# Patient Record
Sex: Male | Born: 1955 | Hispanic: Yes | Marital: Married
Health system: Southern US, Academic
[De-identification: ages and names within clinical notes are randomized; demographics above are authoritative.]

## PROBLEM LIST (undated history)

## (undated) ENCOUNTER — Encounter

## (undated) ENCOUNTER — Telehealth

## (undated) ENCOUNTER — Ambulatory Visit: Payer: MEDICARE | Attending: Registered" | Primary: Registered"

## (undated) ENCOUNTER — Ambulatory Visit

## (undated) ENCOUNTER — Ambulatory Visit: Payer: MEDICARE | Attending: Nephrology | Primary: Nephrology

## (undated) ENCOUNTER — Encounter: Attending: Physician Assistant | Primary: Physician Assistant

## (undated) ENCOUNTER — Encounter: Attending: Nephrology | Primary: Nephrology

## (undated) ENCOUNTER — Ambulatory Visit: Payer: MEDICARE

## (undated) ENCOUNTER — Telehealth: Payer: MEDICARE | Attending: Nephrology | Primary: Nephrology

## (undated) ENCOUNTER — Encounter
Attending: Pharmacist Clinician (PhC)/ Clinical Pharmacy Specialist | Primary: Pharmacist Clinician (PhC)/ Clinical Pharmacy Specialist

## (undated) ENCOUNTER — Institutional Professional Consult (permissible substitution): Payer: MEDICARE | Attending: Nephrology | Primary: Nephrology

## (undated) ENCOUNTER — Telehealth: Attending: Internal Medicine | Primary: Internal Medicine

## (undated) ENCOUNTER — Telehealth: Attending: Nephrology | Primary: Nephrology

## (undated) DIAGNOSIS — H409 Unspecified glaucoma: Secondary | ICD-10-CM

## (undated) DIAGNOSIS — E7800 Pure hypercholesterolemia, unspecified: Secondary | ICD-10-CM

## (undated) DIAGNOSIS — I10 Essential (primary) hypertension: Secondary | ICD-10-CM

## (undated) DIAGNOSIS — M109 Gout, unspecified: Secondary | ICD-10-CM

## (undated) DIAGNOSIS — D631 Anemia in chronic kidney disease: Secondary | ICD-10-CM

## (undated) DIAGNOSIS — N189 Chronic kidney disease, unspecified: Secondary | ICD-10-CM

## (undated) DIAGNOSIS — N2889 Other specified disorders of kidney and ureter: Secondary | ICD-10-CM

## (undated) DIAGNOSIS — N186 End stage renal disease: Secondary | ICD-10-CM

## (undated) HISTORY — DX: Chronic kidney disease, unspecified: N18.9

## (undated) HISTORY — DX: Other specified disorders of kidney and ureter: N28.89

## (undated) HISTORY — DX: Chronic kidney disease, unspecified: D63.1

## (undated) HISTORY — DX: Unspecified glaucoma: H40.9

## (undated) HISTORY — DX: Gout, unspecified: M10.9

## (undated) HISTORY — DX: End stage renal disease: N18.6

## (undated) HISTORY — PX: NEPHRECTOMY: SHX65

## (undated) HISTORY — PX: OTHER SURGICAL HISTORY: SHX169

## (undated) HISTORY — PX: KIDNEY TRANSPLANT: SHX239

## (undated) HISTORY — DX: Essential (primary) hypertension: I10

## (undated) MED ORDER — COMBIGAN 0.2 %-0.5 % EYE DROPS: Freq: Two times a day (BID) | OPHTHALMIC | 0 days

## (undated) MED ORDER — LATANOPROST 0.005 % EYE DROPS: Freq: Every evening | OPHTHALMIC | 0.00000 days

---
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
## 2004-07-09 HISTORY — PX: CARDIAC CATHETERIZATION: SHX172

## 2004-07-20 ENCOUNTER — Other Ambulatory Visit: Payer: Self-pay

## 2004-07-20 ENCOUNTER — Emergency Department: Payer: Self-pay | Admitting: Emergency Medicine

## 2004-07-28 ENCOUNTER — Ambulatory Visit: Payer: Self-pay | Admitting: Cardiovascular Disease

## 2006-02-12 ENCOUNTER — Emergency Department: Payer: Self-pay | Admitting: Emergency Medicine

## 2006-07-27 ENCOUNTER — Emergency Department: Payer: Self-pay | Admitting: Emergency Medicine

## 2006-11-28 ENCOUNTER — Ambulatory Visit: Payer: Self-pay | Admitting: Family Medicine

## 2007-11-02 ENCOUNTER — Emergency Department: Payer: Self-pay | Admitting: Emergency Medicine

## 2007-11-05 ENCOUNTER — Encounter: Payer: Self-pay | Admitting: Emergency Medicine

## 2007-11-07 ENCOUNTER — Encounter: Payer: Self-pay | Admitting: Emergency Medicine

## 2008-02-09 ENCOUNTER — Emergency Department: Payer: Self-pay | Admitting: Internal Medicine

## 2008-04-22 ENCOUNTER — Emergency Department: Payer: Self-pay | Admitting: Emergency Medicine

## 2008-05-04 ENCOUNTER — Emergency Department: Payer: Self-pay | Admitting: Emergency Medicine

## 2009-06-04 ENCOUNTER — Emergency Department: Payer: Self-pay | Admitting: Unknown Physician Specialty

## 2009-07-18 ENCOUNTER — Emergency Department: Payer: Self-pay | Admitting: Emergency Medicine

## 2010-09-07 ENCOUNTER — Inpatient Hospital Stay: Payer: Self-pay | Admitting: Internal Medicine

## 2010-09-25 ENCOUNTER — Ambulatory Visit: Payer: Self-pay | Admitting: Vascular Surgery

## 2010-09-27 ENCOUNTER — Ambulatory Visit: Payer: Self-pay | Admitting: Vascular Surgery

## 2010-11-02 ENCOUNTER — Emergency Department: Payer: Self-pay | Admitting: Emergency Medicine

## 2010-12-01 ENCOUNTER — Ambulatory Visit: Payer: Self-pay | Admitting: Vascular Surgery

## 2010-12-31 ENCOUNTER — Emergency Department: Payer: Self-pay | Admitting: Emergency Medicine

## 2011-01-01 ENCOUNTER — Emergency Department: Payer: Self-pay | Admitting: Emergency Medicine

## 2011-02-19 ENCOUNTER — Ambulatory Visit: Payer: Self-pay | Admitting: Vascular Surgery

## 2012-01-04 ENCOUNTER — Ambulatory Visit: Payer: Self-pay | Admitting: Vascular Surgery

## 2012-03-12 ENCOUNTER — Ambulatory Visit: Payer: Self-pay | Admitting: Vascular Surgery

## 2012-04-07 ENCOUNTER — Ambulatory Visit: Payer: Self-pay | Admitting: Vascular Surgery

## 2012-06-17 ENCOUNTER — Observation Stay: Payer: Self-pay | Admitting: Internal Medicine

## 2012-06-17 LAB — COMPREHENSIVE METABOLIC PANEL
Albumin: 3.6 g/dL (ref 3.4–5.0)
Alkaline Phosphatase: 135 U/L (ref 50–136)
Calcium, Total: 7.7 mg/dL — ABNORMAL LOW (ref 8.5–10.1)
Co2: 22 mmol/L (ref 21–32)
Glucose: 97 mg/dL (ref 65–99)
Osmolality: 299 (ref 275–301)
Potassium: 5.1 mmol/L (ref 3.5–5.1)
SGOT(AST): 27 U/L (ref 15–37)
Sodium: 140 mmol/L (ref 136–145)

## 2012-06-17 LAB — CK TOTAL AND CKMB (NOT AT ARMC)
CK, Total: 294 U/L — ABNORMAL HIGH (ref 35–232)
CK, Total: 263 U/L — ABNORMAL HIGH (ref 35–232)
CK-MB: 1.6 ng/mL (ref 0.5–3.6)
CK-MB: 0.7 ng/mL (ref 0.5–3.6)

## 2012-06-17 LAB — URINALYSIS, COMPLETE
Protein: 100
Specific Gravity: 1.009 (ref 1.003–1.030)

## 2012-06-17 LAB — CBC WITH DIFFERENTIAL/PLATELET
Basophil #: 0.1 10*3/uL (ref 0.0–0.1)
Eosinophil %: 5.6 %
HCT: 20.5 % — ABNORMAL LOW (ref 40.0–52.0)
HGB: 7 g/dL — ABNORMAL LOW (ref 13.0–18.0)
Lymphocyte #: 1 10*3/uL (ref 1.0–3.6)
MCH: 32.5 pg (ref 26.0–34.0)
MCV: 95 fL (ref 80–100)
Monocyte #: 0.4 x10 3/mm (ref 0.2–1.0)
Monocyte %: 4.4 %
Neutrophil #: 6.4 10*3/uL (ref 1.4–6.5)
RDW: 13.4 % (ref 11.5–14.5)

## 2012-06-17 LAB — TROPONIN I: Troponin-I: <0.02 ng/mL

## 2012-06-17 LAB — PROTIME-INR: Prothrombin Time: 12.9 secs (ref 11.5–14.7)

## 2012-06-17 LAB — PHOSPHORUS: Phosphorus: 3.3 mg/dL (ref 2.5–4.9)

## 2012-06-18 LAB — CBC WITH DIFFERENTIAL/PLATELET
Basophil #: 0.1 10*3/uL (ref 0.0–0.1)
Eosinophil #: 0.4 10*3/uL (ref 0.0–0.7)
Eosinophil %: 6.1 %
HGB: 8.2 g/dL — ABNORMAL LOW (ref 13.0–18.0)
Lymphocyte #: 1.1 10*3/uL (ref 1.0–3.6)
Lymphocyte %: 15.9 %
MCHC: 36.7 g/dL — ABNORMAL HIGH (ref 32.0–36.0)
MCV: 93 fL (ref 80–100)
Monocyte %: 8.1 %
Neutrophil %: 69 %
Platelet: 146 10*3/uL — ABNORMAL LOW (ref 150–440)
RDW: 14.5 % (ref 11.5–14.5)
WBC: 7.2 10*3/uL (ref 3.8–10.6)

## 2012-06-18 LAB — BASIC METABOLIC PANEL
Anion Gap: 9 (ref 7–16)
BUN: 36 mg/dL — ABNORMAL HIGH (ref 7–18)
Calcium, Total: 7.6 mg/dL — ABNORMAL LOW (ref 8.5–10.1)
Chloride: 100 mmol/L (ref 98–107)
Co2: 29 mmol/L (ref 21–32)
Creatinine: 8.18 mg/dL — ABNORMAL HIGH (ref 0.60–1.30)
Glucose: 99 mg/dL (ref 65–99)
Potassium: 4.2 mmol/L (ref 3.5–5.1)

## 2012-06-19 ENCOUNTER — Encounter: Payer: Self-pay | Admitting: *Deleted

## 2012-06-20 ENCOUNTER — Ambulatory Visit: Payer: Self-pay | Admitting: Cardiovascular Disease

## 2012-10-31 ENCOUNTER — Ambulatory Visit: Payer: Self-pay | Admitting: Family Medicine

## 2012-12-04 ENCOUNTER — Inpatient Hospital Stay: Payer: Self-pay | Admitting: Internal Medicine

## 2012-12-04 DIAGNOSIS — I4892 Unspecified atrial flutter: Secondary | ICD-10-CM

## 2012-12-04 DIAGNOSIS — I5043 Acute on chronic combined systolic (congestive) and diastolic (congestive) heart failure: Secondary | ICD-10-CM

## 2012-12-04 LAB — CBC
HCT: 23 % — ABNORMAL LOW (ref 40.0–52.0)
MCH: 32.4 pg (ref 26.0–34.0)
MCV: 97 fL (ref 80–100)
Platelet: 149 10*3/uL — ABNORMAL LOW (ref 150–440)
RDW: 14 % (ref 11.5–14.5)
WBC: 6.4 10*3/uL (ref 3.8–10.6)

## 2012-12-04 LAB — PROTIME-INR
INR: 1
Prothrombin Time: 13.4 secs (ref 11.5–14.7)

## 2012-12-04 LAB — COMPREHENSIVE METABOLIC PANEL
Alkaline Phosphatase: 118 U/L (ref 50–136)
Anion Gap: 8 (ref 7–16)
BUN: 37 mg/dL — ABNORMAL HIGH (ref 7–18)
Co2: 26 mmol/L (ref 21–32)
Creatinine: 6.45 mg/dL — ABNORMAL HIGH (ref 0.60–1.30)
Glucose: 112 mg/dL — ABNORMAL HIGH (ref 65–99)
Osmolality: 285 (ref 275–301)
Potassium: 4.3 mmol/L (ref 3.5–5.1)
SGOT(AST): 38 U/L — ABNORMAL HIGH (ref 15–37)
Sodium: 138 mmol/L (ref 136–145)
Total Protein: 6.4 g/dL (ref 6.4–8.2)

## 2012-12-04 LAB — TROPONIN I
Troponin-I: 0.06 ng/mL — ABNORMAL HIGH
Troponin-I: 0.12 ng/mL — ABNORMAL HIGH

## 2012-12-04 LAB — APTT: Activated PTT: 52.1 secs — ABNORMAL HIGH (ref 23.6–35.9)

## 2012-12-04 LAB — CK TOTAL AND CKMB (NOT AT ARMC)
CK, Total: 209 U/L (ref 35–232)
CK-MB: 1.6 ng/mL (ref 0.5–3.6)

## 2012-12-04 LAB — TSH: Thyroid Stimulating Horm: 1.62 u[IU]/mL

## 2012-12-04 LAB — CK-MB: CK-MB: 1.3 ng/mL (ref 0.5–3.6)

## 2012-12-05 DIAGNOSIS — I4892 Unspecified atrial flutter: Secondary | ICD-10-CM

## 2012-12-05 DIAGNOSIS — I059 Rheumatic mitral valve disease, unspecified: Secondary | ICD-10-CM

## 2012-12-05 LAB — CBC WITH DIFFERENTIAL/PLATELET
Basophil #: 0.1 10*3/uL (ref 0.0–0.1)
Basophil %: 1.7 %
Eosinophil #: 0.4 10*3/uL (ref 0.0–0.7)
Eosinophil %: 5.3 %
HCT: 21.4 % — ABNORMAL LOW (ref 40.0–52.0)
HGB: 7.5 g/dL — ABNORMAL LOW (ref 13.0–18.0)
MCH: 33.6 pg (ref 26.0–34.0)
MCV: 96 fL (ref 80–100)
Monocyte #: 0.5 x10 3/mm (ref 0.2–1.0)
Monocyte %: 6.8 %
Neutrophil %: 68.1 %
Platelet: 144 10*3/uL — ABNORMAL LOW (ref 150–440)
RBC: 2.22 10*6/uL — ABNORMAL LOW (ref 4.40–5.90)
WBC: 6.8 10*3/uL (ref 3.8–10.6)

## 2012-12-05 LAB — BASIC METABOLIC PANEL
Chloride: 104 mmol/L (ref 98–107)
Creatinine: 8.92 mg/dL — ABNORMAL HIGH (ref 0.60–1.30)
EGFR (African American): 7 — ABNORMAL LOW
EGFR (Non-African Amer.): 6 — ABNORMAL LOW
Glucose: 91 mg/dL (ref 65–99)
Osmolality: 286 (ref 275–301)

## 2012-12-05 LAB — APTT
Activated PTT: 64.2 secs — ABNORMAL HIGH (ref 23.6–35.9)
Activated PTT: 87.9 secs — ABNORMAL HIGH (ref 23.6–35.9)

## 2012-12-05 LAB — CK-MB: CK-MB: 1.2 ng/mL (ref 0.5–3.6)

## 2012-12-05 LAB — MAGNESIUM: Magnesium: 1.8 mg/dL

## 2012-12-05 LAB — TROPONIN I: Troponin-I: 0.12 ng/mL — ABNORMAL HIGH

## 2012-12-06 LAB — PROTIME-INR
INR: 1.1
Prothrombin Time: 14.4 secs (ref 11.5–14.7)

## 2012-12-06 LAB — CBC WITH DIFFERENTIAL/PLATELET
Basophil #: 0.1 10*3/uL (ref 0.0–0.1)
Basophil %: 1.3 %
Eosinophil #: 0.5 10*3/uL (ref 0.0–0.7)
Eosinophil %: 5 %
HGB: 8.4 g/dL — ABNORMAL LOW (ref 13.0–18.0)
MCHC: 34.7 g/dL (ref 32.0–36.0)
MCV: 97 fL (ref 80–100)
Monocyte #: 0.5 x10 3/mm (ref 0.2–1.0)
Monocyte %: 5.4 %
Platelet: 171 10*3/uL (ref 150–440)
RDW: 13.8 % (ref 11.5–14.5)

## 2012-12-06 LAB — APTT
Activated PTT: 114.7 secs — ABNORMAL HIGH (ref 23.6–35.9)
Activated PTT: 87.9 secs — ABNORMAL HIGH (ref 23.6–35.9)

## 2012-12-07 LAB — APTT: Activated PTT: 75.9 secs — ABNORMAL HIGH (ref 23.6–35.9)

## 2012-12-07 LAB — BASIC METABOLIC PANEL
Anion Gap: 7 (ref 7–16)
Calcium, Total: 6.9 mg/dL — CL (ref 8.5–10.1)
Creatinine: 7.21 mg/dL — ABNORMAL HIGH (ref 0.60–1.30)
Osmolality: 280 (ref 275–301)
Potassium: 4.4 mmol/L (ref 3.5–5.1)
Sodium: 137 mmol/L (ref 136–145)

## 2012-12-30 ENCOUNTER — Encounter: Payer: Self-pay | Admitting: *Deleted

## 2012-12-31 ENCOUNTER — Encounter: Payer: Self-pay | Admitting: *Deleted

## 2013-01-12 ENCOUNTER — Encounter: Payer: Self-pay | Admitting: *Deleted

## 2013-01-12 ENCOUNTER — Ambulatory Visit: Payer: Medicare Other | Admitting: Cardiovascular Disease

## 2013-08-12 IMAGING — CR DG CHEST 1V PORT
1 series · 2 of 2 positions shown · non-contrast
Comparison: none

REASON FOR EXAM: Chest Pain
COMMENTS:

[Series 1: ap · 0.17mm/px · 2 of 2 slices shown]
[im 1/2]
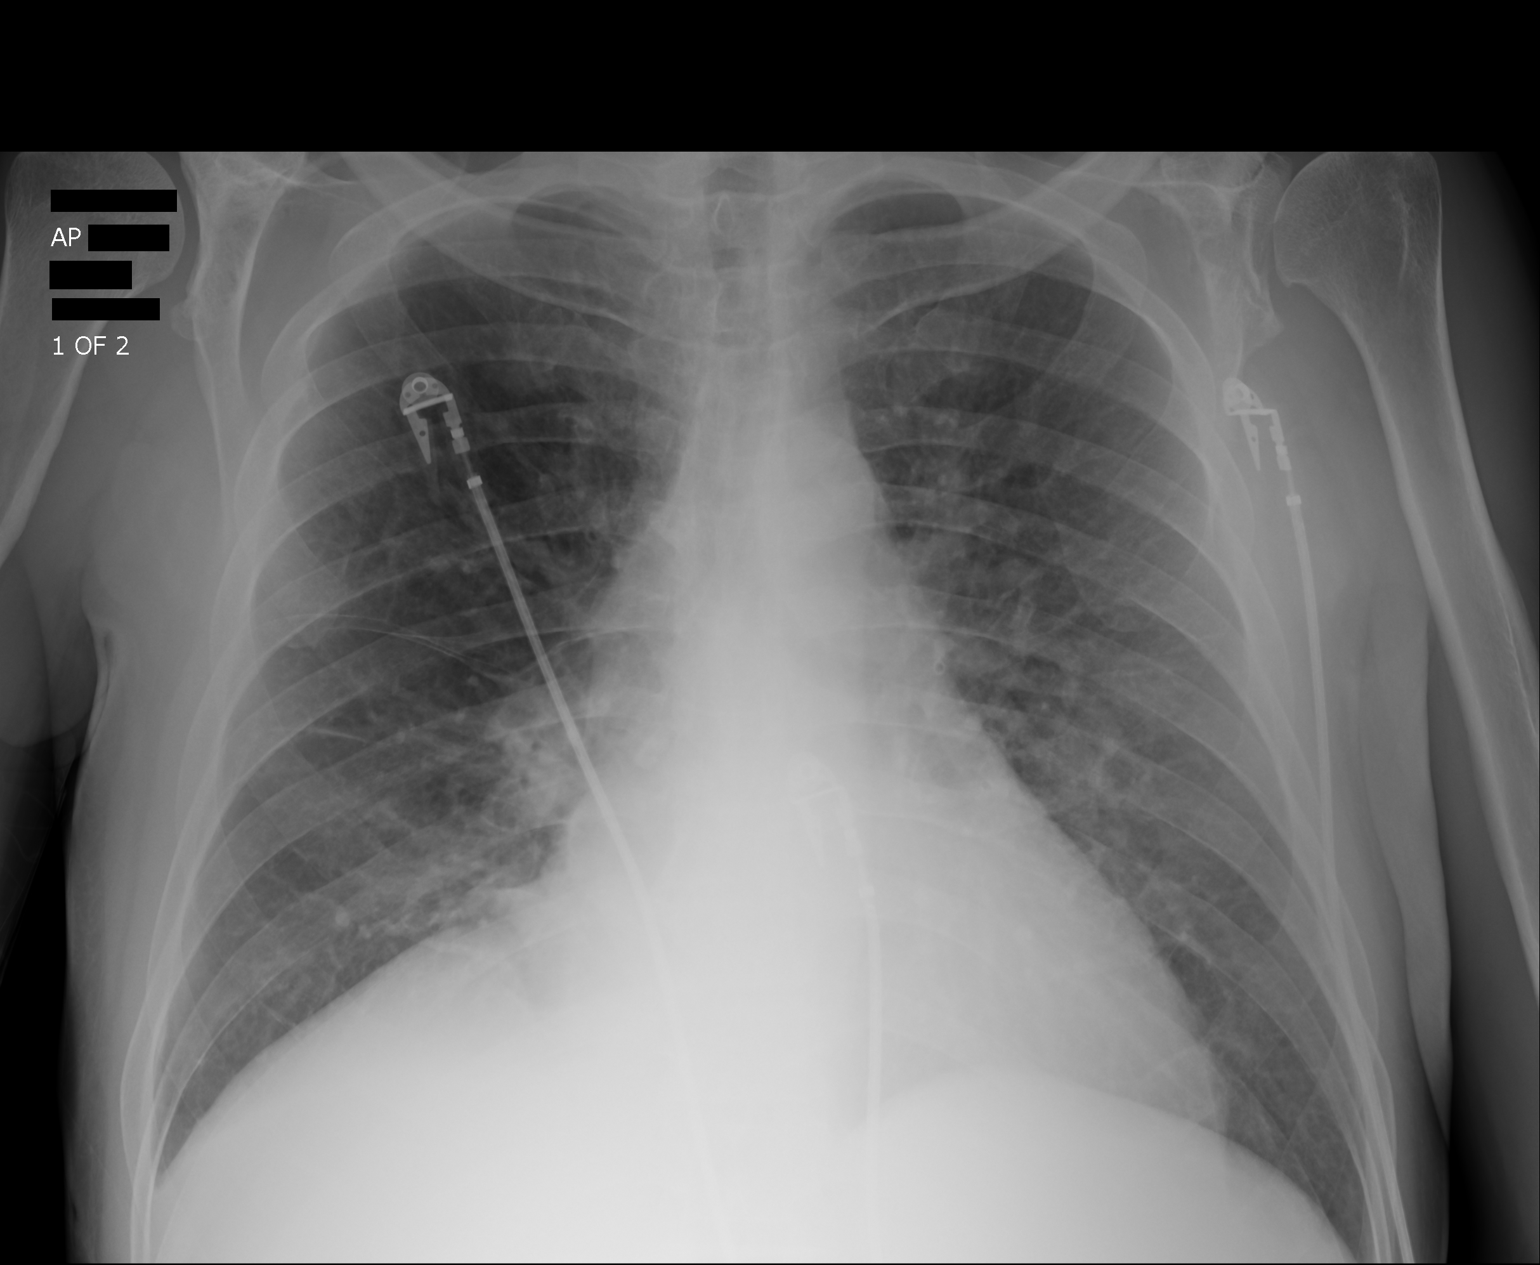
[im 2/2]
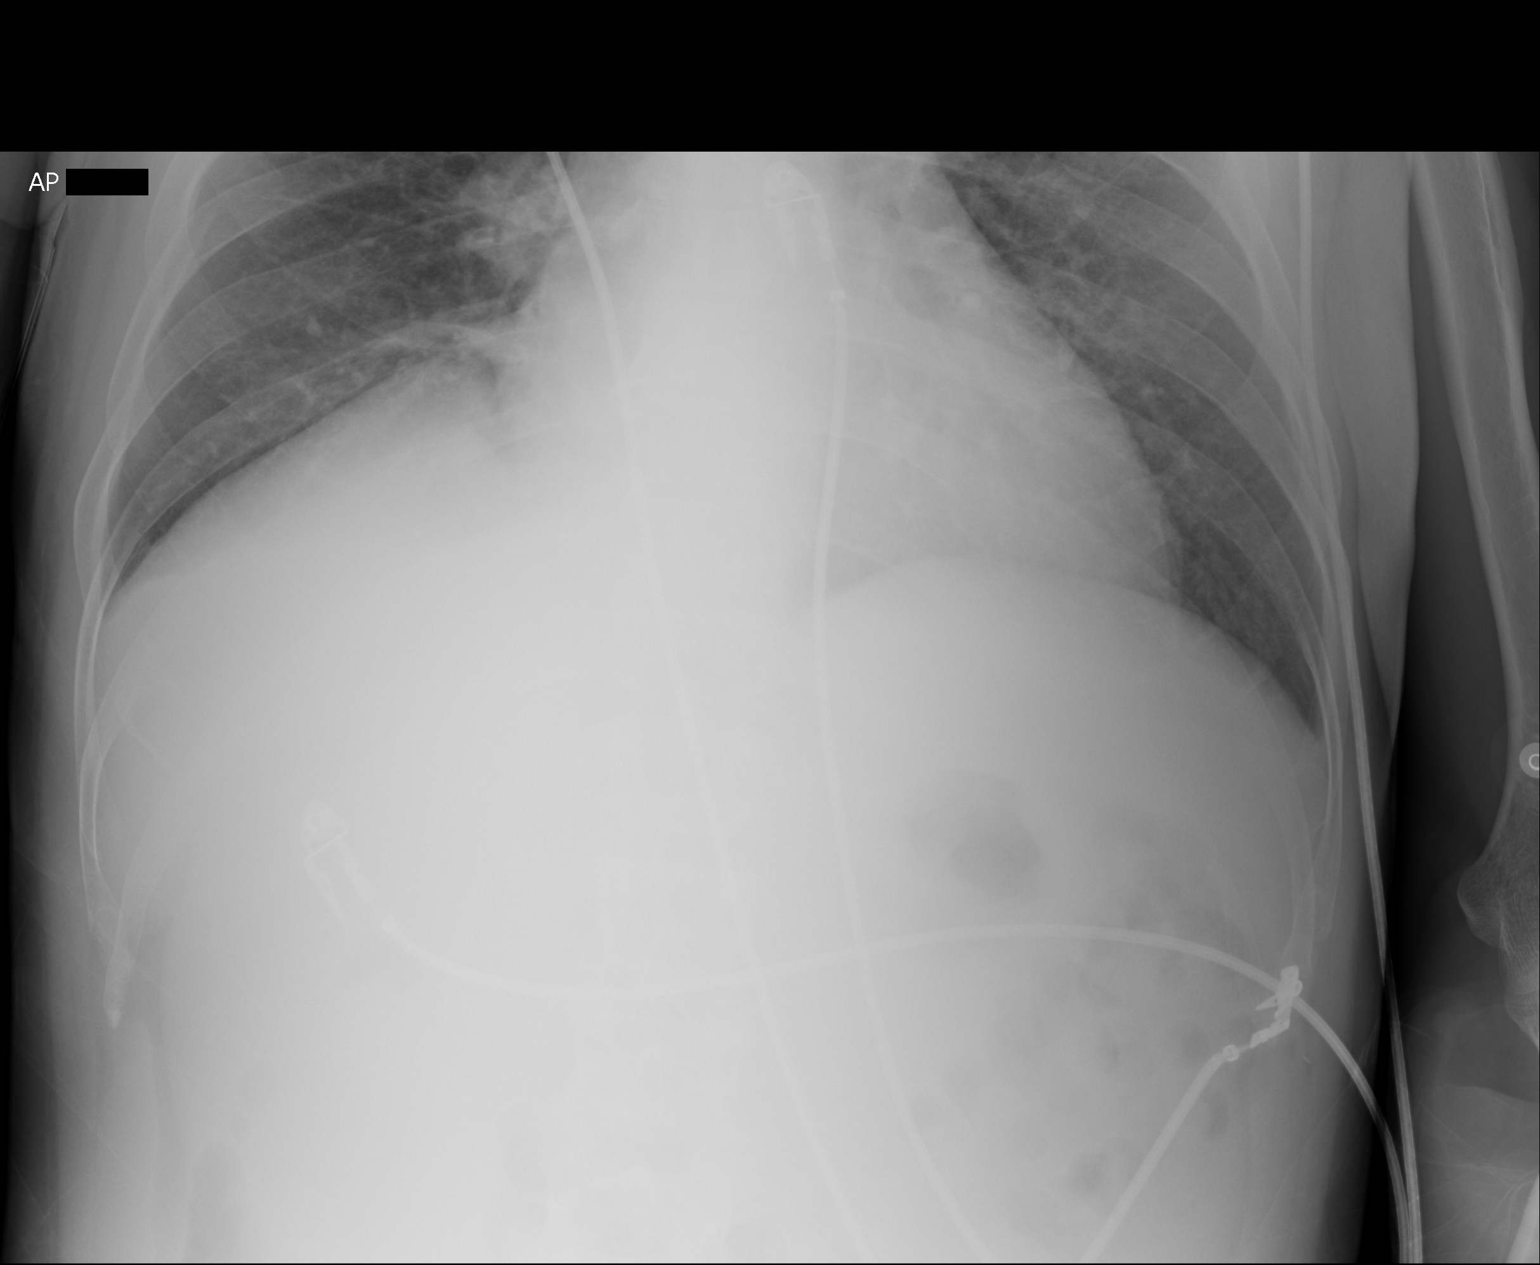

[2 of 2 positions shown; findings below may reference images not displayed]

PROCEDURE:     DXR - DXR PORTABLE CHEST SINGLE VIEW  - December 04, 2012 [DATE]

RESULT:     Comparison is made to the previous study of 10/31/2012. The
cardiac silhouette is normal. There is no edema, infiltrate, effusion, mass
or pneumothorax. There is no evidence of blunting of the costophrenic angles
to suggest effusion.
IMPRESSION: 1. No evidence of pulmonary edema or pleural effusion. No acute
cardiopulmonary disease evident.

[REDACTED]

## 2014-09-03 ENCOUNTER — Ambulatory Visit: Payer: Self-pay | Admitting: Family Medicine

## 2014-10-26 NOTE — Op Note (Signed)
PATIENT NAME:  Christopher Hickman, Christopher Hickman MR#:  [id number] DATE OF BIRTH:  1955-07-15  DATE OF PROCEDURE:  03/12/2012  PREOPERATIVE DIAGNOSES:  1. End-stage renal disease.  2. Poorly functioning left arm AV fistula.   POSTOPERATIVE DIAGNOSES: 1. End-stage renal disease.  2. Poorly functioning left arm AV fistula.   PROCEDURES:  1. Ultrasound guidance for vascular access to left arm AV fistula.  2. Left upper extremity fistulogram.  3. Percutaneous transluminal angioplasty of peri anastomotic stenosis with 6 and 7 mm diameter angioplasty balloon.   SURGEON: [name], MD   ANESTHESIA: Local with moderate conscious sedation.   ESTIMATED BLOOD LOSS: Minimal.   FLUOROSCOPY TIME: Approximately two minutes.   CONTRAST USED: 15 mL.   INDICATION FOR PROCEDURE: This is a 59 year old Hispanic male with end-stage renal disease. His fistula flows have been down recently. We are asked to evaluate this with fistulogram.   DESCRIPTION OF PROCEDURE: The patient is brought to the Vascular Interventional Radiology Suite. Left upper extremity was sterilely prepped and draped and a sterile surgical field was created. The fistula was accessed just below the antecubital fossa in a retrograde direction without difficulty with a micropuncture needle and permanent image was recorded. Micropuncture wire and sheath were placed and we upsized to a 6 [place] sheath. A Kumpe catheter was placed at the anastomosis which showed a moderate stenosis in the 50 to 60% range just beyond the anastomosis. The remainder of the fistula appeared patent. There were small to medium-sized branches in two locations in the forearm that connected to a single outflow vein that were likely limiting some of the flow, however, from a retrograde direction the orientation was not ideal for treatment of these today and these would be better treated from an access near the anastomosis. I elected to treat the stenotic area and 6 and 7 mm diameter  angioplasty balloons were used and following angioplasty there was a mild residual stenosis of less than 30% and I elected to terminate the procedure. The sheath was removed around a 4-0 Monocryl purse-string suture. Pressure was held. Sterile dressing was placed. The patient tolerated the procedure well and was taken to the recovery room in stable condition.    ____________________________ [name], MD jsd:drc D: 03/12/2012 10:52:10 ET T: 03/12/2012 11:23:49 ET JOB#: [id number]  cc: [name], MD, <Dictator> [name] MD ELECTRONICALLY SIGNED 03/16/2012 14:41

## 2014-10-26 NOTE — H&P (Signed)
PATIENT NAME:  Christopher Hickman, Christopher Hickman MR#:  [id number] DATE OF BIRTH:  05-11-1956  DATE OF ADMISSION:  06/17/2012  REFERRING PHYSICIAN: Dr. Marjean Donna   PRIMARY CARE PHYSICIAN: [facility]    PRIMARY NEPHROLOGIST: Dr. Murlean Iba   CHIEF COMPLAINT: Chest pain and cough.   The patient is Spanish-speaking only. History and HPI was obtained with the presence of Spanish translator, Kennyth Lose.   HISTORY OF PRESENT ILLNESS: This is a 59 year old male who has end-stage renal disease on hemodialysis Tuesday, Thursday, Saturday, started dialysis last year. As well, he has history of hypertension. The patient presents with complaint of cough over the last month. The patient reports he has been having nonproductive cough with shortness of breath over the last month and he reports cough was so significant he started to have chest pain associated with cough. As well, he reports shortness of breath. The patient reports he has been compliant with diet and medication and with his hemodialysis as well. In the ED the patient had chest x-ray which did show evidence of volume overload but no infiltrate. The patient was afebrile, has no leukocytosis. He has been describing his chest pain as pleuritic in nature, worsened by cough. The patient's EKG did not show any significant ST or T wave changes. As well, the patient's first set of troponin was negative. Hospitalist service was requested to admit the patient for further management of his symptoms and work-up. As well, the patient was found to have hemoglobin of 7. Unfortunately, his baseline is unknown to [place] but the patient reports he has always been he's anemic where he has required blood transfusions in the past. The patient had Hemoccult/stool guaiac done by me in the ED which was negative. Discussed with nephrologist, Dr. Holley Raring, on call who reports he has a history of anemia but cannot recall his most recent hemoglobin at this point and he will  follow with that. He is agreeing to one unit packed red blood cells to be transfused during his hemodialysis.   PAST MEDICAL HISTORY:  1. End-stage renal disease, on hemodialysis Tuesday, Thursday, Saturday.  2. Hypertension.  3. Gout.   PAST SURGICAL HISTORY:  1. History of left arm AV graft.  2. Shoulder surgery.   SOCIAL HISTORY: The patient lives with family. No alcohol, tobacco, or recreational drug use.   FAMILY HISTORY: Significant for diabetes.   ALLERGIES: Codeine causes generalized swelling.   MEDICATIONS:  1. Norvasc.  2. Rena-Vite.  3. Sensipar.  REVIEW OF SYSTEMS: Denies any fever, chills, fatigue, and weakness. EYES: Denies blurry vision, double vision, or pain. ENT: Denies tinnitus, ear pain, hearing loss, or epistaxis. RESPIRATORY: Complains of cough, nonproductive. Denies any wheezing, hemoptysis. Has mild dyspnea. CARDIOVASCULAR: Complains of pleuritic chest pain reproduced by cough and deep inspiration. Denies any edema, palpitations, syncope, or arrhythmia. GI: Denies nausea, vomiting, diarrhea, abdominal pain, hematemesis. GU: Denies dysuria, hematuria, renal colic. ENDOCRINE: Denies polyuria, polydipsia, heat or cold intolerance. INTEGUMENTARY: Denies acne, rash, or skin lesions. HEMATOLOGY: Reports history of anemia with multiple transfusions in the past. Denies easy bruising or bleeding diathesis. NEUROLOGIC: Denies numbness, weakness, dysarthria, epilepsy, tremors, vertigo. MUSCULOSKELETAL: Denies neck pain, shoulder pain, knee pain, arthritis. PSYCH: Denies any anxiety, insomnia, schizophrenia, nervousness, or bipolar disorder.   PERTINENT LABS: Glucose 97, BUN 69, creatinine 12.03, sodium 140, potassium 5.1, chloride 105, CO2 22. Troponin less than 0.02. White blood cells 8.3, hemoglobin 7, hematocrit 20.5, platelets 147. INR 0.9.   EKG showing no ST or  T wave changes.   ASSESSMENT AND PLAN: This is a 59 year old male with known history of end-stage renal  disease who presents with atypical chest pain most likely pleuritic related to cough. As well, he complains of cough over the last month, nonproductive. 1. Chest pain, atypical, pleuritic quality related to cough reproducible by palpation. Will cycle troponins. Has no EKG changes.  2. End-stage renal disease, on hemodialysis Tuesday, Thursday, Saturday. Discussed with Dr. Holley Raring who will hemodialyze today.  3. Anemia, most likely anemia of end-stage renal disease. The patient is Hemoccult negative. Will be transfused 1 unit during hemodialysis today. Discussed with Dr. Holley Raring and he will evaluate if needs Procrit.  4. Bronchitis. Will start the patient on Rocephin.  5. Hypertension, uncontrolled, but this most likely will improve after hemodialysis today. Will continue him on home dose Norvasc.  6. Shortness of breath. This is due to volume overload as patient had evidence of volume overload on chest x-ray. The patient will get hemodialysis today.  7. DVT prophylaxis. Will start on sub-Q heparin.   CODE STATUS: The patient is FULL CODE.   TOTAL TIME SPENT ON ADMISSION AND PATIENT CARE: 55 minutes.   ____________________________ Albertine Patricia, MD dse:drc D: 06/17/2012 07:47:46 ET T: 06/17/2012 09:29:49 ET JOB#: [id number]  cc: Albertine Patricia, MD, <Dictator> [place] Maryjo Ragon Graciela Husbands MD ELECTRONICALLY SIGNED 06/20/2012 2:44

## 2014-10-26 NOTE — Op Note (Signed)
PATIENT NAME:  Christopher Hickman, Christopher Hickman MR#:  [id number] DATE OF BIRTH:  02/25/1956  DATE OF PROCEDURE:  04/07/2012  PREOPERATIVE DIAGNOSES:  1. Endstage renal disease.  2. Decreased flow in the left arm AV fistula.  3. Hypertension.   POSTOPERATIVE DIAGNOSES:  1. Endstage renal disease.  2. Decreased flow in the left arm AV fistula.  3. Hypertension.   PROCEDURE:  1. Ultrasound guidance for vascular access, left arm AV fistula.  2. Left upper extremity fistulogram and central venogram.  3. Coil embolization of large forearm fistula branch with 8- and 10-mm coils.    SURGEON: [name], M.D.   ANESTHESIA: Local with moderate conscious sedation.   ESTIMATED BLOOD LOSS: Minimal.   FLUOROSCOPY TIME: Four minutes.   CONTRAST USED: 35 mL.   INDICATION FOR PROCEDURE: This is a gentleman on dialysis well known to our practice. He has had decrease in his flows on dialysis of about 40%, and we are asked to assess this with a fistulogram. The risks and benefits were discussed. Informed consent was obtained.   DESCRIPTION OF PROCEDURE: The patient is brought to the vascular and interventional radiology suite. The left upper extremity was sterilely prepped and draped and a sterile surgical field was created. The fistula was accessed a few centimeters beyond the anastomosis with a micropuncture needle under direct ultrasound guidance. A permanent image was recorded. Micropuncture wire and sheath were then placed. Imaging performed through the micropuncture sheath showed a large branch in the mid forearm that was stealing a significant amount of flow from the fistula with the fistula otherwise being patent.  There was some mild irregularity in the cephalic vein at the confluence of the subclavian vein, but there was dual outflow through the deep venous system in the cephalic vein in the upper arm so this was not really consequential. I gave 2500 units of intravenous heparin for systemic  anticoagulation. I placed a 6 [place] sheath and then used a Kumpe catheter and a glidewire to cannulate this large branch without significant difficulty, confirmed successful cannulation with the catheter in the branch, and then deployed an 8- and two 10-mm coils to the branch with resultant good occlusion of the branch and improved flow through the fistula. At this point I elected to terminate the procedure. The diagnostic catheter was removed. The sheath was removed around a 4-0 Monocryl pursestring suture. Pressure was held. Sterile dressing was placed. The patient tolerated the procedure well and was taken to the recovery room in stable condition.     ____________________________ [name], MD jsd:bjt D: 04/07/2012 13:36:20 ET T: 04/07/2012 14:51:56 ET JOB#: [id number]  cc: [name], MD, <Dictator> Murlean Iba, MD [name] MD ELECTRONICALLY SIGNED 04/08/2012 13:59

## 2014-10-29 NOTE — Discharge Summary (Signed)
PATIENT NAME:  Christopher Hickman, Christopher Hickman MR#:  [id number] DATE OF BIRTH:  1956/03/24  DATE OF ADMISSION:  06/17/2012 DATE OF DISCHARGE:  06/18/2012   PRESENTING COMPLAINT: Chest pain and cough.   DISCHARGE DIAGNOSES:  1. Chest pain, appears atypical, likely due to congestion.  2. Shortness of breath due to volume overload, stable.  3. End-stage renal disease, on hemodialysis.  4. Hypertension.   CONDITION ON DISCHARGE: Fair.   MEDICATIONS:  1. Amlodipine 5 mg daily.  2. [place]-Vite 1 tablet daily.  3. Sensipar 30 mg daily.  4. Vitamin B Complex with C and folic acid daily.   FOLLOW-UP:  1. Follow-up with Dr. Fletcher Anon on 06/20/2012 at 10:45 a.m.  2. Follow-up with Dr. Holley Raring on your regular scheduled dialysis appointment.   LABORATORY, DIAGNOSTIC, AND RADIOLOGICAL DATA: Cardiac enzymes x3 negative. White count 8.2, hemoglobin and hematocrit 8.2 and 22.4, platelet count 146, glucose 99, sodium 138, potassium 4.2. Cardiac enzymes x3 negative.   Ultrasound kidneys small cyst in the left kidney. No solid mass identified.   Chest x-ray findings suggestive of interstitial pulmonary edema.   EKG shows sinus tachycardia. T wave inversion no longer evident.   BRIEF SUMMARY OF HOSPITAL COURSE: Mr. Diona Browner is a 59 year old Hispanic gentleman with history of hypertension and end-stage renal disease who came in with atypical chest pain, most likely pleuritic, secondary to cough. He was admitted with:  1. Chest pain, atypical, likely related to cough, nonproductive. The patient has no EKG changes. Cardiac enzymes are negative. No fever or elevated white count. His antibiotics were discontinued. His chest x-ray showed some volume overload, however, he underwent ultrafiltration at dialysis yesterday. About 1900 mL was removed. He sats are more than 92% on room air. The patient will follow-up with Dr. Fletcher Anon as outpatient on December 13th for chest pain symptoms.  2. End-stage renal disease, on hemodialysis.  His hemodialysis was resumed. Dr. Holley Raring saw the patient while in-house.  3. Anemia likely from end-stage renal disease. He received 1 unit of blood transfusion. He is Hemoccult negative.  4. Shortness of breath was due to volume overload as seen on chest x-ray and symptoms of cough. He had ultrafiltration of 1900 mL. His sats were good on room air. He did not have any other symptoms.  5. Hypertension. Amlodipine was continued.   Hospital stay otherwise remained stable.   CODE STATUS: The patient remained a FULL CODE.   TIME SPENT: 40 minutes.   ____________________________ [name], MD sap:drc D: 06/18/2012 13:44:47 ET T: 06/18/2012 13:52:01 ET JOB#: [id number]  cc: Naraly Fritcher A. Posey Pronto, MD, <Dictator> Muhammad A. Fletcher Anon, MD Tama High, MD Ilda Basset MD ELECTRONICALLY SIGNED 07/09/2012 10:33

## 2014-10-29 NOTE — Consult Note (Signed)
General Aspect 59 year old Hispanic gentleman with history of end-stage renal disease on hemodialysis, a history of gout, hypertension, glaucoma, depression and anxiety. He comes to the Emergency Room from dialysis after his heart rate was found to be in the 140s, rapid a-fib 1-1/2 hours into hemodialysis.   He reports on Tuesday dialysis, he was having no problems. Vitals during HD were " normal" by his report. Today, on arrival pulse rate was 110. They started HD and rate increased to 140s. Anemia has been a provblem before, managed by renal MD at [facility]. Chronic cough for 7 months, much worse today.   The patient denies any chest pain, does have some intermittent mild chest tightness. Denies any shortness of breath, dizziness or syncopal episode.   In the Emergency Room, the patient was found to be in rapid atrial flutter with heart rate in the 140s. He received 30 mg of IV diltiazem. His heart rate in the 130s, started on heparin drip and IV diltiazem drip. In the CCU, rate continued in 140s, significant cough, worse last night.   Present Illness . SOCIAL HISTORY:  Nonsmoker, nonalcoholic. Lives with his family.   FAMILY HISTORY:  Significant for diabetes.   Physical Exam:  GEN well developed   HEENT hearing intact to voice, moist oral mucosa   RESP normal resp effort   CARD Regular rate and rhythm  Tachycardic  No murmur   ABD denies tenderness  soft   LYMPH negative neck   EXTR negative edema   SKIN normal to palpation   NEURO motor/sensory function intact   PSYCH alert, A+O to time, place, person, good insight   Review of Systems:  Subjective/Chief Complaint palpitations, cough, SOB   General: Weakness   Skin: No Complaints   ENT: No Complaints   Eyes: No Complaints   Neck: No Complaints   Respiratory: Frequent cough  Short of breath   Cardiovascular: Dyspnea   Gastrointestinal: No Complaints   Genitourinary: No Complaints   Vascular: No Complaints    Musculoskeletal: No Complaints   Neurologic: No Complaints   Hematologic: No Complaints   Endocrine: No Complaints   Psychiatric: No Complaints   Review of Systems: All other systems were reviewed and found to be negative   Medications/Allergies Reviewed Medications/Allergies reviewed     Gout:    Hypertension:    Renal dialysis Tues.Thurs.Sat.:    Glaucoma:    Depression and anxiety:    Renal Failure:    right kidney removed:    Tunneled Dialysis Catheter Right Chest:        Admit Diagnosis:   ATRIAL FIBRILLATION: Onset Date: 04-Dec-2012, Status: Active, Description: ATRIAL FIBRILLATION  EKG:  Interpretation EKG shows atrial flutter with rate 98 bpm   Radiology Results: XRay:    29-May-14 12:37, Chest Portable Single View  Chest Portable Single View   REASON FOR EXAM:    Chest Pain  COMMENTS:       PROCEDURE: DXR - DXR PORTABLE CHEST SINGLE VIEW  - Dec 04 2012 12:37PM     RESULT: Comparison is made to the previous study of 10/31/2012. The   cardiac silhouette is normal. There is no edema, infiltrate, effusion,   mass or pneumothorax. There is no evidence of blunting of the   costophrenic angles to suggest effusion.    IMPRESSION:   1. No evidence of pulmonary edema or pleural effusion. No acute   cardiopulmonary disease evident.    Dictation Site: 2    Verified By: Cay Schillings  H. BROWNE, M.D., MD    Codeine: Swelling   Impression 59 year old Hispanic gentleman with history of end-stage renal disease on hemodialysis, a history of gout, hypertension, glaucoma, depression and anxiety. He comes to the Emergency Room from dialysis after his heart rate was found to be in the 140s, rapid a-fib 1-1/2 hours into hemodialysis.   1) Atrial flutter suspect this started today or yesterday by sx at HD, worsening cough last night. Rate difficuilt to control in setting of anemia and heart failure.  --Will increase diltiazem infusion to 20 mg/hr, give digoxin x 1 If  no improvement in rate, will start amiodarone infusion. continue heparin infusion. -If unable to control rate, will need cardioversion  2) Cough From acute on chronic systolic and diastolic CHF by hx admitted to [facility] in 06/2012 fior similar sx. HD setting changed per patient, helped after HD, tghen sx get worse before next HD. Needs lower dry weight, need to improve anemia as this will make sx worse, (epo?) Needs HD tomorrow for CHF sx.  3) CAD,  by hx need cath report from [facility]  4) ESRDon HD needs lower dry weight for cougfh, CHF sx HD on Friday if possible   Electronic Signatures: Ida Rogue (MD)  (Signed 29-May-14 17:47)  Authored: General Aspect/Present Illness, History and Physical Exam, Review of System, Past Medical History, Health Issues, Home Medications, EKG , Radiology, Allergies, Impression/Plan   Last Updated: 29-May-14 17:47 by Ida Rogue (MD)

## 2014-10-29 NOTE — Discharge Summary (Signed)
PATIENT NAME:  Christopher Hickman, Christopher Hickman MR#:  [id number] DATE OF BIRTH:  June 23, 1956  DATE OF ADMISSION:  12/04/2012 DATE OF DISCHARGE:  12/07/2012  ADMITTING PHYSICIAN:  Dr. Fritzi Mandes.   DISCHARGING PHYSICIAN: Gladstone Lighter.  CONSULTATIONS IN THE HOSPITAL:  1.  Cardiology consultation by Dr. Fletcher Anon. 2.  Nephrology consultation with Dr. Anthonette Legato.   DISCHARGE DIAGNOSES:  Onset atrial fibrillation converted back to normal sinus.   PRIMARY CARE PHYSICIAN: [facility].   DISCHARGE DIAGNOSES: 1.  New onset atrial fib, converted back to normal sinus rhythm prior to discharge.  2.  Cardiomyopathy with chronic systolic congestive heart failure, ejection fraction of 35% to 40%.  3.  End-stage renal disease on hemodialysis.  4.  Hypertension.  5.  Anemia of chronic kidney disease.  6.  Secondary hyperparathyroidism.   DISCHARGE HOME MEDICATIONS:  1.  Rena-Vite 1 tablet p.o. daily.  2.  Sensipar 30 mg p.o. daily.  3.  Aspirin 81 mg p.o. daily.  4.  Enalapril 5 mg p.o. daily.  5.  Metoprolol 100 mg p.o. b.i.d.   DISCHARGE DIET: Renal diet.   DISCHARGE ACTIVITY: As tolerated. Also, no exertional activity and no heavy lifting advised because of his cardiomyopathy.      FOLLOWUP INSTRUCTIONS:    1.  Follow up with PCP in 2 weeks.  2.  Cardiology followup in 2 to 3 weeks.  3.  Nephrology followup for dialysis on 12/09/2012.   LABORATORY DATA AND IMAGING STUDIES PRIOR TO DISCHARGE:  1.  Sodium 137, potassium 4.4, chloride 99, bicarb 31, BUN 34, creatinine 7.2, glucose 74 and calcium of 6.9.  2.  His INR was 1.1.  3.  WBC 9.3, hemoglobin 8.4, hematocrit 24.2, platelet count 171.  4.  Troponin was mildly elevated at 0.12 when the patient was initially admitted.  5.  TSH within normal limits at 1.62.  6.  Chest x-ray showing clear lung fields. No acute cardiopulmonary disease evident.  BRIEF HOSPITAL COURSE:  Mr. Christopher Hickman is a 59 year old Spanish-speaking male with a  past medical history significant for hypertension, end-stage renal disease on Tuesday, Thursday, Saturday hemodialysis, who was brought in from dialysis secondary to atrial fibrillation.   1.  New onset atrial fibrillation. The patient was asymptomatic, and his heart rate was more detected based on his pulse during dialysis monitor, according to the patient. He has been in atrial fibrillation, and his heart rate was difficult to be controlled initially, so he was in CCU requiring cardizem drip and then  metoprolol at 100 mg p.o. b.i.d. He was on heparin drip all through the hospitalization while he was in A. fib, and the plan was to cardiovert him to normal sinus rhythm because he was not a good anticoagulation candidate because of his chronic anemia.  However, the patient converted himself into normal sinus rhythm, and he was monitored for a day after that, so he is being discharged only on metoprolol and baby aspirin at this time. Heparin IV was discontinued once he was converted into normal sinus rhythm. He will be following with Dr. Fletcher Anon as an outpatient. PO cardizem was not used due to his cardiomyopathy and low EF. 2.  Cardiomyopathy.  Systolic CHF well compensated. EJ of 35% to 40%. Could be tachycardia-induced cardiomyopathy, since the patient was asymptomatic. He could have been having tachycardia multiple time that went unrecognized. However he needs outpatient followup, and he is being discharged on aspirin and beta blocker, and also enalapril low dose was also  started.  3.  End-stage renal disease on Tuesday, Thursday, Saturday hemodialysis. He was dialyzed per his schedule while in the hospital and has been stable from nephrology standpoint. His course has been otherwise uneventful in the hospital.   DISCHARGE CONDITION: Stable.   DISCHARGE DISPOSITION:  Home.   TIME SPENT ON DISCHARGE: 45 minutes.    ____________________________ Gladstone Lighter, MD rk:dmm D: 12/08/2012 09:49:00  ET T: 12/08/2012 10:28:20 ET JOB#: [id number]  cc: Gladstone Lighter, MD, <Dictator> [place] Gladstone Lighter MD ELECTRONICALLY SIGNED 12/29/2012 13:40

## 2014-10-29 NOTE — H&P (Signed)
PATIENT NAME:  Christopher Hickman, Christopher Hickman MR#:  [id number] DATE OF BIRTH:  Mar 01, 1956  DATE OF ADMISSION:  12/04/2012  PRIMARY CARE PHYSICIAN:  [facility].  NEPHROLOGY: [facility], Dr. Candiss Norse, Dr. Holley Raring.   CHIEF COMPLAINT:  "My heart rate was very fast". History is obtained via interpreter. The patient Hispanic, Spanish speaking only.   HISTORY OF PRESENT ILLNESS:  The patient is a 59 year old Hispanic gentleman with history of end-stage renal disease on hemodialysis, a history of gout, hypertension, glaucoma, depression and anxiety. He comes to the Emergency Room from dialysis after his heart rate was found to be in the 140s, rapid a-fib 1-1/2 hours into hemodialysis. The patient denies any chest pain, does have some intermittent mild chest tightness. Denies any shortness of breath, dizziness or syncopal episode.   In the Emergency Room, the patient was found to be in rapid a-fib with heart rate in the 140s. He received 30 mg of IV diltiazem. His heart rate in the 130s during my evaluation, now being started on heparin drip and IV diltiazem drip. The patient is being placed in the CCU.  PAST MEDICAL HISTORY: 1.  Past history of end-stage renal disease on hemodialysis through a left AV fistula Tuesday, Thursday, Saturday at [facility].   2.  Glaucoma.  3.  Depression/anxiety.  4.  Hypertension.  5.  Gout.  6.  Right renal mass status post nephrectomy.   DISCHARGE MEDICATIONS:  1.  Sensipar 30 mg daily.  2.  Rena-Vite p.o. daily.  3.  Amlodipine 5 mg daily.   ALLERGIES:  CODEINE.  SOCIAL HISTORY:  Nonsmoker, nonalcoholic. Lives with his family.   FAMILY HISTORY:  Significant for diabetes.   PAST SURGICAL HISTORY:   1.  Left arm AV graft.  2.  Right kidney removal.  3.  Shoulder surgery.   REVIEW OF SYSTEMS:  CONSTITUTIONAL:  No fever, fatigue, weakness.  EYES:  No blurred or double vision. Positive for glaucoma.  ENT:  No tinnitus, ear pain, hearing loss.   RESPIRATORY:  No cough, wheeze, hemoptysis, or COPD.  CARDIOVASCULAR:  No chest pain. Positive for hypertension, arrhythmia and chest tightness.  GASTROINTESTINAL:  No nausea, vomiting, diarrhea, abdominal pain or GERD.  GENITOURINARY:  No dysuria or hematuria.  ENDOCRINE:  No polyuria, nocturia or thyroid problems.  HEMATOLOGY:  Positive for chronic anemia.  SKIN:  No acne or rash.  MUSCULOSKELETAL:  Positive for arthritis.  NEUROLOGIC:  No CVA or TIA.  PSYCHIATRIC:  No anxiety or depression. All other systems reviewed and negative.   PHYSICAL EXAMINATION:  GENERAL:  The patient is awake, alert, oriented x 3, not in acute distress.  VITAL SIGNS:  Afebrile, pulse is 140, he is rapid a-fib, blood pressure is 117/56. Sats are 97% on room air.  HEENT:  Atraumatic, normocephalic. Pupils PERRLA, EOM intact. Oral mucosa is moist.  NECK:  Supple. No JVD. No carotid bruit.  LUNGS:  Clear to auscultation bilaterally. No rales, rhonchi, respiratory distress or labored breathing.  HEART:  Both the heart sounds are normal. Irregularly irregular heart rhythm. No murmur heard. PMI not lateralized. Chest nontender.  EXTREMITIES:  Good pedal pulses, good femoral pulses. No lower extremity edema.  ABDOMEN:  Soft, benign, nontender. No organomegaly. Positive bowel sounds.  NEUROLOGIC:  Grossly intact cranial nerves II through XII. No motor or sensory deficit.  PSYCHIATRIC:  The patient is awake, alert, oriented x 3.  SKIN:  Warm and generalized pallor present.  LABORATORY, DIAGNOSTIC, AND RADIOLOGICAL DATA:  Troponin is 0.06. CK total and MB is 1.6. PT/INR is 13.4 and 1.0. H and H is 7.7 and 23.0, platelet count is 149, white count is 6.4, albumin is 3.2. LFTs otherwise are normal.   CHEST X-RAY:  No evidence of pulmonary edema or pleural effusion.  EKG shows rapid a-fib.   ASSESSMENT AND PLAN:  A 59 year old gentleman with a history of end-stage renal disease on hemodialysis, hypertension and history  of glaucoma, comes to the Emergency Room with rapid heart rate at hemodialysis. The patient completed an hour and a half of hemodialysis today. He is being admitted with:  1.  New onset rapid atrial fibrillation with right ventricular response. The patient will be admitted to the Intensive Care Unit. He will be placed on renal diet. We will continue IV Cardizem drip and IV heparin for now. The patient's old echo reviewed, ejection fraction of 35% to 45% with hypokinetic akinetic walls. This was in 09/2010. The patient reports having some cardiac workup done at [facility]. We are awaiting the records from [facility]. In the meantime, the case was discussed with Dr. Rockey Situ, who will see the patient in consultation. Will cycle cardiac enzymes x 3. Check TSH.  2.  Anemia of chronic disease suspected due to end-stage renal disease. We will transfuse as indicated. We will consider transfusing a unit of blood given history of cardiomyopathy and rapid atrial fibrillation at this time.  3.  End-stage renal disease on hemodialysis. Will have Dr. Holley Raring see the patient to continue inpatient hemodialysis.  4.  Hypertension. The patient is on amlodipine. I will hold off on it since right now he is on Cardizem and we may need to add other rate blocking agents.  5.  Deep vein thrombosis prophylaxis: The patient already on heparin drip.  6.  Further workup according to the patient's clinical course. Hospital admission plan was discussed with the patient. No family members were present. The patient did voice understanding, interpretation was required via [place] interpreter.   CRITICAL TIME SPENT:  60 minutes.  ____________________________ Christopher Rochester Posey Pronto, MD sap:jm D: 12/04/2012 14:36:58 ET T: 12/04/2012 15:46:26 ET JOB#: [id number]  cc: Renley Gutman A. Posey Pronto, MD, <Dictator> Ilda Basset MD ELECTRONICALLY SIGNED 12/11/2012 19:36

## 2014-10-31 NOTE — Op Note (Signed)
PATIENT NAME:  Christopher Hickman, Christopher Hickman MR#:  [id number] DATE OF BIRTH:  Apr 13, 1956  DATE OF PROCEDURE:  01/04/2012  PREOPERATIVE DIAGNOSIS: Poorly functioning dialysis access with poor flows and inability to cannulate.   POSTOPERATIVE DIAGNOSIS: Poorly functioning dialysis access with poor flows and inability to cannulate with end-stage renal disease requiring hemodialysis.   PROCEDURES:    1. Left radiocephalic fistulogram.  2. Percutaneous transluminal angioplasty arterial portion to 9 mm.   SURGEON: Katha Cabal, M.D.   SEDATION: Versed 3 mg plus fentanyl 100 mcg administered IV. Continuous ECG, pulse oximetry and cardiopulmonary monitoring was performed throughout the entire procedure by the interventional radiology nurse. Total sedation time was one hour, 10 minutes.   ACCESS: 6 French sheath, retrograde direction, left wrist radiocephalic fistula.  CONTRAST USED: Isovue 40 mL.  FLUOROSCOPY TIME: 5.6 minutes.   INDICATIONS: Mr. Christopher Hickman is a 59 year old gentleman who has been having increasing problems at dialysis with inability to cannulate his fistula and very slow flow with poor dialysis runs. He is therefore referred for evaluation and treatment. The risks and benefits were reviewed. All questions answered. The patient has agreed to proceed.   DESCRIPTION OF PROCEDURE: The patient is taken to special procedures and placed in the supine position with his left arm extended palm upward. The left arm is prepped and draped in a sterile fashion. After adequate sedation has been achieved, the fistula is palpated and a micropuncture needle is inserted in a retrograde direction just below the level of the antecubital fossa, microwire followed micro sheath, J-wire followed by a 6 French sheath. Glidewire and KMP catheter are negotiated into the arterial system and hand injection of contrast is used to demonstrate high-grade greater than 90% stricture just above the artery at the level of  the anastomosis. Magic torque wire is then exchanged for the Glidewire through the [place] catheter. 3,000 units of heparin is given and serial dilatation of this lesion is performed beginning with a 6 mm balloon and stepping through a 7, 8 and ultimately a 9 mm balloon to achieve an adequate result. During the procedure, nitroglycerin was also administered in two separate 250 mcg boluses. Following the 9 mm dilatation with the KMP catheter positioned within the radial artery, there is now rapid flow of contrast through the fistula with resolution of the stricture. More proximal imaging demonstrates the veins are widely patent.   A pursestring suture is placed. Sheath is pulled, light pressure is held, and there are no immediate complications.   INTERPRETATION: Initial views of the fistula demonstrate the radial artery is widely patent as is the palmar arch. There is a greater than 90% stricture of the fistula at the level of the anastomosis extending into the vein. The remaining portion of the cephalic vein within the forearm is widely patent. The cephalic vein and basilic vein in the upper arm is also widely patent and well dilated. Central veins are widely patent. Following angioplasty as described above, there is resolution of the stenosis.   SUMMARY: Successful salvage of left radiocephalic fistula as described above.   ____________________________ Katha Cabal, MD ggs:ap D: 01/04/2012 18:34:58 ET T: 01/05/2012 09:27:08 ET JOB#: [id number]  cc: Katha Cabal, MD, <Dictator> Katha Cabal MD ELECTRONICALLY SIGNED 01/05/2012 12:52

## 2016-08-28 ENCOUNTER — Observation Stay
Admission: EM | Admit: 2016-08-28 | Discharge: 2016-08-29 | Disposition: A | Discharge status: Home / Self Care | Payer: Medicare Other | Attending: Internal Medicine | Admitting: Internal Medicine

## 2016-08-28 ENCOUNTER — Encounter: Payer: Self-pay | Admitting: Emergency Medicine

## 2016-08-28 ENCOUNTER — Emergency Department: Payer: Medicare Other

## 2016-08-28 ENCOUNTER — Other Ambulatory Visit: Payer: Self-pay

## 2016-08-28 ENCOUNTER — Observation Stay
Admit: 2016-08-28 | Discharge: 2016-08-28 | Disposition: A | Discharge status: Home / Self Care | Payer: Medicare Other | Attending: Internal Medicine | Admitting: Internal Medicine

## 2016-08-28 DIAGNOSIS — R633 Feeding difficulties: Secondary | ICD-10-CM

## 2016-08-28 DIAGNOSIS — Z9111 Patient's noncompliance with dietary regimen: Secondary | ICD-10-CM | POA: Diagnosis not present

## 2016-08-28 DIAGNOSIS — R079 Chest pain, unspecified: Secondary | ICD-10-CM | POA: Diagnosis not present

## 2016-08-28 DIAGNOSIS — I160 Hypertensive urgency: Secondary | ICD-10-CM | POA: Diagnosis not present

## 2016-08-28 DIAGNOSIS — I4891 Unspecified atrial fibrillation: Secondary | ICD-10-CM | POA: Diagnosis not present

## 2016-08-28 DIAGNOSIS — Z79899 Other long term (current) drug therapy: Secondary | ICD-10-CM | POA: Diagnosis not present

## 2016-08-28 DIAGNOSIS — Z7982 Long term (current) use of aspirin: Secondary | ICD-10-CM | POA: Diagnosis not present

## 2016-08-28 DIAGNOSIS — I428 Other cardiomyopathies: Secondary | ICD-10-CM | POA: Diagnosis not present

## 2016-08-28 DIAGNOSIS — R778 Other specified abnormalities of plasma proteins: Secondary | ICD-10-CM | POA: Insufficient documentation

## 2016-08-28 DIAGNOSIS — N186 End stage renal disease: Secondary | ICD-10-CM

## 2016-08-28 DIAGNOSIS — Z992 Dependence on renal dialysis: Secondary | ICD-10-CM | POA: Diagnosis not present

## 2016-08-28 DIAGNOSIS — E875 Hyperkalemia: Secondary | ICD-10-CM | POA: Diagnosis not present

## 2016-08-28 DIAGNOSIS — Z87891 Personal history of nicotine dependence: Secondary | ICD-10-CM | POA: Diagnosis not present

## 2016-08-28 DIAGNOSIS — N2581 Secondary hyperparathyroidism of renal origin: Secondary | ICD-10-CM | POA: Diagnosis not present

## 2016-08-28 DIAGNOSIS — I132 Hypertensive heart and chronic kidney disease with heart failure and with stage 5 chronic kidney disease, or end stage renal disease: Secondary | ICD-10-CM | POA: Diagnosis not present

## 2016-08-28 DIAGNOSIS — I5023 Acute on chronic systolic (congestive) heart failure: Secondary | ICD-10-CM | POA: Insufficient documentation

## 2016-08-28 DIAGNOSIS — I2510 Atherosclerotic heart disease of native coronary artery without angina pectoris: Secondary | ICD-10-CM | POA: Insufficient documentation

## 2016-08-28 DIAGNOSIS — D631 Anemia in chronic kidney disease: Secondary | ICD-10-CM | POA: Diagnosis not present

## 2016-08-28 DIAGNOSIS — R638 Other symptoms and signs concerning food and fluid intake: Secondary | ICD-10-CM

## 2016-08-28 DIAGNOSIS — Z905 Acquired absence of kidney: Secondary | ICD-10-CM | POA: Insufficient documentation

## 2016-08-28 LAB — BASIC METABOLIC PANEL
Anion gap: 15 (ref 5–15)
BUN: 67 mg/dL — AB (ref 6–20)
CHLORIDE: 104 mmol/L (ref 101–111)
CO2: 23 mmol/L (ref 22–32)
Calcium: 7.9 mg/dL — ABNORMAL LOW (ref 8.9–10.3)
Creatinine, Ser: 14 mg/dL — ABNORMAL HIGH (ref 0.61–1.24)
GFR calc Af Amer: 4 mL/min — ABNORMAL LOW (ref >60)
GFR calc non Af Amer: 3 mL/min — ABNORMAL LOW (ref >60)
GLUCOSE: 99 mg/dL (ref 65–99)
POTASSIUM: 5.7 mmol/L — AB (ref 3.5–5.1)
Sodium: 142 mmol/L (ref 135–145)

## 2016-08-28 LAB — CBC
HEMATOCRIT: 27.8 % — AB (ref 40.0–52.0)
Hemoglobin: 9.6 g/dL — ABNORMAL LOW (ref 13.0–18.0)
MCH: 32.4 pg (ref 26.0–34.0)
MCHC: 34.4 g/dL (ref 32.0–36.0)
MCV: 94.3 fL (ref 80.0–100.0)
Platelets: 219 10*3/uL (ref 150–440)
RBC: 2.95 MIL/uL — ABNORMAL LOW (ref 4.40–5.90)
RDW: 15.1 % — AB (ref 11.5–14.5)
WBC: 9.4 10*3/uL (ref 3.8–10.6)

## 2016-08-28 LAB — TROPONIN I
TROPONIN I: 0.04 ng/mL — AB (ref <0.03)
Troponin I: 0.04 ng/mL (ref <0.03)
Troponin I: 0.04 ng/mL (ref <0.03)
Troponin I: 0.03 ng/mL (ref <0.03)

## 2016-08-28 LAB — MRSA PCR SCREENING: MRSA BY PCR: NEGATIVE

## 2016-08-28 LAB — PHOSPHORUS: PHOSPHORUS: 6.5 mg/dL — AB (ref 2.5–4.6)

## 2016-08-28 MED ORDER — LABETALOL HCL 5 MG/ML IV SOLN
10.0000 mg | INTRAVENOUS | Status: DC | PRN
  Administered 2016-08-28: 10 mg via INTRAVENOUS
  Filled 2016-08-28 (×2): qty 4

## 2016-08-28 MED ORDER — LIDOCAINE HCL (PF) 1 % IJ SOLN
5.0000 mL | INTRAMUSCULAR | Status: DC | PRN
  Filled 2016-08-28: qty 5

## 2016-08-28 MED ORDER — METOPROLOL TARTRATE 50 MG PO TABS
100.0000 mg | ORAL_TABLET | Freq: Two times a day (BID) | ORAL | Status: DC
  Administered 2016-08-28 – 2016-08-29 (×4): 100 mg via ORAL
  Filled 2016-08-28 (×4): qty 2

## 2016-08-28 MED ORDER — SODIUM CHLORIDE 0.9% FLUSH: 3.0000 mL | INTRAVENOUS | Status: DC | PRN

## 2016-08-28 MED ORDER — LABETALOL HCL 5 MG/ML IV SOLN
20.0000 mg | Freq: Once | INTRAVENOUS | Status: AC
  Administered 2016-08-28: 20 mg via INTRAVENOUS
  Filled 2016-08-28: qty 4

## 2016-08-28 MED ORDER — RENA-VITE PO TABS
1.0000 | ORAL_TABLET | Freq: Every day | ORAL | Status: DC
  Administered 2016-08-28: 1 via ORAL
  Filled 2016-08-28 (×2): qty 1

## 2016-08-28 MED ORDER — LIDOCAINE-PRILOCAINE 2.5-2.5 % EX CREA: 1.0000 "application " | TOPICAL_CREAM | CUTANEOUS | Status: DC | PRN

## 2016-08-28 MED ORDER — CINACALCET HCL 30 MG PO TABS
30.0000 mg | ORAL_TABLET | Freq: Every day | ORAL | Status: DC
  Administered 2016-08-28 – 2016-08-29 (×2): 30 mg via ORAL
  Filled 2016-08-28 (×2): qty 1

## 2016-08-28 MED ORDER — SODIUM CHLORIDE 0.9 % IV SOLN: 100.0000 mL | INTRAVENOUS | Status: DC | PRN

## 2016-08-28 MED ORDER — ENALAPRIL MALEATE 10 MG PO TABS
5.0000 mg | ORAL_TABLET | Freq: Every day | ORAL | Status: DC
  Administered 2016-08-28: 5 mg via ORAL
  Filled 2016-08-28: qty 1

## 2016-08-28 MED ORDER — ASPIRIN 81 MG PO CHEW
324.0000 mg | CHEWABLE_TABLET | ORAL | Status: AC
  Administered 2016-08-28: 324 mg via ORAL
  Filled 2016-08-28: qty 4

## 2016-08-28 MED ORDER — ASPIRIN EC 81 MG PO TBEC
81.0000 mg | DELAYED_RELEASE_TABLET | Freq: Every day | ORAL | Status: DC
  Administered 2016-08-29: 81 mg via ORAL
  Filled 2016-08-28: qty 1

## 2016-08-28 MED ORDER — EPOETIN ALFA 4000 UNIT/ML IJ SOLN
4000.0000 [IU] | INTRAMUSCULAR | Status: DC
  Administered 2016-08-28: 4000 [IU] via INTRAVENOUS
  Filled 2016-08-28: qty 1

## 2016-08-28 MED ORDER — ASPIRIN 81 MG PO TABS: 81.0000 mg | ORAL_TABLET | Freq: Every day | ORAL | Status: DC

## 2016-08-28 MED ORDER — SODIUM CHLORIDE 0.9 % IV SOLN: 250.0000 mL | INTRAVENOUS | Status: DC | PRN

## 2016-08-28 MED ORDER — AMLODIPINE BESYLATE 5 MG PO TABS
5.0000 mg | ORAL_TABLET | Freq: Every day | ORAL | Status: DC
  Administered 2016-08-28: 5 mg via ORAL
  Filled 2016-08-28: qty 1

## 2016-08-28 MED ORDER — ACETAMINOPHEN 325 MG PO TABS
650.0000 mg | ORAL_TABLET | ORAL | Status: DC | PRN
  Administered 2016-08-28 (×2): 650 mg via ORAL
  Filled 2016-08-28: qty 2

## 2016-08-28 MED ORDER — SODIUM CHLORIDE 0.9% FLUSH
3.0000 mL | Freq: Two times a day (BID) | INTRAVENOUS | Status: DC
  Administered 2016-08-28: 3 mL via INTRAVENOUS

## 2016-08-28 MED ORDER — ALTEPLASE 2 MG IJ SOLR: 2.0000 mg | Freq: Once | INTRAMUSCULAR | Status: DC | PRN

## 2016-08-28 MED ORDER — IRBESARTAN 75 MG PO TABS
150.0000 mg | ORAL_TABLET | Freq: Two times a day (BID) | ORAL | Status: DC
  Administered 2016-08-28 – 2016-08-29 (×2): 150 mg via ORAL
  Filled 2016-08-28: qty 1
  Filled 2016-08-28 (×2): qty 2

## 2016-08-28 MED ORDER — SODIUM POLYSTYRENE SULFONATE 15 GM/60ML PO SUSP
15.0000 g | Freq: Once | ORAL | Status: AC
  Administered 2016-08-28: 15 g via ORAL
  Filled 2016-08-28: qty 60

## 2016-08-28 MED ORDER — HEPARIN SODIUM (PORCINE) 5000 UNIT/ML IJ SOLN
5000.0000 [IU] | Freq: Three times a day (TID) | INTRAMUSCULAR | Status: DC
  Administered 2016-08-28 – 2016-08-29 (×3): 5000 [IU] via SUBCUTANEOUS
  Filled 2016-08-28 (×3): qty 1

## 2016-08-28 MED ORDER — AMLODIPINE BESYLATE 10 MG PO TABS
10.0000 mg | ORAL_TABLET | Freq: Every day | ORAL | Status: DC
  Administered 2016-08-29: 10 mg via ORAL
  Filled 2016-08-28: qty 1

## 2016-08-28 MED ORDER — ONDANSETRON HCL 4 MG/2ML IJ SOLN: 4.0000 mg | Freq: Four times a day (QID) | INTRAMUSCULAR | Status: DC | PRN

## 2016-08-28 MED ORDER — NITROGLYCERIN 0.4 MG SL SUBL: 0.4000 mg | SUBLINGUAL_TABLET | SUBLINGUAL | Status: DC | PRN

## 2016-08-28 MED ORDER — HEPARIN SODIUM (PORCINE) 1000 UNIT/ML DIALYSIS
1000.0000 [IU] | INTRAMUSCULAR | Status: DC | PRN
  Filled 2016-08-28: qty 1

## 2016-08-28 MED ORDER — ASPIRIN 300 MG RE SUPP: 300.0000 mg | RECTAL | Status: AC

## 2016-08-28 MED ORDER — PENTAFLUOROPROP-TETRAFLUOROETH EX AERO: 1.0000 "application " | INHALATION_SPRAY | CUTANEOUS | Status: DC | PRN

## 2016-08-28 NOTE — Progress Notes (Signed)
  End of hd 

## 2016-08-28 NOTE — Progress Notes (Signed)
[facility]  ROUNDING NOTE   Subjective:  Patient well known to [place] from outpt HD. Presented with chest pain.  Pt dialyzes on TTHS 1st shift at [employer]. Also found to have hyperkalemia today. Therefore pt scheduled for dialysis.   Objective:  Vital signs in last 24 hours:  Temp:  [98.4 F (36.9 C)] 98.4 F (36.9 C) (02/20 0415) Pulse Rate:  [68-112] 68 (02/20 1300) Resp:  [11-24] 16 (02/20 1300) BP: (148-211)/(67-109) 163/93 (02/20 1300) SpO2:  [95 %-100 %] 99 % (02/20 1300) Weight:  [84.4 kg (186 lb)] 84.4 kg (186 lb) (02/20 0420)  Weight change:  Filed Weights   08/28/16 0420  Weight: 84.4 kg (186 lb)    Intake/Output: No intake/output data recorded.   Intake/Output this shift:  No intake/output data recorded.  Physical Exam: General: No acute distress  Head: Normocephalic, atraumatic. Moist oral mucosal membranes  Eyes: Anicteric  Neck: Supple, trachea midline  Lungs:  Clear to auscultation, normal effort  Heart: S1S2 no rubs  Abdomen:  Soft, nontender, bowel sounds present  Extremities: traceperipheral edema.  Neurologic: Nonfocal, moving all four extremities  Skin: No lesions  Access: Left forearm AVF    Basic Metabolic Panel:  Recent Labs Lab 08/28/16 0456  NA 142  K 5.7*  CL 104  CO2 23  GLUCOSE 99  BUN 67*  CREATININE 14.00*  CALCIUM 7.9*    Liver Function Tests: No results for input(s): AST, ALT, ALKPHOS, BILITOT, PROT, ALBUMIN in the last 168 hours. No results for input(s): LIPASE, AMYLASE in the last 168 hours. No results for input(s): AMMONIA in the last 168 hours.  CBC:  Recent Labs Lab 08/28/16 0456  WBC 9.4  HGB 9.6*  HCT 27.8*  MCV 94.3  PLT 219    Cardiac Enzymes:  Recent Labs Lab 08/28/16 0456 08/28/16 0910  TROPONINI 0.04* 0.04*    BNP: Invalid input(s): POCBNP  CBG: No results for input(s): GLUCAP in the last 168 hours.  Microbiology: No results found for this or any previous  visit.  Coagulation Studies: No results for input(s): LABPROT, INR in the last 72 hours.  Urinalysis: No results for input(s): COLORURINE, LABSPEC, PHURINE, GLUCOSEU, HGBUR, BILIRUBINUR, KETONESUR, PROTEINUR, UROBILINOGEN, NITRITE, LEUKOCYTESUR in the last 72 hours.  Invalid input(s): APPERANCEUR    Imaging: Dg Chest 2 View  Result Date: 08/28/2016 CLINICAL DATA:  Shortness of breath and chest pain EXAM: CHEST  2 VIEW COMPARISON:  Chest radiograph 12/04/2012 FINDINGS: The lungs are well inflated. Cardiomediastinal silhouette is normal. There is central pulmonary vascular congestion and linear opacities along the periphery of the lungs. No pleural effusion or pneumothorax. No focal consolidation. IMPRESSION: Mild interstitial pulmonary edema. Electronically Signed   By: Ulyses Jarred M.D.   On: 08/28/2016 05:02     Medications:   . sodium chloride    . sodium chloride    . sodium chloride     . amLODipine  5 mg Oral Daily  . [START ON 08/29/2016] aspirin EC  81 mg Oral Daily  . cinacalcet  30 mg Oral Q breakfast  . heparin  5,000 Units Subcutaneous Q8H  . irbesartan  150 mg Oral BID  . metoprolol  100 mg Oral Q12H  . multivitamin  1 tablet Oral QHS  . sodium chloride flush  3 mL Intravenous Q12H   sodium chloride, sodium chloride, sodium chloride, acetaminophen, alteplase, heparin, labetalol, lidocaine (PF), lidocaine-prilocaine, nitroGLYCERIN, ondansetron (ZOFRAN) IV, pentafluoroprop-tetrafluoroeth, sodium chloride flush  Assessment/ Plan:  61  y.o. male Hispanic male with past medical history of ESRD on HD TTS, gout, secondary hyperparathyroidism, hypertension, atrial fibrillation, anemia of chronic kidney disease, cardio myopathy ejection fraction 35-40% who presented with chest pain.  [facility]TTHS-1  1. ESRD on HD TTS: Patient is due for hemodialysis today per his usual schedule and also has hyperkalemia. We will plan for a potassium bath of 2K. Ultrafiltration target  1.5-2 kg.  2. Hyperkalemia. Serum potassium 5.6. We will plan for 2K bath as above.  3. Secondary hyperparathyroidism. Maintain the patient on cinacalcet. He was apparently taking Tums at home. Continue to monitor PTH and phosphorus while here.  4. Anemia of chronic kidney disease. Hemoglobin currently 9.6. We will start the patient on Epogen 4000 units IV with dialysis.   LOS: 0 Saheed Carrington 60/20/20182:47 PM

## 2016-08-28 NOTE — Consult Note (Signed)
Cardiology Consultation Note  Patient ID: Christopher Hickman, MRN: [id number], DOB/AGE: Oct 17, 1955 61 y.o. Admit date: 08/28/2016   Date of Consult: 08/28/2016 Primary Physician: Letta Median, MD Primary Cardiologist: [facility] Requesting Physician: Dr. Estanislado Pandy, MD  Chief Complaint: SOB/LE swelling Reason for Consult: Same  HPI: 61 y.o. male with h/o nonobstructive CAD by cardiac catheterization in 0865, chronic systolic CHF/NICM with prior EF of 40% now improved to 55-60% by echo in 02/2016, ESRD on HD (TTS), HTN, and tobacco abuse who presented to [facility] with increased SOB and LE swelling in the setting of dietary indiscretions eating mostly ham biscuits lately.   Entire visit was performed with hospital medical interpreter.  Patient has been seen at [facility] for pre-operative evaluation as part of renal transplant program. Prior echo in 2013 showed normal EF with no significant valvular abnormalities. Stress test at that time showed a small, reversible defect. A subsequent cardiac cath in 2013 showed no significant CAD. Echo in 2014 showed a mildly reduced EF of 40%. Follow up echo in 02/2016 showed normalization of EF and normal RV function.  Over the past couple of weeks patient has been eating mosly ham biscuits. This has lead to an increase in his BP and LE swelling. His appetite has been down some as well, though has not noted any early satiety. No orthopnea. Minimal nonexertional chest pain.   Upon the patient's arrival to [facility] they were found to have BP of 211/105, troponin minimally elevated at 0.04 x 2, hgb 9.6, SCr 14.00. ECG showed sinus tachycardia, 108 bpm, nonspecific st/t changes, CXR showed mild pulmonary edema. Patient's BP has mildly improved to the 784 systolic range. No chest pain.   Past Medical History:  Diagnosis Date  . Anemia of chronic kidney failure   . Anxiety and depression   . Cardiomyopathy ([place])    a. prior EF 40%, now with normal EF as of 02/2016  . End stage  renal disease ([place])   . Glaucoma   . Gout   . Hyperparathyroidism ([place])   . Hypertension   . Right renal mass       Most Recent Cardiac Studies: As above   Surgical History:  Past Surgical History:  Procedure Laterality Date  . CARDIAC CATHETERIZATION  07/2004   [facility]; ef60%  . left arm av graft    . NEPHRECTOMY    . SHOULDER SURGERY       Home Meds: Prior to Admission medications   Medication Sig Start Date End Date Taking? Authorizing Provider  amLODipine (NORVASC) 10 MG tablet Take 10 mg by mouth daily.    Yes Historical Provider, MD  aspirin 81 MG tablet Take 81 mg by mouth 2 (two) times daily.    Yes Historical Provider, MD  b complex-C-folic acid 1 MG capsule Take 1 capsule by mouth daily.   Yes Historical Provider, MD  calcium acetate (PHOSLO) 667 MG capsule Take 2,001 mg by mouth 3 (three) times daily with meals. 05/24/16  Yes Historical Provider, MD  cinacalcet (SENSIPAR) 30 MG tablet Take 60 mg by mouth daily.    Yes Historical Provider, MD  valsartan (DIOVAN) 160 MG tablet Take 1 tablet by mouth 2 (two) times daily. 08/07/16  Yes Historical Provider, MD    Inpatient Medications:  . amLODipine  5 mg Oral Daily  . [START ON 08/29/2016] aspirin EC  81 mg Oral Daily  . cinacalcet  30 mg Oral Q breakfast  . heparin  5,000 Units Subcutaneous Q8H  .  irbesartan  150 mg Oral BID  . metoprolol  100 mg Oral Q12H  . multivitamin  1 tablet Oral QHS  . sodium chloride flush  3 mL Intravenous Q12H   . sodium chloride    . sodium chloride    . sodium chloride      Allergies:  Allergies  Allergen Reactions  . Codeine     swelling    Social History   Social History  . Marital status: Single    Spouse name: N/A  . Number of children: N/A  . Years of education: N/A   Occupational History  . Not on file.   Social History Main Topics  . Smoking status: Never Smoker  . Smokeless tobacco: Never Used  . Alcohol use No  . Drug use: No  . Sexual activity: Not on file    Other Topics Concern  . Not on file   Social History Narrative  . No narrative on file     Family History  Problem Relation Age of Onset  . Hypertension Mother      Review of Systems: Review of Systems  Constitutional: Positive for malaise/fatigue. Negative for chills, diaphoresis, fever and weight loss.  HENT: Negative for congestion.   Eyes: Negative for discharge and redness.  Respiratory: Positive for shortness of breath. Negative for cough, hemoptysis, sputum production and wheezing.   Cardiovascular: Positive for chest pain and leg swelling. Negative for palpitations, orthopnea, claudication and PND.  Gastrointestinal: Negative for abdominal pain, blood in stool, heartburn, melena, nausea and vomiting.  Genitourinary: Negative for hematuria.  Musculoskeletal: Negative for falls and myalgias.  Skin: Negative for rash.  Neurological: Negative for dizziness, tingling, tremors, sensory change, speech change, focal weakness, loss of consciousness and weakness.  Endo/Heme/Allergies: Does not bruise/bleed easily.  Psychiatric/Behavioral: Negative for substance abuse. The patient is not nervous/anxious.   All other systems reviewed and are negative.   Labs:  Recent Labs  08/28/16 0456 08/28/16 0910  TROPONINI 0.04* 0.04*   Lab Results  Component Value Date   WBC 9.4 08/28/2016   HGB 9.6 (L) 08/28/2016   HCT 27.8 (L) 08/28/2016   MCV 94.3 08/28/2016   PLT 219 08/28/2016     Recent Labs Lab 08/28/16 0456  NA 142  K 5.7*  CL 104  CO2 23  BUN 67*  CREATININE 14.00*  CALCIUM 7.9*  GLUCOSE 99   No results found for: CHOL, HDL, LDLCALC, TRIG No results found for: DDIMER  Radiology/Studies:  Dg Chest 2 View  Result Date: 08/28/2016 CLINICAL DATA:  Shortness of breath and chest pain EXAM: CHEST  2 VIEW COMPARISON:  Chest radiograph 12/04/2012 FINDINGS: The lungs are well inflated. Cardiomediastinal silhouette is normal. There is central pulmonary vascular  congestion and linear opacities along the periphery of the lungs. No pleural effusion or pneumothorax. No focal consolidation. IMPRESSION: Mild interstitial pulmonary edema. Electronically Signed   By: Ulyses Jarred M.D.   On: 08/28/2016 05:02    EKG: Interpreted by me showed: sinus tachycardia, 108 bpm, nonspecific st/t changes Telemetry: Interpreted by me showed: sinus rhythm  Weights: Filed Weights   08/28/16 0420  Weight: 186 lb (84.4 kg)     Physical Exam: Blood pressure (!) 163/93, pulse 68, temperature 98.4 F (36.9 C), temperature source Oral, resp. rate 16, height 5' 11.65" (1.82 m), weight 186 lb (84.4 kg), SpO2 99 %. Body mass index is 25.47 kg/m. General: Well developed, well nourished, in no acute distress. Head: Normocephalic, atraumatic, sclera non-icteric, no  xanthomas, nares are without discharge.  Neck: Negative for carotid bruits. JVD not elevated. Lungs: Clear bilaterally to auscultation without wheezes, rales, or rhonchi. Breathing is unlabored. Heart: RRR with S1 S2. No murmurs, rubs, or gallops appreciated. Abdomen: Soft, non-tender, non-distended with normoactive bowel sounds. No hepatomegaly. No rebound/guarding. No obvious abdominal masses. Msk:  Strength and tone appear normal for age. Extremities: No clubbing or cyanosis. 1+ pedal edema bilaterally. Distal pedal pulses are 2+ and equal bilaterally. Neuro: Alert and oriented X 3. No facial asymmetry. No focal deficit. Moves all extremities spontaneously. Psych:  Responds to questions appropriately with a normal affect.    Assessment and Plan:  Principal Problem:   Hypertensive urgency Active Problems:   Dietary indiscretion   ESRD (end stage renal disease) on dialysis (HCC)   NICM (nonischemic cardiomyopathy) (HCC)   Chest pain    1. SOB: -Likely in the setting of his significant dietary indiscretion, eating mostly ham biscuits lately leading to an increase in fluid retention, mild volume overload,  and accelerated HTN -Recommend addressing the primary problem (see above) and treat BP to goal -Check echo   2. NICM: -Volume managed by HD (TTS) -Has not missed any HD sessions, though has been eating a diet very high is salt leading to the above -Check echo, if normal EF no further inpatient cardiac workup needed -Continue current medications  3. Hypertensive urgency: -As above -Defer treatment to IM  4. ESRD: -Per renal  5. Chest pain/elevated troponin: -Minimally elevated likely in the setting of SBP of 211 and ESRD with SCr of 14 -Unlikely a primary ischemic event -As above  6. Remaining per IM   Signed, Christell Faith, PA-C [facility] Pager: [phone] 08/28/2016, 2:41 PM

## 2016-08-28 NOTE — Progress Notes (Signed)
Hd start 

## 2016-08-28 NOTE — ED Notes (Signed)
Report given to [facility] in dialysis, patient transported with interpreter.  2A notified, patient to transport to admission room after dialysis.

## 2016-08-28 NOTE — H&P (Signed)
[facility] [place] NAME: Christopher Hickman    MR#:  [id number]  DATE OF BIRTH:  15-Dec-1955  DATE OF ADMISSION:  08/28/2016  PRIMARY CARE PHYSICIAN: Letta Median, MD   REQUESTING/REFERRING PHYSICIAN:   CHIEF COMPLAINT:   Chief Complaint  Patient presents with  . Chest Pain    HISTORY OF PRESENT ILLNESS: Christopher Hickman  is a 61 y.o. male with a known history of End-stage renal disease on dialysis, cardiomyopathy with EF of 35%, gout, glaucoma, hyperparathyroidism, hypertension presented to the emergency room with chest pain since this morning. Patient woke up around 1 AM this morning with chest discomfort and difficulty breathing. He had an episode of shortness of breath and he had upper chest pain. The pain was sharp in nature 3 out of 10 scale of 1-10. Patient gets dialyzed on Tuesday Thursday and Saturday. Patient presented to the emergency room his blood pressure was elevated systolic blood pressure was more than 200, he was given labetalol and intravenously and blood pressure came down. He follows with Dr. Candiss Norse nephrologist in the community. Patient was evaluated in the emergency room first set of troponin was borderline. Is due for dialysis today. EKG normal sinus rhythm with no ST segment elevation. Hospitalist service was consulted for further care of the patient.  PAST MEDICAL HISTORY:   Past Medical History:  Diagnosis Date  . A-fib ([place])   . Anemia of chronic kidney failure   . Anxiety and depression   . Cardiomyopathy ([place])    EF 35-40%  . End stage renal disease ([place])   . End stage renal disease ([place])   . Glaucoma   . Gout   . Hyperparathyroidism ([place])   . Hypertension   . Right renal mass     PAST SURGICAL HISTORY: Past Surgical History:  Procedure Laterality Date  . CARDIAC CATHETERIZATION  07/2004   [facility]; ef60%  . left arm av graft    . NEPHRECTOMY    . SHOULDER SURGERY      SOCIAL  HISTORY:  Social History  Substance Use Topics  . Smoking status: Never Smoker  . Smokeless tobacco: Never Used  . Alcohol use No    FAMILY HISTORY: No family history on file.  DRUG ALLERGIES:  Allergies  Allergen Reactions  . Codeine     swelling    REVIEW OF SYSTEMS:   CONSTITUTIONAL: No fever, fatigue or weakness.  EYES: No blurred or double vision.  EARS, NOSE, AND THROAT: No tinnitus or ear pain.  RESPIRATORY: No cough, shortness of breath, wheezing or hemoptysis.  CARDIOVASCULAR: Has chest pain,  No orthopnea, edema.  GASTROINTESTINAL: No nausea, vomiting, diarrhea or abdominal pain.  GENITOURINARY: No dysuria, hematuria.  ENDOCRINE: No polyuria, nocturia,  HEMATOLOGY: No anemia, easy bruising or bleeding SKIN: No rash or lesion. MUSCULOSKELETAL: No joint pain or arthritis.   NEUROLOGIC: No tingling, numbness, weakness.  PSYCHIATRY: No anxiety or depression.   MEDICATIONS AT HOME:  Prior to Admission medications   Medication Sig Start Date End Date Taking? Authorizing Provider  amLODipine (NORVASC) 5 MG tablet Take 5 mg by mouth daily.    Historical Provider, MD  aspirin 81 MG tablet Take 81 mg by mouth daily.    Historical Provider, MD  b complex-C-folic acid 1 MG capsule Take 1 capsule by mouth daily.    Historical Provider, MD  carvedilol (COREG) 6.25 MG tablet Take 1 tablet by mouth daily. 08/23/16  Historical Provider, MD  cinacalcet (SENSIPAR) 30 MG tablet Take 30 mg by mouth daily.    Historical Provider, MD  enalapril (VASOTEC) 5 MG tablet Take 5 mg by mouth daily.    Historical Provider, MD  metoprolol (LOPRESSOR) 100 MG tablet Take 100 mg by mouth every 12 (twelve) hours.    Historical Provider, MD  valsartan (DIOVAN) 160 MG tablet Take 1 tablet by mouth 2 (two) times daily. 08/07/16   Historical Provider, MD      PHYSICAL EXAMINATION:   VITAL SIGNS: Blood pressure (!) 160/92, pulse 80, temperature 98.4 F (36.9 C), temperature source Oral, resp. rate  18, height 5' 11.65" (1.82 m), weight 84.4 kg (186 lb), SpO2 99 %.  GENERAL:  61 y.o.-year-old patient lying in the bed with no acute distress.  EYES: Pupils equal, round, reactive to light and accommodation. No scleral icterus. Extraocular muscles intact.  HEENT: Head atraumatic, normocephalic. Oropharynx and nasopharynx clear.  NECK:  Supple, no jugular venous distention. No thyroid enlargement, no tenderness.  LUNGS: Normal breath sounds bilaterally, no wheezing, rales,rhonchi or crepitation. No use of accessory muscles of respiration.  CARDIOVASCULAR: S1, S2 normal. No murmurs, rubs, or gallops.  ABDOMEN: Soft, nontender, nondistended. Bowel sounds present. No organomegaly or mass.  EXTREMITIES: No pedal edema, cyanosis, or clubbing.  Left fore arm fistula noted NEUROLOGIC: Cranial nerves II through XII are intact. Muscle strength 5/5 in all extremities. Sensation intact. Gait not checked.  PSYCHIATRIC: The patient is alert and oriented x 3.  SKIN: No obvious rash, lesion, or ulcer.   LABORATORY PANEL:   CBC  Recent Labs Lab 08/28/16 0456  WBC 9.4  HGB 9.6*  HCT 27.8*  PLT 219  MCV 94.3  MCH 32.4  MCHC 34.4  RDW 15.1*   ------------------------------------------------------------------------------------------------------------------  Chemistries   Recent Labs Lab 08/28/16 0456  NA 142  K 5.7*  CL 104  CO2 23  GLUCOSE 99  BUN 67*  CREATININE 14.00*  CALCIUM 7.9*   ------------------------------------------------------------------------------------------------------------------ estimated creatinine clearance is 6.2 mL/min (by C-G formula based on SCr of 14 mg/dL (H)). ------------------------------------------------------------------------------------------------------------------ No results for input(s): TSH, T4TOTAL, T3FREE, THYROIDAB in the last 72 hours.  Invalid input(s): FREET3   Coagulation profile No results for input(s): INR, PROTIME in the last 168  hours. ------------------------------------------------------------------------------------------------------------------- No results for input(s): DDIMER in the last 72 hours. -------------------------------------------------------------------------------------------------------------------  Cardiac Enzymes  Recent Labs Lab 08/28/16 0456  TROPONINI 0.04*   ------------------------------------------------------------------------------------------------------------------ Invalid input(s): POCBNP  ---------------------------------------------------------------------------------------------------------------  Urinalysis    Component Value Date/Time   COLORURINE Yellow 06/17/2012 0739   APPEARANCEUR Cloudy 06/17/2012 0739   LABSPEC 1.009 06/17/2012 0739   PHURINE 9.0 06/17/2012 0739   GLUCOSEU 50 mg/dL 06/17/2012 0739   HGBUR 1+ 06/17/2012 0739   BILIRUBINUR Negative 06/17/2012 0739   KETONESUR Negative 06/17/2012 0739   PROTEINUR 100 mg/dL 06/17/2012 0739   NITRITE Negative 06/17/2012 0739   LEUKOCYTESUR Trace 06/17/2012 0739     RADIOLOGY: Dg Chest 2 View  Result Date: 08/28/2016 CLINICAL DATA:  Shortness of breath and chest pain EXAM: CHEST  2 VIEW COMPARISON:  Chest radiograph 12/04/2012 FINDINGS: The lungs are well inflated. Cardiomediastinal silhouette is normal. There is central pulmonary vascular congestion and linear opacities along the periphery of the lungs. No pleural effusion or pneumothorax. No focal consolidation. IMPRESSION: Mild interstitial pulmonary edema. Electronically Signed   By: Ulyses Jarred M.D.   On: 08/28/2016 05:02    EKG: Orders placed or performed during the hospital encounter of 08/28/16  .  ED EKG within 10 minutes  . ED EKG within 10 minutes    IMPRESSION AND PLAN: 61 year old male patient with history of end-stage renal disease on dialysis, cardiomyopathy, hypertension, gout, glaucoma, hyperparathyroidism presented to the emergency room  with chest pain and an episode of shortness of breath. Admitting diagnosis 1. Chest pain 2. Cardiomyopathy 3. Uncontrolled hypertension 4. End-stage renal disease on dialysis 5. Hyperkalemia Treatment plan Admit patient to telemetry observation bed Cycle troponin to rule out ischemia Check echocardiogram Nephrology consultation for dialysis Cardiology consultation Resume beta blocker, calcium channel blocker for control of blood pressure along with when necessary labetalol Aspirin 81 mg orally daily When necessary nitrates for chest pain DVT prophylaxis subcutaneous heparin Supportive care.  All the records are reviewed and case discussed with ED provider. Management plans discussed with the patient, family and they are in agreement.  CODE STATUS:FULL CODE Code Status History    This patient does not have a recorded code status. Please follow your organizational policy for patients in this situation.       TOTAL TIME TAKING CARE OF THIS PATIENT: 50 minutes.    Saundra Shelling M.D on 08/28/2016 at 7:08 AM  Between 7am to 6pm - Pager - [phone]  After 6pm go to [email or web address] - password [email or web address] [facility]  Office  [phone]  CC: Primary care physician; Letta Median, MD

## 2016-08-28 NOTE — ED Notes (Signed)
Remains to wait on admit bed. NAD

## 2016-08-28 NOTE — ED Provider Notes (Signed)
[facility] Emergency Department Provider Note    I have reviewed the triage vital signs and the nursing notes.   HISTORY  Chief Complaint Chest Pain   HPI Christopher Hickman is a 61 y.o. male with bolus of chronic medical conditions including chronic renal failure with dialysis Tuesday Thursday Saturday presents to the emergency department from dialysis with history of awakening at 1:00 this morning with difficulty breathing and "heart racing". Patient admits to left chest pain with radiation to his upper back worse with deep inspiration. Patient denies any auditory pain or swelling. Patient denies any missed dialysis sessions. Patient states his current pain score is 5 out of 10   Past Medical History:  Diagnosis Date  . A-fib ([place])   . Anemia of chronic kidney failure   . Anxiety and depression   . Cardiomyopathy ([place])    EF 35-40%  . End stage renal disease ([place])   . End stage renal disease ([place])   . Glaucoma   . Gout   . Hyperparathyroidism ([place])   . Hypertension   . Right renal mass     Patient Active Problem List   Diagnosis Date Noted  . Chest pain 08/28/2016    Past Surgical History:  Procedure Laterality Date  . CARDIAC CATHETERIZATION  07/2004   [facility]; ef60%  . left arm av graft    . NEPHRECTOMY    . SHOULDER SURGERY      Prior to Admission medications   Medication Sig Start Date End Date Taking? Authorizing Provider  amLODipine (NORVASC) 5 MG tablet Take 5 mg by mouth daily.    Historical Provider, MD  aspirin 81 MG tablet Take 81 mg by mouth daily.    Historical Provider, MD  b complex-C-folic acid 1 MG capsule Take 1 capsule by mouth daily.    Historical Provider, MD  carvedilol (COREG) 6.25 MG tablet Take 1 tablet by mouth daily. 08/23/16   Historical Provider, MD  cinacalcet (SENSIPAR) 30 MG tablet Take 30 mg by mouth daily.    Historical Provider, MD  enalapril (VASOTEC) 5 MG tablet Take 5 mg by mouth daily.    Historical  Provider, MD  metoprolol (LOPRESSOR) 100 MG tablet Take 100 mg by mouth every 12 (twelve) hours.    Historical Provider, MD  valsartan (DIOVAN) 160 MG tablet Take 1 tablet by mouth 2 (two) times daily. 08/07/16   Historical Provider, MD    Allergies Codeine  No family history on file.  Social History Social History  Substance Use Topics  . Smoking status: Never Smoker  . Smokeless tobacco: Never Used  . Alcohol use No    Review of Systems Constitutional: No fever/chills Eyes: No visual changes. ENT: No sore throat. Cardiovascular: Positive for chest pain. Respiratory: Positive for shortness of breath. Gastrointestinal: No abdominal pain.  No nausea, no vomiting.  No diarrhea.  No constipation. Genitourinary: Negative for dysuria. Musculoskeletal: Negative for back pain. Skin: Negative for rash. Neurological: Negative for headaches, focal weakness or numbness.  10-point ROS otherwise negative.  ____________________________________________   PHYSICAL EXAM:  VITAL SIGNS: ED Triage Vitals  Enc Vitals Group     BP 08/28/16 0415 (!) 211/105     Pulse Rate 08/28/16 0415 (!) 112     Resp 08/28/16 0415 20     Temp 08/28/16 0415 98.4 F (36.9 C)     Temp Source 08/28/16 0415 Oral     SpO2 08/28/16 0415 98 %  Weight 08/28/16 0420 186 lb (84.4 kg)     Height 08/28/16 0420 5' 11.65" (1.82 m)     Head Circumference --      Peak Flow --      Pain Score --      Pain Loc --      Pain Edu? --      Excl. in [facility]? --     Constitutional: Alert and oriented. Well appearing and in no acute distress. Eyes: Conjunctivae are normal. PERRL. EOMI. Head: Atraumatic. Mouth/Throat: Mucous membranes are moist.  Oropharynx non-erythematous. Neck: No stridor.  No meningeal signs.  No cervical spine tenderness to palpation. Cardiovascular: Normal rate, regular rhythm. Good peripheral circulation. Grossly normal heart sounds. Respiratory: Normal respiratory effort.  No retractions. Lungs  CTAB. Gastrointestinal: Soft and nontender. No distention.  Musculoskeletal: No lower extremity tenderness nor edema. No gross deformities of extremities. Neurologic:  Normal speech and language. No gross focal neurologic deficits are appreciated.  Skin:  Skin is warm, dry and intact. No rash noted. Psychiatric: Mood and affect are normal. Speech and behavior are normal.  ____________________________________________   LABS (all labs ordered are listed, but only abnormal results are displayed)  Labs Reviewed  BASIC METABOLIC PANEL - Abnormal; Notable for the following:       Result Value   Potassium 5.7 (*)    BUN 67 (*)    Creatinine, Ser 14.00 (*)    Calcium 7.9 (*)    GFR calc non Af Amer 3 (*)    GFR calc Af Amer 4 (*)    All other components within normal limits  CBC - Abnormal; Notable for the following:    RBC 2.95 (*)    Hemoglobin 9.6 (*)    HCT 27.8 (*)    RDW 15.1 (*)    All other components within normal limits  TROPONIN I - Abnormal; Notable for the following:    Troponin I 0.04 (*)    All other components within normal limits   ____________________________________________  EKG  ED ECG REPORT I, [employer] N Jamesrobert Ohanesian, the attending physician, personally viewed and interpreted this ECG.   Date: 08/29/2016  EKG Time: 4:26 AM  Rate: 108  Rhythm: Sinus tachycardia  Axis: Normal  Intervals: Normal  ST&T Change: None  ____________________________________________  RADIOLOGY I, Sanger N Darral Rishel, personally viewed and evaluated these images (plain radiographs) as part of my medical decision making, as well as reviewing the written report by the radiologist.  Dg Chest 2 View  Result Date: 08/28/2016 CLINICAL DATA:  Shortness of breath and chest pain EXAM: CHEST  2 VIEW COMPARISON:  Chest radiograph 12/04/2012 FINDINGS: The lungs are well inflated. Cardiomediastinal silhouette is normal. There is central pulmonary vascular congestion and linear opacities along the  periphery of the lungs. No pleural effusion or pneumothorax. No focal consolidation. IMPRESSION: Mild interstitial pulmonary edema. Electronically Signed   By: Ulyses Jarred M.D.   On: 08/28/2016 05:02     Procedures     INITIAL IMPRESSION / ASSESSMENT AND PLAN / ED COURSE  Pertinent labs & imaging results that were available during my care of the patient were reviewed by me and considered in my medical decision making (see chart for details).  Patient presents with progressive dyspnea and chest pain markedly hypertensive systolic blood pressure 710. Suspect possible demand ischemia troponin 0.04. Patient given labetalol 20 mg emergency department improvement of blood pressure and symptoms. As such patient discussed with Dr.Pyreddy for hospital admission for further evaluation and  management      ____________________________________________  FINAL CLINICAL IMPRESSION(S) / ED DIAGNOSES Chest pain Cardiomyopathy Uncontrolled hypertension   MEDICATIONS GIVEN DURING THIS VISIT:  Medications  labetalol (NORMODYNE,TRANDATE) injection 20 mg (20 mg Intravenous Given 08/28/16 0523)     NEW OUTPATIENT MEDICATIONS STARTED DURING THIS VISIT:  New Prescriptions   No medications on file    Modified Medications   No medications on file    Discontinued Medications   No medications on file     Note:  This document was prepared using Dragon voice recognition software and may include unintentional dictation errors.    Gregor Hams, MD 08/29/16 [phone]

## 2016-08-28 NOTE — Progress Notes (Signed)
Post hd vitals 

## 2016-08-28 NOTE — ED Notes (Signed)
Pt alert and oriented. Denies pain currently. NAD. Skin warm and dry.VSS

## 2016-08-28 NOTE — ED Triage Notes (Signed)
Patient ambulatory to triage with steady gait, without difficulty or distress noted; pt awoke at 1am with sensation of difficulty breathing, heart racing; st hx of same 2012 and was told it was symptoms of kidney failure; pt reports feeling "tired"; st upper chest/back pain with deep breathing; dialysis Tues-Thurs-Sat

## 2016-08-28 NOTE — Progress Notes (Signed)
Pre hd info 

## 2016-08-28 NOTE — ED Notes (Signed)
Admitting MD at bedside.  Dialysis also called and is ready for patient.

## 2016-08-28 NOTE — Progress Notes (Signed)
Post hd assesment

## 2016-08-28 NOTE — Progress Notes (Addendum)
Patient ID: Rashan Rounsaville, male   DOB: May 26, 1956, 61 y.o.   MRN: [id number]  [employer] PROGRESS NOTE  Taavi Hoose [facility] RSW:[id number] DOB: 1955-08-11 DOA: 08/28/2016 PCP: Letta Median, MD  HPI/Subjective: Translator able to come and help with communication with the patient. Patient states that he woke up at 12 midnight with some shortness of breath and some cough and felt like something got stuck. Also had a little chest pain in the upper chest at that time. Patient states that he's been eating a lot of ham biscuits recently  Objective: Vitals:   08/28/16 1520 08/28/16 1550  BP: (!) 168/100 (!) 166/90  Pulse: 64 66  Resp: 13 16  Temp:     No intake or output data in the 24 hours ending 08/28/16 1555 Filed Weights   08/28/16 0420 08/28/16 1440  Weight: 84.4 kg (186 lb) 84.6 kg (186 lb 8.2 oz)    ROS: Review of Systems  Constitutional: Negative for chills and fever.  Eyes: Negative for blurred vision.  Respiratory: Positive for cough and shortness of breath.   Cardiovascular: Positive for chest pain.  Gastrointestinal: Negative for abdominal pain, constipation, diarrhea, nausea and vomiting.  Genitourinary: Negative for dysuria.  Musculoskeletal: Negative for joint pain.  Neurological: Negative for dizziness and headaches.   Exam: Physical Exam  Constitutional: He is oriented to person, place, and time.  HENT:  Nose: No mucosal edema.  Mouth/Throat: No oropharyngeal exudate or posterior oropharyngeal edema.  Eyes: Conjunctivae, EOM and lids are normal. Pupils are equal, round, and reactive to light.  Neck: No JVD present. Carotid bruit is not present. No edema present. No thyroid mass and no thyromegaly present.  Cardiovascular: S1 normal and S2 normal.  Exam reveals no gallop.   No murmur heard. Pulses:      Dorsalis pedis pulses are 2+ on the right side, and 2+ on the left side.  Respiratory: No respiratory distress. He has no wheezes. He has no  rhonchi. He has no rales.  GI: Soft. Bowel sounds are normal. There is no tenderness.  Musculoskeletal:       Right ankle: He exhibits no swelling.       Left ankle: He exhibits no swelling.  Lymphadenopathy:    He has no cervical adenopathy.  Neurological: He is alert and oriented to person, place, and time. No cranial nerve deficit.  Skin: Skin is warm. No rash noted. Nails show no clubbing.  Psychiatric: He has a normal mood and affect.      Data Reviewed: Basic Metabolic Panel:  Recent Labs Lab 08/28/16 0456  NA 142  K 5.7*  CL 104  CO2 23  GLUCOSE 99  BUN 67*  CREATININE 14.00*  CALCIUM 7.9*   CBC:  Recent Labs Lab 08/28/16 0456  WBC 9.4  HGB 9.6*  HCT 27.8*  MCV 94.3  PLT 219   Cardiac Enzymes:  Recent Labs Lab 08/28/16 0456 08/28/16 0910  TROPONINI 0.04* 0.04*     Studies: Dg Chest 2 View  Result Date: 08/28/2016 CLINICAL DATA:  Shortness of breath and chest pain EXAM: CHEST  2 VIEW COMPARISON:  Chest radiograph 12/04/2012 FINDINGS: The lungs are well inflated. Cardiomediastinal silhouette is normal. There is central pulmonary vascular congestion and linear opacities along the periphery of the lungs. No pleural effusion or pneumothorax. No focal consolidation. IMPRESSION: Mild interstitial pulmonary edema. Electronically Signed   By: Ulyses Jarred M.D.   On: 08/28/2016 05:02    Scheduled Meds: . amLODipine  5 mg Oral Daily  . [START ON 08/29/2016] aspirin EC  81 mg Oral Daily  . cinacalcet  30 mg Oral Q breakfast  . [START ON 08/30/2016] epoetin (EPOGEN/PROCRIT) injection  4,000 Units Intravenous Q T,Th,Sa-HD  . heparin  5,000 Units Subcutaneous Q8H  . irbesartan  150 mg Oral BID  . metoprolol  100 mg Oral Q12H  . multivitamin  1 tablet Oral QHS  . sodium chloride flush  3 mL Intravenous Q12H   Continuous Infusions: . sodium chloride    . sodium chloride    . sodium chloride      Assessment/Plan:  1. Accelerated hypertension. Given  labetalol couple times in the ER. Looks like cardiology ordered large dose of metoprolol. In the hospital he was switch his Diovan over to irbesartan. Patient also looks like he takes Norvasc. Continue to monitor blood pressure. 2. Acute CHF with mild fluid overload. Dialysis to manage fluid. Discussed low salt diet and stopping these Ham biscuits. Echocardiogram ordered. 3. Hyperkalemia patient will be set up for dialysis today 4. End-stage renal disease on dialysis today 5. Borderline troponin likely demand ischemia from accelerated hypertension versus false positive with end-stage renal disease.  Code Status:     Code Status Orders        Start     Ordered   08/28/16 0904  Full code  Continuous     08/28/16 0903    Code Status History    Date Active Date Inactive Code Status Order ID Comments User Context   This patient has a current code status but no historical code status.      Disposition Plan: Likely home tomorrow  Consultants:  Cardiology  Nephrology  Time spent: 35 minutes  Loletha Grayer  [employer]

## 2016-08-28 NOTE — ED Notes (Signed)
MDs at bedside

## 2016-08-28 NOTE — Care Management Obs Status (Signed)
[facility] NOTIFICATION   Patient Details  Name: Christopher Hickman MRN: [id number] Date of Birth: 14-Dec-1955   Medicare Observation Status Notification Given:  Yes    Beau Fanny, RN 08/28/2016, 9:46 AM

## 2016-08-29 DIAGNOSIS — I428 Other cardiomyopathies: Secondary | ICD-10-CM

## 2016-08-29 DIAGNOSIS — I160 Hypertensive urgency: Secondary | ICD-10-CM

## 2016-08-29 DIAGNOSIS — N186 End stage renal disease: Secondary | ICD-10-CM

## 2016-08-29 DIAGNOSIS — Z992 Dependence on renal dialysis: Secondary | ICD-10-CM | POA: Diagnosis not present

## 2016-08-29 DIAGNOSIS — R633 Feeding difficulties: Secondary | ICD-10-CM | POA: Diagnosis not present

## 2016-08-29 DIAGNOSIS — R079 Chest pain, unspecified: Secondary | ICD-10-CM | POA: Diagnosis not present

## 2016-08-29 LAB — LIPID PANEL
CHOL/HDL RATIO: 3.3 ratio
Cholesterol: 192 mg/dL (ref 0–200)
HDL: 58 mg/dL (ref >40)
LDL Cholesterol: 122 mg/dL — ABNORMAL HIGH (ref 0–99)
Triglycerides: 59 mg/dL (ref <150)
VLDL: 12 mg/dL (ref 0–40)

## 2016-08-29 LAB — HEPATITIS B SURFACE ANTIGEN: HEP B S AG: NEGATIVE

## 2016-08-29 LAB — PARATHYROID HORMONE, INTACT (NO CA): PTH: 90 pg/mL — AB (ref 15–65)

## 2016-08-29 LAB — BASIC METABOLIC PANEL
Anion gap: 7 (ref 5–15)
BUN: 33 mg/dL — ABNORMAL HIGH (ref 6–20)
CO2: 34 mmol/L — ABNORMAL HIGH (ref 22–32)
CREATININE: 8.67 mg/dL — AB (ref 0.61–1.24)
Calcium: 8.2 mg/dL — ABNORMAL LOW (ref 8.9–10.3)
Chloride: 98 mmol/L — ABNORMAL LOW (ref 101–111)
GFR calc Af Amer: 7 mL/min — ABNORMAL LOW (ref >60)
GFR, EST NON AFRICAN AMERICAN: 6 mL/min — AB (ref >60)
GLUCOSE: 87 mg/dL (ref 65–99)
POTASSIUM: 6.4 mmol/L — AB (ref 3.5–5.1)
Sodium: 139 mmol/L (ref 135–145)

## 2016-08-29 LAB — ECHOCARDIOGRAM COMPLETE
Height: 71.654 in
Weight: 2917.13 oz

## 2016-08-29 MED ORDER — HYDRALAZINE HCL 25 MG PO TABS
25.0000 mg | ORAL_TABLET | Freq: Three times a day (TID) | ORAL | Status: DC
  Administered 2016-08-29: 25 mg via ORAL
  Filled 2016-08-29: qty 1

## 2016-08-29 MED ORDER — METOPROLOL TARTRATE 100 MG PO TABS: 100.0000 mg | ORAL_TABLET | Freq: Two times a day (BID) | ORAL | 0 refills | Status: DC

## 2016-08-29 MED ORDER — HYDRALAZINE HCL 50 MG PO TABS
50.0000 mg | ORAL_TABLET | Freq: Three times a day (TID) | ORAL | Status: DC
  Administered 2016-08-29: 50 mg via ORAL

## 2016-08-29 MED ORDER — HYDRALAZINE HCL 25 MG PO TABS: 25.0000 mg | ORAL_TABLET | Freq: Three times a day (TID) | ORAL | 0 refills | Status: DC

## 2016-08-29 MED ORDER — HYDRALAZINE HCL 50 MG PO TABS: 50.0000 mg | ORAL_TABLET | Freq: Three times a day (TID) | ORAL | 0 refills | Status: DC

## 2016-08-29 NOTE — Progress Notes (Signed)
HD completed without issue. Patient tolerated well.

## 2016-08-29 NOTE — Progress Notes (Signed)
Paged Dr. Leslye Peer about patient's BP.  He ordered 25mg  of hydralazine to be given now as well as PM dose of metoprolol.  Orders to recheck in 1hr and discharge if appropriate.    Dr. Rockey Situ on the floor, he decided to up the hydralazine to 50mg  q8hr instead.    Dr. Posey Pronto to reconcile medications and possibly discharge patient if appropriate in 1hr.   Called translator to explain all of this to patient.  Will recheck BP in 1hr and reassess if appropriate to discharge.

## 2016-08-29 NOTE — Progress Notes (Signed)
Pt arrived via stretcher from dialysis. Pt A&O. Pt speaks Spanish.Telemetry box placed and called to CCMD. Pt has medication  In pharmacy. Yellow socks applied, oriented to room and call bell. Blood pressure checked. Medication given

## 2016-08-29 NOTE — Progress Notes (Signed)
Pre HD  

## 2016-08-29 NOTE — Progress Notes (Signed)
[facility]  ROUNDING NOTE   Subjective:  Potassium was still quite high today at 6.4. Therefore an additional dialysis treatment was planned.  Objective:  Vital signs in last 24 hours:  Temp:  [98 F (36.7 C)-98.8 F (37.1 C)] 98.8 F (37.1 C) (02/21 1640) Pulse Rate:  [62-75] 65 (02/21 1642) Resp:  [10-18] 15 (02/21 1642) BP: (154-195)/(82-104) 185/95 (02/21 1647) SpO2:  [94 %-100 %] 99 % (02/21 1642) Weight:  [81.1 kg (178 lb 12.8 oz)-83.5 kg (184 lb 1.4 oz)] 82.5 kg (181 lb 14.1 oz) (02/21 1640)  Weight change: 0.231 kg (8.2 oz) Filed Weights   08/29/16 0445 08/29/16 1335 08/29/16 1640  Weight: 83.5 kg (184 lb) 83.5 kg (184 lb 1.4 oz) 82.5 kg (181 lb 14.1 oz)    Intake/Output: I/O last 3 completed shifts: In: -  Out: 2000 [Other:2000]   Intake/Output this shift:  Total I/O In: 240 [P.O.:240] Out: 924 [Other:924]  Physical Exam: General: No acute distress  Head: Normocephalic, atraumatic. Moist oral mucosal membranes  Eyes: Anicteric  Neck: Supple, trachea midline  Lungs:  Clear to auscultation, normal effort  Heart: S1S2 no rubs  Abdomen:  Soft, nontender, bowel sounds present  Extremities: traceperipheral edema.  Neurologic: Nonfocal, moving all four extremities  Skin: No lesions  Access: Left forearm AVF    Basic Metabolic Panel:  Recent Labs Lab 08/28/16 0456 08/28/16 1450 08/29/16 0610  NA 142  --  139  K 5.7*  --  6.4*  CL 104  --  98*  CO2 23  --  34*  GLUCOSE 99  --  87  BUN 67*  --  33*  CREATININE 14.00*  --  8.67*  CALCIUM 7.9*  --  8.2*  PHOS  --  6.5*  --     Liver Function Tests: No results for input(s): AST, ALT, ALKPHOS, BILITOT, PROT, ALBUMIN in the last 168 hours. No results for input(s): LIPASE, AMYLASE in the last 168 hours. No results for input(s): AMMONIA in the last 168 hours.  CBC:  Recent Labs Lab 08/28/16 0456  WBC 9.4  HGB 9.6*  HCT 27.8*  MCV 94.3  PLT 219    Cardiac Enzymes:  Recent  Labs Lab 08/28/16 0456 08/28/16 0910 08/28/16 1450 08/28/16 2055  TROPONINI 0.04* 0.04* 0.04* 0.03*    BNP: Invalid input(s): POCBNP  CBG: No results for input(s): GLUCAP in the last 168 hours.  Microbiology: Results for orders placed or performed during the hospital encounter of 08/28/16  MRSA PCR Screening     Status: None   Collection Time: 08/28/16  9:10 PM  Result Value Ref Range Status   MRSA by PCR NEGATIVE NEGATIVE Final    Comment:        The GeneXpert MRSA Assay (FDA approved for NASAL specimens only), is one component of a comprehensive MRSA colonization surveillance program. It is not intended to diagnose MRSA infection nor to guide or monitor treatment for MRSA infections.     Coagulation Studies: No results for input(s): LABPROT, INR in the last 72 hours.  Urinalysis: No results for input(s): COLORURINE, LABSPEC, PHURINE, GLUCOSEU, HGBUR, BILIRUBINUR, KETONESUR, PROTEINUR, UROBILINOGEN, NITRITE, LEUKOCYTESUR in the last 72 hours.  Invalid input(s): APPERANCEUR    Imaging: Dg Chest 2 View  Result Date: 08/28/2016 CLINICAL DATA:  Shortness of breath and chest pain EXAM: CHEST  2 VIEW COMPARISON:  Chest radiograph 12/04/2012 FINDINGS: The lungs are well inflated. Cardiomediastinal silhouette is normal. There is central pulmonary vascular congestion and  linear opacities along the periphery of the lungs. No pleural effusion or pneumothorax. No focal consolidation. IMPRESSION: Mild interstitial pulmonary edema. Electronically Signed   By: Ulyses Jarred M.D.   On: 08/28/2016 05:02     Medications:    . amLODipine  10 mg Oral Daily  . aspirin EC  81 mg Oral Daily  . cinacalcet  30 mg Oral Q breakfast  . [START ON 08/30/2016] epoetin (EPOGEN/PROCRIT) injection  4,000 Units Intravenous Q T,Th,Sa-HD  . heparin  5,000 Units Subcutaneous Q8H  . hydrALAZINE  25 mg Oral Q8H  . irbesartan  150 mg Oral BID  . metoprolol  100 mg Oral Q12H  . multivitamin  1  tablet Oral QHS  . sodium chloride flush  3 mL Intravenous Q12H   sodium chloride, sodium chloride, sodium chloride, acetaminophen, alteplase, heparin, labetalol, lidocaine (PF), lidocaine-prilocaine, nitroGLYCERIN, ondansetron (ZOFRAN) IV, pentafluoroprop-tetrafluoroeth, sodium chloride flush  Assessment/ Plan:  61 y.o. male Hispanic male with past medical history of ESRD on HD TTS, gout, secondary hyperparathyroidism, hypertension, atrial fibrillation, anemia of chronic kidney disease, cardio myopathy ejection fraction 35-40% who presented with chest pain.  [facility] [place]  1.  Hyperkalemia. 2.  Anemia of CKD. 3.  Chest pain  Plan:  Calcium was still quite high today at 6.4. Therefore we will plan for an additional dialysis treatment today. This was discussed with dialysis nursing. He will resume his normal outpatient dialysis sessions tomorrow. We will continue to monitor his progress closely.   LOS: 0 Kaulder Zahner 2/21/20184:51 PM

## 2016-08-29 NOTE — Discharge Summary (Signed)
[facility] [place]: Christopher Hickman    MR#:  [id number]  DATE OF BIRTH:  02-01-1956  DATE OF ADMISSION:  08/28/2016 ADMITTING PHYSICIAN: Saundra Shelling, MD  DATE OF DISCHARGE: 08/29/2016  PRIMARY CARE PHYSICIAN: Letta Median, MD    ADMISSION DIAGNOSIS:  diff breathing poss med refill  DISCHARGE DIAGNOSIS:  Principal Problem:   Hypertensive urgency Active Problems:   Chest pain   Dietary indiscretion   ESRD (end stage renal disease) on dialysis ([place])   NICM (nonischemic cardiomyopathy) ([place])   SECONDARY DIAGNOSIS:   Past Medical History:  Diagnosis Date  . Anemia of chronic kidney failure   . Anxiety and depression   . Cardiomyopathy ([place])    a. prior EF 40%, now with normal EF as of 02/2016  . End stage renal disease ([place])   . Glaucoma   . Gout   . Hyperparathyroidism ([place])   . Hypertension   . Right renal mass     HOSPITAL COURSE:   1.  Accelerated hypertension. Blood pressure trending better. Restarted metoprolol. New Prescription for hydralazine. Continue Diovan and Norvasc as outpatient. 2. Acute systolic congestive heart failure and fluid overload. Dialysis needed to remove fluid. Patient on metoprolol and irbesartan. Spironolactone not a good medication in dialysis patients. Referred to the CHF clinic. 3. End-stage renal disease on dialysis. Require dialysis 2 days in a row. 4. Hyperkalemia. Low potassium bath with dialysis. Low potassium diet given to the patient. 5. Borderline troponin demand ischemia from fluid overload accelerated hypertension 6. History of renal mass with nephrectomy 7. Anemia of chronic disease   DISCHARGE CONDITIONS:   Satisfactory  CONSULTS OBTAINED:  Treatment Team:  Nelva Bush, MD  DRUG ALLERGIES:   Allergies  Allergen Reactions  . Codeine     swelling    DISCHARGE MEDICATIONS:   Current Discharge Medication List    START taking these medications    Details  hydrALAZINE (APRESOLINE) 25 MG tablet Take 1 tablet (25 mg total) by mouth every 8 (eight) hours. Qty: 90 tablet, Refills: 0      CONTINUE these medications which have CHANGED   Details  metoprolol (LOPRESSOR) 100 MG tablet Take 1 tablet (100 mg total) by mouth every 12 (twelve) hours. Qty: 60 tablet, Refills: 0      CONTINUE these medications which have NOT CHANGED   Details  amLODipine (NORVASC) 10 MG tablet Take 10 mg by mouth daily.     aspirin 81 MG tablet Take 81 mg by mouth 2 (two) times daily.     b complex-C-folic acid 1 MG capsule Take 1 capsule by mouth daily.    calcium acetate (PHOSLO) 667 MG capsule Take 2,001 mg by mouth 3 (three) times daily with meals.    cinacalcet (SENSIPAR) 30 MG tablet Take 60 mg by mouth daily.     valsartan (DIOVAN) 160 MG tablet Take 1 tablet by mouth 2 (two) times daily.         DISCHARGE INSTRUCTIONS:  Follow-up PMD one week Follow-up dialysis is usually scheduled Follow-up with CHF clinic  If you experience worsening of your admission symptoms, develop shortness of breath, life threatening emergency, suicidal or homicidal thoughts you must seek medical attention immediately by calling 911 or calling your MD immediately  if symptoms less severe.  You Must read complete instructions/literature along with all the possible adverse reactions/side effects for all the Medicines you take and that have been prescribed to you. Take  any new Medicines after you have completely understood and accept all the possible adverse reactions/side effects.   Please note  You were cared for by a hospitalist during your hospital stay. If you have any questions about your discharge medications or the care you received while you were in the hospital after you are discharged, you can call the unit and asked to speak with the hospitalist on call if the hospitalist that took care of you is not available. Once you are discharged, your primary care  physician will handle any further medical issues. Please note that NO REFILLS for any discharge medications will be authorized once you are discharged, as it is imperative that you return to your primary care physician (or establish a relationship with a primary care physician if you do not have one) for your aftercare needs so that they can reassess your need for medications and monitor your lab values.    Today   CHIEF COMPLAINT:   Chief Complaint  Patient presents with  . Chest Pain    HISTORY OF PRESENT ILLNESS:  Christopher Hickman  is a 61 y.o. male presented with chest pain and found to have mild fluid overload   VITAL SIGNS:  Blood pressure (!) 178/87, pulse 64, temperature 98 F (36.7 C), temperature source Oral, resp. rate 16, height 5' 11.65" (1.82 m), weight 83.5 kg (184 lb 1.4 oz), SpO2 99 %.    PHYSICAL EXAMINATION:  GENERAL:  61 y.o.-year-old patient lying in the bed with no acute distress.  EYES: Pupils equal, round, reactive to light and accommodation. No scleral icterus. Extraocular muscles intact.  HEENT: Head atraumatic, normocephalic. Oropharynx and nasopharynx clear.  NECK:  Supple, no jugular venous distention. No thyroid enlargement, no tenderness.  LUNGS: Normal breath sounds bilaterally, no wheezing, rales,rhonchi or crepitation. No use of accessory muscles of respiration.  CARDIOVASCULAR: S1, S2 normal. No murmurs, rubs, or gallops.  ABDOMEN: Soft, non-tender, non-distended. Bowel sounds present. No organomegaly or mass.  EXTREMITIES: No pedal edema, cyanosis, or clubbing.  NEUROLOGIC: Cranial nerves II through XII are intact. Muscle strength 5/5 in all extremities. Sensation intact. Gait not checked.  PSYCHIATRIC: The patient is alert and oriented x 3.  SKIN: No obvious rash, lesion, or ulcer.   DATA REVIEW:   CBC  Recent Labs Lab 08/28/16 0456  WBC 9.4  HGB 9.6*  HCT 27.8*  PLT 219    Chemistries   Recent Labs Lab 08/29/16 0610  NA  139  K 6.4*  CL 98*  CO2 34*  GLUCOSE 87  BUN 33*  CREATININE 8.67*  CALCIUM 8.2*    Cardiac Enzymes  Recent Labs Lab 08/28/16 2055  TROPONINI 0.03*    Microbiology Results  Results for orders placed or performed during the hospital encounter of 08/28/16  MRSA PCR Screening     Status: None   Collection Time: 08/28/16  9:10 PM  Result Value Ref Range Status   MRSA by PCR NEGATIVE NEGATIVE Final    Comment:        The GeneXpert MRSA Assay (FDA approved for NASAL specimens only), is one component of a comprehensive MRSA colonization surveillance program. It is not intended to diagnose MRSA infection nor to guide or monitor treatment for MRSA infections.     RADIOLOGY:  Dg Chest 2 View  Result Date: 08/28/2016 CLINICAL DATA:  Shortness of breath and chest pain EXAM: CHEST  2 VIEW COMPARISON:  Chest radiograph 12/04/2012 FINDINGS: The lungs are well inflated. Cardiomediastinal silhouette is  normal. There is central pulmonary vascular congestion and linear opacities along the periphery of the lungs. No pleural effusion or pneumothorax. No focal consolidation. IMPRESSION: Mild interstitial pulmonary edema. Electronically Signed   By: Ulyses Jarred M.D.   On: 08/28/2016 05:02    Management plans discussed with the patient, family and he is agreement.  CODE STATUS:     Code Status Orders        Start     Ordered   08/28/16 0904  Full code  Continuous     08/28/16 0903    Code Status History    Date Active Date Inactive Code Status Order ID Comments User Context   This patient has a current code status but no historical code status.      TOTAL TIME TAKING CARE OF THIS PATIENT: 35 minutes.    Loletha Grayer M.D on 08/29/2016 at 3:31 PM  Between 7am to 6pm - Pager - [phone]  After 6pm go to [email or web address] - password [email or web address]  [facility] Office  [phone]  CC: Primary care physician; Letta Median, MD

## 2016-08-29 NOTE — Progress Notes (Signed)
Patient Name: Christopher Hickman [facility] Date of Encounter: 08/29/2016  Primary Cardiologist: New to [facility] - consult by End  Hospital Problem List     Principal Problem:   Hypertensive urgency Active Problems:   Dietary indiscretion   ESRD (end stage renal disease) on dialysis [facility])   NICM (nonischemic cardiomyopathy) ([place])   Chest pain     Subjective   Visit done with [facility] medical interpreter. Breathing much better this morning. Underwent HC on 2/20. K+ elevated at 6.4 this AM. BP remains elevated. Echo pending. No chest pain. LE swelling improved.   Inpatient Medications    Scheduled Meds: . amLODipine  10 mg Oral Daily  . aspirin EC  81 mg Oral Daily  . cinacalcet  30 mg Oral Q breakfast  . [START ON 08/30/2016] epoetin (EPOGEN/PROCRIT) injection  4,000 Units Intravenous Q T,Th,Sa-HD  . heparin  5,000 Units Subcutaneous Q8H  . irbesartan  150 mg Oral BID  . metoprolol  100 mg Oral Q12H  . multivitamin  1 tablet Oral QHS  . sodium chloride flush  3 mL Intravenous Q12H   Continuous Infusions:  PRN Meds: sodium chloride, sodium chloride, sodium chloride, acetaminophen, alteplase, heparin, labetalol, lidocaine (PF), lidocaine-prilocaine, nitroGLYCERIN, ondansetron (ZOFRAN) IV, pentafluoroprop-tetrafluoroeth, sodium chloride flush   Vital Signs    Vitals:   08/28/16 2125 08/28/16 2345 08/29/16 0054 08/29/16 0445  BP: (!) 168/84 (!) 165/84 (!) 157/82 (!) 162/90  Pulse: 71  64 73  Resp:    18  Temp:    98.2 F (36.8 C)  TempSrc:    Oral  SpO2: 94%   96%  Weight:    184 lb (83.5 kg)  Height:        Intake/Output Summary (Last 24 hours) at 08/29/16 0845 Last data filed at 08/28/16 2229  Gross per 24 hour  Intake                0 ml  Output             2000 ml  Net            -2000 ml   Filed Weights   08/28/16 1820 08/28/16 2017 08/29/16 0445  Weight: 182 lb 5.1 oz (82.7 kg) 178 lb 12.8 oz (81.1 kg) 184 lb (83.5 kg)    Physical Exam    GEN: Well nourished,  well developed, in no acute distress.  HEENT: Grossly normal.  Neck: Supple, no JVD, carotid bruits, or masses. Cardiac: RRR, no murmurs, rubs, or gallops. No clubbing, cyanosis, edema.  Radials/DP/PT 2+ and equal bilaterally.  Respiratory:  Respirations regular and unlabored, clear to auscultation bilaterally. GI: Soft, nontender, nondistended, BS + x 4. MS: no deformity or atrophy. Skin: warm and dry, no rash. Neuro:  Strength and sensation are intact. Psych: AAOx3.  Normal affect.  Labs    CBC  Recent Labs  08/28/16 0456  WBC 9.4  HGB 9.6*  HCT 27.8*  MCV 94.3  PLT 035   Basic Metabolic Panel  Recent Labs  08/28/16 0456 08/28/16 1450 08/29/16 0610  NA 142  --  139  K 5.7*  --  6.4*  CL 104  --  98*  CO2 23  --  34*  GLUCOSE 99  --  87  BUN 67*  --  33*  CREATININE 14.00*  --  8.67*  CALCIUM 7.9*  --  8.2*  PHOS  --  6.5*  --    Liver Function Tests No results  for input(s): AST, ALT, ALKPHOS, BILITOT, PROT, ALBUMIN in the last 72 hours. No results for input(s): LIPASE, AMYLASE in the last 72 hours. Cardiac Enzymes  Recent Labs  08/28/16 0910 08/28/16 1450 08/28/16 2055  TROPONINI 0.04* 0.04* 0.03*   BNP Invalid input(s): POCBNP D-Dimer No results for input(s): DDIMER in the last 72 hours. Hemoglobin A1C No results for input(s): HGBA1C in the last 72 hours. Fasting Lipid Panel  Recent Labs  08/29/16 0610  CHOL 192  HDL 58  LDLCALC 122*  TRIG 59  CHOLHDL 3.3   Thyroid Function Tests No results for input(s): TSH, T4TOTAL, T3FREE, THYROIDAB in the last 72 hours.  Invalid input(s): FREET3  Telemetry    Sinus rhythm, 60's bpm - Personally Reviewed  ECG    n/a - Personally Reviewed  Radiology    Dg Chest 2 View  Result Date: 08/28/2016 CLINICAL DATA:  Shortness of breath and chest pain EXAM: CHEST  2 VIEW COMPARISON:  Chest radiograph 12/04/2012 FINDINGS: The lungs are well inflated. Cardiomediastinal silhouette is normal. There is  central pulmonary vascular congestion and linear opacities along the periphery of the lungs. No pleural effusion or pneumothorax. No focal consolidation. IMPRESSION: Mild interstitial pulmonary edema. Electronically Signed   By: Ulyses Jarred M.D.   On: 08/28/2016 05:02    Cardiac Studies   TTE pending  Patient Profile     61 y.o. male with history of nonobstructive CAD by cardiac catheterization in 4656, chronic systolic CHF/NICM with prior EF of 40% now improved to 55-60% by echo in 02/2016, ESRD on HD (TTS), HTN, and tobacco abuse who presented to [facility] with increased SOB and LE swelling in the setting of dietary indiscretions eating mostly ham biscuits lately.  Assessment & Plan    1. SOB/volume overload: -Much improved -Likely in the setting of his significant dietary indiscretion, eating mostly ham biscuits lately as well increased amounts of tea leading to an increase in fluid retention, mild volume overload, and accelerated HTN -Recommend addressing the primary problem (see above) and treat BP to goal -Check echo   2. Acute on chronic NICM: -Volume managed by HD (TTS) -Has not missed any HD sessions, though has been eating a diet very high is salt leading to the above -May need daily HD for a couple of days, defer to renal -Still makes some urine, consider adding PO Lasix on no HD days, defer to renal -Check echo, if normal EF no further inpatient cardiac workup needed -Continue current medications  3. Hypertensive urgency: -Remains elevated -Add hydralazine  -Continue remaining medications  4. ESRD: -Per renal -As above  5. Chest pain/elevated troponin: -Minimally elevated likely in the setting of SBP of 211 and ESRD with SCr of 14 -Unlikely a primary ischemic event -As above  6. Hyperkalemia: -Defer to renal given dialysis   7. Remaining per IM  Signed, Christell Faith, PA-C [place] Pager: [phone] 08/29/2016, 8:45 AM   Attending Note Patient  seen and examined, agree with detailed note above,  Patient presentation and plan discussed on rounds.   Long discussion with patient using interpreter this morning  Reports he been doing well until December then for some reason started eating ham biscuit every morning with large drink.  Over the course of the past 2 months he has had ankle swelling, higher blood pressures He has noticed high blood pressures at dialysis Previously used to have low blood pressures at the end of dialysis, this has not been an issue in the  past 2 months Reports he did not mention anything to Dr. Candiss Norse his nephrologist Reports he does make urine, does not take Lasix  On clinical exam lungs are clear, heart sounds regular with no murmurs appreciated, no JVD, abdomen soft nontender, no significant lower extremity edema.  Case discussed with Dr. Holley Raring, renal He is planning dialysis today given elevated potassium  ----Fluid overload/end-stage renal disease on hemodialysis Dietary indiscretion past 2 months Long discussion with him, CHF education provided He reports he will be better will avoid salt and avoid large volume drinks Reports he was doing well all last year  -----Cardiomyopathy, nonischemic Nonobstructive coronary artery disease by catheterization 2013 Currently on ARB, beta blocker Consider repeat echocardiogram in several months time to reevaluate ejection fraction.   ----Hypertension This should improve with dialysis, scheduled for today,  likely also to have regular dialysis on Thursday?   Greater than 50% was spent in counseling and coordination of care with patient Total encounter time 35 minutes or more   Signed: Esmond Plants  M.D., Ph.D. [facility]

## 2016-08-29 NOTE — Progress Notes (Signed)
Post HD assessment unchanged  

## 2016-08-29 NOTE — Progress Notes (Signed)
HD initiated without issue  

## 2017-01-01 ENCOUNTER — Other Ambulatory Visit
Admission: RE | Admit: 2017-01-01 | Discharge: 2017-01-01 | Disposition: A | Discharge status: Home / Self Care | Payer: Medicare Other | Source: Ambulatory Visit | Attending: Internal Medicine | Admitting: Internal Medicine

## 2017-01-01 DIAGNOSIS — N186 End stage renal disease: Secondary | ICD-10-CM | POA: Insufficient documentation

## 2017-01-01 LAB — HEMOGLOBIN: Hemoglobin: 10.7 g/dL — ABNORMAL LOW (ref 13.0–18.0)

## 2017-02-11 ENCOUNTER — Ambulatory Visit
Admission: RE | Admit: 2017-02-11 | Discharge: 2017-02-11 | Disposition: A | Discharge status: Home / Self Care | Payer: MEDICARE

## 2017-02-11 ENCOUNTER — Ambulatory Visit: Admission: RE | Admit: 2017-02-11 | Discharge: 2017-02-11 | Disposition: A | Discharge status: Home / Self Care

## 2017-02-11 DIAGNOSIS — I120 Hypertensive chronic kidney disease with stage 5 chronic kidney disease or end stage renal disease: Secondary | ICD-10-CM

## 2017-02-11 DIAGNOSIS — Z01810 Encounter for preprocedural cardiovascular examination: Secondary | ICD-10-CM

## 2017-02-11 DIAGNOSIS — Z01818 Encounter for other preprocedural examination: Principal | ICD-10-CM

## 2017-02-11 DIAGNOSIS — Z7289 Other problems related to lifestyle: Secondary | ICD-10-CM

## 2017-02-11 DIAGNOSIS — N186 End stage renal disease: Secondary | ICD-10-CM

## 2017-02-12 ENCOUNTER — Ambulatory Visit
Admission: RE | Admit: 2017-02-12 | Discharge: 2017-02-12 | Payer: MEDICARE | Attending: Internal Medicine | Admitting: Internal Medicine

## 2017-02-15 DIAGNOSIS — Z7682 Awaiting organ transplant status: Principal | ICD-10-CM

## 2017-02-15 DIAGNOSIS — N186 End stage renal disease: Secondary | ICD-10-CM

## 2017-02-15 DIAGNOSIS — Z01818 Encounter for other preprocedural examination: Secondary | ICD-10-CM

## 2017-02-25 ENCOUNTER — Ambulatory Visit
Admission: RE | Admit: 2017-02-25 | Discharge: 2017-02-25 | Disposition: A | Discharge status: Home / Self Care | Payer: MEDICARE

## 2017-02-25 ENCOUNTER — Ambulatory Visit: Admission: RE | Admit: 2017-02-25 | Discharge: 2017-02-25 | Disposition: A | Discharge status: Home / Self Care

## 2017-02-25 ENCOUNTER — Ambulatory Visit: Admission: RE | Admit: 2017-02-25 | Discharge: 2017-02-25 | Payer: MEDICARE

## 2017-02-25 DIAGNOSIS — Z01810 Encounter for preprocedural cardiovascular examination: Secondary | ICD-10-CM

## 2017-02-25 DIAGNOSIS — Z7289 Other problems related to lifestyle: Secondary | ICD-10-CM

## 2017-02-25 DIAGNOSIS — I120 Hypertensive chronic kidney disease with stage 5 chronic kidney disease or end stage renal disease: Secondary | ICD-10-CM

## 2017-02-25 DIAGNOSIS — Z01818 Encounter for other preprocedural examination: Principal | ICD-10-CM

## 2017-02-25 DIAGNOSIS — N186 End stage renal disease: Secondary | ICD-10-CM

## 2017-02-25 DIAGNOSIS — Z125 Encounter for screening for malignant neoplasm of prostate: Secondary | ICD-10-CM

## 2017-04-25 ENCOUNTER — Ambulatory Visit
Admission: RE | Admit: 2017-04-25 | Discharge: 2017-04-25 | Disposition: A | Discharge status: Home / Self Care | Payer: MEDICARE | Attending: Internal Medicine | Admitting: Internal Medicine

## 2017-05-02 DIAGNOSIS — Z01818 Encounter for other preprocedural examination: Secondary | ICD-10-CM

## 2017-05-02 DIAGNOSIS — N186 End stage renal disease: Secondary | ICD-10-CM

## 2017-05-02 DIAGNOSIS — Z7682 Awaiting organ transplant status: Principal | ICD-10-CM

## 2017-05-13 ENCOUNTER — Ambulatory Visit: Admission: RE | Admit: 2017-05-13 | Discharge: 2017-05-13 | Disposition: A | Discharge status: Home / Self Care

## 2017-05-13 DIAGNOSIS — Z01818 Encounter for other preprocedural examination: Principal | ICD-10-CM

## 2017-05-21 ENCOUNTER — Ambulatory Visit
Admission: RE | Admit: 2017-05-21 | Discharge: 2017-05-21 | Payer: MEDICARE | Attending: Internal Medicine | Admitting: Internal Medicine

## 2017-05-28 DIAGNOSIS — Z7682 Awaiting organ transplant status: Principal | ICD-10-CM

## 2017-05-28 DIAGNOSIS — Z01818 Encounter for other preprocedural examination: Secondary | ICD-10-CM

## 2017-05-28 DIAGNOSIS — N186 End stage renal disease: Secondary | ICD-10-CM

## 2017-06-10 ENCOUNTER — Ambulatory Visit
Admission: RE | Admit: 2017-06-10 | Discharge: 2017-06-10 | Disposition: A | Discharge status: Home / Self Care | Payer: MEDICARE

## 2017-06-10 DIAGNOSIS — K769 Liver disease, unspecified: Principal | ICD-10-CM

## 2017-06-10 DIAGNOSIS — Z01818 Encounter for other preprocedural examination: Secondary | ICD-10-CM

## 2017-06-10 DIAGNOSIS — N186 End stage renal disease: Secondary | ICD-10-CM

## 2017-07-09 DIAGNOSIS — Z940 Kidney transplant status: Secondary | ICD-10-CM

## 2017-07-09 HISTORY — DX: Kidney transplant status: Z94.0

## 2017-07-23 ENCOUNTER — Inpatient Hospital Stay: Payer: Medicare Other

## 2017-07-23 ENCOUNTER — Inpatient Hospital Stay
Admission: EM | Admit: 2017-07-23 | Discharge: 2017-07-25 | DRG: 640 | Disposition: A | Discharge status: Home / Self Care | Payer: Medicare Other | Attending: Family Medicine | Admitting: Family Medicine

## 2017-07-23 DIAGNOSIS — F17200 Nicotine dependence, unspecified, uncomplicated: Secondary | ICD-10-CM | POA: Diagnosis present

## 2017-07-23 DIAGNOSIS — I498 Other specified cardiac arrhythmias: Secondary | ICD-10-CM | POA: Diagnosis not present

## 2017-07-23 DIAGNOSIS — E875 Hyperkalemia: Principal | ICD-10-CM

## 2017-07-23 DIAGNOSIS — I428 Other cardiomyopathies: Secondary | ICD-10-CM | POA: Diagnosis present

## 2017-07-23 DIAGNOSIS — I248 Other forms of acute ischemic heart disease: Secondary | ICD-10-CM | POA: Diagnosis present

## 2017-07-23 DIAGNOSIS — Z8249 Family history of ischemic heart disease and other diseases of the circulatory system: Secondary | ICD-10-CM

## 2017-07-23 DIAGNOSIS — I132 Hypertensive heart and chronic kidney disease with heart failure and with stage 5 chronic kidney disease, or end stage renal disease: Secondary | ICD-10-CM | POA: Diagnosis present

## 2017-07-23 DIAGNOSIS — N2581 Secondary hyperparathyroidism of renal origin: Secondary | ICD-10-CM | POA: Diagnosis present

## 2017-07-23 DIAGNOSIS — I5022 Chronic systolic (congestive) heart failure: Secondary | ICD-10-CM | POA: Diagnosis present

## 2017-07-23 DIAGNOSIS — N186 End stage renal disease: Secondary | ICD-10-CM

## 2017-07-23 DIAGNOSIS — D631 Anemia in chronic kidney disease: Secondary | ICD-10-CM | POA: Diagnosis present

## 2017-07-23 DIAGNOSIS — K922 Gastrointestinal hemorrhage, unspecified: Secondary | ICD-10-CM | POA: Diagnosis present

## 2017-07-23 DIAGNOSIS — Z885 Allergy status to narcotic agent status: Secondary | ICD-10-CM | POA: Diagnosis not present

## 2017-07-23 DIAGNOSIS — Z992 Dependence on renal dialysis: Secondary | ICD-10-CM | POA: Diagnosis not present

## 2017-07-23 DIAGNOSIS — Z905 Acquired absence of kidney: Secondary | ICD-10-CM

## 2017-07-23 DIAGNOSIS — H409 Unspecified glaucoma: Secondary | ICD-10-CM | POA: Diagnosis present

## 2017-07-23 DIAGNOSIS — Z7982 Long term (current) use of aspirin: Secondary | ICD-10-CM

## 2017-07-23 DIAGNOSIS — R109 Unspecified abdominal pain: Secondary | ICD-10-CM | POA: Diagnosis present

## 2017-07-23 DIAGNOSIS — M109 Gout, unspecified: Secondary | ICD-10-CM | POA: Diagnosis present

## 2017-07-23 LAB — CBC
HCT: 31.2 % — ABNORMAL LOW (ref 40.0–52.0)
Hemoglobin: 10.4 g/dL — ABNORMAL LOW (ref 13.0–18.0)
MCH: 32.5 pg (ref 26.0–34.0)
MCHC: 33.3 g/dL (ref 32.0–36.0)
MCV: 97.7 fL (ref 80.0–100.0)
PLATELETS: 250 10*3/uL (ref 150–440)
RBC: 3.2 MIL/uL — AB (ref 4.40–5.90)
RDW: 15.3 % — ABNORMAL HIGH (ref 11.5–14.5)
WBC: 8.1 10*3/uL (ref 3.8–10.6)

## 2017-07-23 LAB — COMPREHENSIVE METABOLIC PANEL
ALK PHOS: 56 U/L (ref 38–126)
ALT: 79 U/L — AB (ref 17–63)
AST: 88 U/L — ABNORMAL HIGH (ref 15–41)
Albumin: 4.5 g/dL (ref 3.5–5.0)
Anion gap: 16 — ABNORMAL HIGH (ref 5–15)
BILIRUBIN TOTAL: 0.8 mg/dL (ref 0.3–1.2)
BUN: 70 mg/dL — ABNORMAL HIGH (ref 6–20)
CALCIUM: 9 mg/dL (ref 8.9–10.3)
CO2: 24 mmol/L (ref 22–32)
CREATININE: 12.64 mg/dL — AB (ref 0.61–1.24)
Chloride: 99 mmol/L — ABNORMAL LOW (ref 101–111)
GFR calc Af Amer: 4 mL/min — ABNORMAL LOW (ref >60)
GFR calc non Af Amer: 4 mL/min — ABNORMAL LOW (ref >60)
Glucose, Bld: 113 mg/dL — ABNORMAL HIGH (ref 65–99)
Potassium: >7.5 mmol/L (ref 3.5–5.1)
SODIUM: 139 mmol/L (ref 135–145)
Total Protein: 7.6 g/dL (ref 6.5–8.1)

## 2017-07-23 LAB — TYPE AND SCREEN
ABO/RH(D): O POS
Antibody Screen: NEGATIVE

## 2017-07-23 LAB — TROPONIN I
TROPONIN I: 0.04 ng/mL — AB (ref <0.03)
Troponin I: 0.04 ng/mL (ref <0.03)
Troponin I: 0.04 ng/mL (ref <0.03)

## 2017-07-23 LAB — TSH: TSH: 1.51 u[IU]/mL (ref 0.350–4.500)

## 2017-07-23 LAB — POTASSIUM
Potassium: 7.2 mmol/L (ref 3.5–5.1)
Potassium: 5 mmol/L (ref 3.5–5.1)
Potassium: 4 mmol/L (ref 3.5–5.1)
Potassium: 3.8 mmol/L (ref 3.5–5.1)

## 2017-07-23 LAB — LIPASE, BLOOD: Lipase: 63 U/L — ABNORMAL HIGH (ref 11–51)

## 2017-07-23 LAB — BASIC METABOLIC PANEL
Anion gap: 11 (ref 5–15)
BUN: 23 mg/dL — AB (ref 6–20)
CALCIUM: 9.2 mg/dL (ref 8.9–10.3)
CHLORIDE: 98 mmol/L — AB (ref 101–111)
CO2: 33 mmol/L — AB (ref 22–32)
CREATININE: 6.63 mg/dL — AB (ref 0.61–1.24)
GFR calc non Af Amer: 8 mL/min — ABNORMAL LOW (ref >60)
GFR, EST AFRICAN AMERICAN: 9 mL/min — AB (ref >60)
Glucose, Bld: 157 mg/dL — ABNORMAL HIGH (ref 65–99)
Potassium: 4.7 mmol/L (ref 3.5–5.1)
SODIUM: 142 mmol/L (ref 135–145)

## 2017-07-23 LAB — GLUCOSE, CAPILLARY
Glucose-Capillary: 68 mg/dL (ref 65–99)
Glucose-Capillary: 125 mg/dL — ABNORMAL HIGH (ref 65–99)
Glucose-Capillary: 132 mg/dL — ABNORMAL HIGH (ref 65–99)
Glucose-Capillary: 57 mg/dL — ABNORMAL LOW (ref 65–99)

## 2017-07-23 LAB — MRSA PCR SCREENING: MRSA BY PCR: NEGATIVE

## 2017-07-23 LAB — PROTIME-INR
INR: 1
PROTHROMBIN TIME: 13.1 s (ref 11.4–15.2)

## 2017-07-23 MED ORDER — ONDANSETRON HCL 4 MG/2ML IJ SOLN: 4.0000 mg | Freq: Four times a day (QID) | INTRAMUSCULAR | Status: DC | PRN

## 2017-07-23 MED ORDER — ACETAMINOPHEN 325 MG PO TABS: 650.0000 mg | ORAL_TABLET | Freq: Four times a day (QID) | ORAL | Status: DC | PRN

## 2017-07-23 MED ORDER — ONDANSETRON HCL 4 MG/2ML IJ SOLN
4.0000 mg | Freq: Once | INTRAMUSCULAR | Status: AC
  Administered 2017-07-23: 4 mg via INTRAVENOUS

## 2017-07-23 MED ORDER — CINACALCET HCL 30 MG PO TABS
30.0000 mg | ORAL_TABLET | Freq: Every day | ORAL | Status: DC
  Administered 2017-07-23 – 2017-07-24 (×2): 30 mg via ORAL
  Filled 2017-07-23 (×3): qty 1

## 2017-07-23 MED ORDER — DEXTROSE 50 % IV SOLN
1.0000 | Freq: Once | INTRAVENOUS | Status: AC
  Administered 2017-07-23: 50 mL via INTRAVENOUS

## 2017-07-23 MED ORDER — CALCIUM GLUCONATE 10 % IV SOLN
INTRAVENOUS | Status: AC
  Administered 2017-07-23: 06:00:00
  Filled 2017-07-23: qty 10

## 2017-07-23 MED ORDER — ONDANSETRON HCL 4 MG PO TABS: 4.0000 mg | ORAL_TABLET | Freq: Four times a day (QID) | ORAL | Status: DC | PRN

## 2017-07-23 MED ORDER — ACETAMINOPHEN 650 MG RE SUPP: 650.0000 mg | Freq: Four times a day (QID) | RECTAL | Status: DC | PRN

## 2017-07-23 MED ORDER — AMLODIPINE BESYLATE 10 MG PO TABS
10.0000 mg | ORAL_TABLET | Freq: Every day | ORAL | Status: DC
  Administered 2017-07-23 – 2017-07-24 (×2): 10 mg via ORAL
  Filled 2017-07-23 (×2): qty 1

## 2017-07-23 MED ORDER — SODIUM CHLORIDE 0.9 % IV SOLN
1.0000 g | Freq: Once | INTRAVENOUS | Status: AC
  Administered 2017-07-23: 1 g via INTRAVENOUS

## 2017-07-23 MED ORDER — DOCUSATE SODIUM 100 MG PO CAPS: 100.0000 mg | ORAL_CAPSULE | Freq: Two times a day (BID) | ORAL | Status: DC

## 2017-07-23 MED ORDER — RENA-VITE PO TABS
1.0000 | ORAL_TABLET | Freq: Every day | ORAL | Status: DC
  Administered 2017-07-23 – 2017-07-24 (×2): 1 via ORAL
  Filled 2017-07-23 (×3): qty 1

## 2017-07-23 MED ORDER — ONDANSETRON HCL 4 MG/2ML IJ SOLN
INTRAMUSCULAR | Status: AC
  Administered 2017-07-23: 4 mg via INTRAVENOUS
  Filled 2017-07-23: qty 2

## 2017-07-23 MED ORDER — ONDANSETRON HCL 4 MG/2ML IJ SOLN
INTRAMUSCULAR | Status: AC
  Filled 2017-07-23: qty 2

## 2017-07-23 MED ORDER — DEXTROSE 50 % IV SOLN
INTRAVENOUS | Status: AC
  Administered 2017-07-23: 50 mL via INTRAVENOUS
  Filled 2017-07-23: qty 50

## 2017-07-23 MED ORDER — DEXTROSE 50 % IV SOLN
12.5000 g | Freq: Once | INTRAVENOUS | Status: AC
  Administered 2017-07-23: 12.5 g via INTRAVENOUS

## 2017-07-23 MED ORDER — EPOETIN ALFA 10000 UNIT/ML IJ SOLN
4000.0000 [IU] | Freq: Once | INTRAMUSCULAR | Status: AC
  Administered 2017-07-23: 4000 [IU] via INTRAVENOUS
  Filled 2017-07-23: qty 1

## 2017-07-23 MED ORDER — INSULIN ASPART 100 UNIT/ML ~~LOC~~ SOLN: 5.0000 [IU] | Freq: Once | SUBCUTANEOUS | Status: DC

## 2017-07-23 MED ORDER — SODIUM BICARBONATE 8.4 % IV SOLN
INTRAVENOUS | Status: AC
  Filled 2017-07-23: qty 50

## 2017-07-23 MED ORDER — SODIUM CHLORIDE 0.9 % IV SOLN
1.0000 g | Freq: Once | INTRAVENOUS | Status: DC
  Filled 2017-07-23: qty 10

## 2017-07-23 MED ORDER — INSULIN ASPART 100 UNIT/ML ~~LOC~~ SOLN
SUBCUTANEOUS | Status: AC
  Filled 2017-07-23: qty 1

## 2017-07-23 MED ORDER — CARVEDILOL 3.125 MG PO TABS
6.2500 mg | ORAL_TABLET | Freq: Every day | ORAL | Status: DC
  Administered 2017-07-23 – 2017-07-24 (×2): 6.25 mg via ORAL
  Filled 2017-07-23: qty 1
  Filled 2017-07-23: qty 2

## 2017-07-23 MED ORDER — IOPAMIDOL (ISOVUE-300) INJECTION 61%
15.0000 mL | INTRAVENOUS | Status: DC
  Administered 2017-07-23: 15 mL via ORAL

## 2017-07-23 MED ORDER — DEXTROSE 50 % IV SOLN
INTRAVENOUS | Status: AC
  Filled 2017-07-23: qty 50

## 2017-07-23 MED ORDER — HEPARIN SODIUM (PORCINE) 5000 UNIT/ML IJ SOLN: 5000.0000 [IU] | Freq: Three times a day (TID) | INTRAMUSCULAR | Status: DC

## 2017-07-23 MED ORDER — SODIUM BICARBONATE 8.4 % IV SOLN
50.0000 meq | Freq: Once | INTRAVENOUS | Status: AC
  Administered 2017-07-23: 50 meq via INTRAVENOUS

## 2017-07-23 MED FILL — Medication: Qty: 1 | Status: AC

## 2017-07-23 NOTE — Progress Notes (Signed)
Patient completed HD. Doing well. On RA. Denies pain, CP, palpations, dyspnea and nausea. Tolerating fluids.   Blood pressure (!) 156/94, pulse 88, temperature 98.6 F (37 C), temperature source Oral, resp. rate 13, weight 82 kg (180 lb 12.4 oz), SpO2 92 %.  Will transfer out of the ICU  Christopher Hickman S. [facility] ANP-BC Pulmonary and Critical Care Medicine [facility] Pager [phone] or [phone]  NB: This document was prepared using Dragon voice recognition software and may include unintentional dictation errors.

## 2017-07-23 NOTE — H&P (Signed)
Christopher Hickman is an 62 y.o. male.   Chief Complaint: Abdominal pain HPI: The patient with past medical history of ESRD on dialysis, systolic CHF and hypertension presents to the emergency department complaining of abdominal pain.  Patient is also had loose stools that are nonbloody.  He denies fever, nausea or vomiting.  Laboratory evaluation revealed potassium greater than 7.  Telemetry also showed multiple pauses.  Nephrology was notified of agreed to emergent dialysis.  Once the patient was stable the emergency department call the hospitalist service for admission.  Past Medical History:  Diagnosis Date  . Anemia of chronic kidney failure   . Anxiety and depression   . Cardiomyopathy ([place])    a. prior EF 40%, now with normal EF as of 02/2016  . End stage renal disease ([place])   . Glaucoma   . Gout   . Hyperparathyroidism ([place])   . Hypertension   . Right renal mass     Past Surgical History:  Procedure Laterality Date  . CARDIAC CATHETERIZATION  07/2004   [facility]; ef60%  . left arm av graft    . NEPHRECTOMY    . SHOULDER SURGERY      Family History  Problem Relation Age of Onset  . Hypertension Mother    Social History:  reports that he has been smoking.  he has never used smokeless tobacco. He reports that he does not drink alcohol or use drugs.  Allergies:  Allergies  Allergen Reactions  . Codeine     swelling    Medications Prior to Admission  Medication Sig Dispense Refill  . amLODipine (NORVASC) 10 MG tablet Take 10 mg by mouth daily.     Marland Kitchen aspirin 81 MG tablet Take 81 mg by mouth 2 (two) times daily.     Marland Kitchen b complex-C-folic acid 1 MG capsule Take 1 capsule by mouth daily.    . calcium acetate (PHOSLO) 667 MG capsule Take 2,001 mg by mouth 3 (three) times daily with meals.    . carvedilol (COREG) 6.25 MG tablet Take 6.25 mg by mouth 2 (two) times daily.  5  . cinacalcet (SENSIPAR) 30 MG tablet Take 60 mg by mouth daily.     . hydrALAZINE (APRESOLINE) 25 MG  tablet Take 1 tablet (25 mg total) by mouth every 8 (eight) hours. 90 tablet 0  . hydrALAZINE (APRESOLINE) 50 MG tablet Take 1 tablet (50 mg total) by mouth every 8 (eight) hours. 60 tablet 0  . metoprolol (LOPRESSOR) 100 MG tablet Take 1 tablet (100 mg total) by mouth every 12 (twelve) hours. 60 tablet 0  . valsartan (DIOVAN) 160 MG tablet Take 1 tablet by mouth 2 (two) times daily.      Results for orders placed or performed during the hospital encounter of 07/23/17 (from the past 48 hour(s))  Lipase, blood     Status: Abnormal   Collection Time: 07/23/17  4:00 AM  Result Value Ref Range   Lipase 63 (H) 11 - 51 U/L    Comment: Performed at [facility], [address] [place], [place] [address]  Comprehensive metabolic panel     Status: Abnormal   Collection Time: 07/23/17  4:00 AM  Result Value Ref Range   Sodium 139 135 - 145 mmol/L   Potassium >7.5 (HH) 3.5 - 5.1 mmol/L    Comment: CRITICAL RESULT CALLED TO, READ BACK BY AND VERIFIED WITH LORIE LEMONS ON 07/23/17 AT 0517 JAG RESULTS CONFIRMED BY MANUAL DILUTION CORRECTED ON 01/15  AT 0543: PREVIOUSLY REPORTED AS >7.5 CRITICAL RESULT CALLED TO, READ BACK BY AND VERIFIED WITH LORIE LEMONS ON 07/23/17 AT 0517 JAG    Chloride 99 (L) 101 - 111 mmol/L   CO2 24 22 - 32 mmol/L   Glucose, Bld 113 (H) 65 - 99 mg/dL   BUN 70 (H) 6 - 20 mg/dL   Creatinine, Ser 12.64 (H) 0.61 - 1.24 mg/dL   Calcium 9.0 8.9 - 10.3 mg/dL   Total Protein 7.6 6.5 - 8.1 g/dL   Albumin 4.5 3.5 - 5.0 g/dL   AST 88 (H) 15 - 41 U/L   ALT 79 (H) 17 - 63 U/L   Alkaline Phosphatase 56 38 - 126 U/L   Total Bilirubin 0.8 0.3 - 1.2 mg/dL   GFR calc non Af Amer 4 (L) >60 mL/min   GFR calc Af Amer 4 (L) >60 mL/min    Comment: (NOTE) The eGFR has been calculated using the CKD EPI equation. This calculation has not been validated in all clinical situations. eGFR's persistently <60 mL/min signify possible Chronic Kidney Disease.    Anion gap 16 (H) 5 - 15     Comment: Performed at [facility], [address] [place], [place] [address]  CBC     Status: Abnormal   Collection Time: 07/23/17  4:00 AM  Result Value Ref Range   WBC 8.1 3.8 - 10.6 K/uL   RBC 3.20 (L) 4.40 - 5.90 MIL/uL   Hemoglobin 10.4 (L) 13.0 - 18.0 g/dL   HCT 31.2 (L) 40.0 - 52.0 %   MCV 97.7 80.0 - 100.0 fL   MCH 32.5 26.0 - 34.0 pg   MCHC 33.3 32.0 - 36.0 g/dL   RDW 15.3 (H) 11.5 - 14.5 %   Platelets 250 150 - 440 K/uL    Comment: Performed at [facility], [address] [place], [place] [address]  Troponin I     Status: Abnormal   Collection Time: 07/23/17  4:00 AM  Result Value Ref Range   Troponin I 0.04 (HH) <0.03 ng/mL    Comment: CRITICAL RESULT CALLED TO, READ BACK BY AND VERIFIED WITH LORIE LEMONS ON 07/23/17 AT [place] Performed at [facility], [address] [place], [place] [address]   Type and screen [place]     Status: None   Collection Time: 07/23/17  4:53 AM  Result Value Ref Range   ABO/RH(D) O POS    Antibody Screen NEG    Sample Expiration      07/26/2017 Performed at [facility], [address] [place], [place] [address]   Protime-INR     Status: None   Collection Time: 07/23/17  4:57 AM  Result Value Ref Range   Prothrombin Time 13.1 11.4 - 15.2 seconds   INR 1.00     Comment: Performed at [facility], [address] [place], [place] [address]  Glucose, capillary     Status: None   Collection Time: 07/23/17  6:11 AM  Result Value Ref Range   Glucose-Capillary 68 65 - 99 mg/dL  Glucose, capillary     Status: Abnormal   Collection Time: 07/23/17  6:55 AM  Result Value Ref Range   Glucose-Capillary 57 (L) 65 - 99 mg/dL  Glucose, capillary     Status: Abnormal   Collection Time: 07/23/17  7:25 AM  Result Value Ref Range   Glucose-Capillary 132 (H) 65 - 99 mg/dL  Glucose, capillary  Status: Abnormal   Collection Time: 07/23/17  7:28 AM  Result Value Ref  Range   Glucose-Capillary 125 (H) 65 - 99 mg/dL   No results found.  Review of Systems  Constitutional: Negative for chills and fever.  HENT: Negative for sore throat and tinnitus.   Eyes: Negative for blurred vision and redness.  Respiratory: Negative for cough and shortness of breath.   Cardiovascular: Negative for chest pain, palpitations, orthopnea and PND.  Gastrointestinal: Positive for abdominal pain and diarrhea. Negative for nausea and vomiting.  Genitourinary: Negative for dysuria, frequency and urgency.  Musculoskeletal: Negative for joint pain and myalgias.  Skin: Negative for rash.       No lesions  Neurological: Negative for speech change, focal weakness and weakness.  Endo/Heme/Allergies: Does not bruise/bleed easily.       No temperature intolerance  Psychiatric/Behavioral: Negative for depression and suicidal ideas.    Blood pressure (!) 190/116, pulse (!) 117, temperature 98.9 F (37.2 C), temperature source Oral, resp. rate 19, weight 82.1 kg (181 lb), SpO2 100 %. Physical Exam  Vitals reviewed. Constitutional: He is oriented to person, place, and time. He appears well-developed and well-nourished. No distress.  HENT:  Head: Normocephalic and atraumatic.  Mouth/Throat: Oropharynx is clear and moist.  Eyes: Conjunctivae and EOM are normal. Pupils are equal, round, and reactive to light. No scleral icterus.  Neck: Normal range of motion. Neck supple. No JVD present. No tracheal deviation present. No thyromegaly present.  Cardiovascular: Normal rate, regular rhythm and normal heart sounds. Exam reveals no gallop and no friction rub.  No murmur heard. Respiratory: Effort normal and breath sounds normal. No respiratory distress.  GI: Soft. Bowel sounds are normal. He exhibits no distension. There is no tenderness.  Genitourinary:  Genitourinary Comments: Deferred  Lymphadenopathy:    He has no cervical adenopathy.  Neurological: He is alert and oriented to  person, place, and time. No cranial nerve deficit.  Skin: Skin is warm and dry. No rash noted. No erythema.  Psychiatric: He has a normal mood and affect. His behavior is normal. Judgment and thought content normal.     Assessment/Plan This is a 62 year old male admitted for hyperkalemia. 1.  Hypokalemia: With associated arrhythmia; needs urgent dialysis.  Given calcium in the emergency department. 2.  ESRD: Consult nephrology for emergent dialysis. 3.  Hypertension: Uncontrolled; resume antihypertensive medication once the patient is completed dialysis 4.  Elevated troponin: Secondary to demand ischemia although notably on EKG septal infarct cannot be ruled out.  To need to monitor telemetry.  Check cardiac biomarkers following dialysis. consult cardiology if troponin increases or arrhythmias persist. 5.  DVT prophylaxis: Heparin 6.  GI prophylaxis: None The patient is a full code.  Time spent on admission orders and critical care approximately 45 minutes.  Discussed with ICU staff (shift change).  Harrie Foreman, MD 07/23/2017, 7:42 AM

## 2017-07-23 NOTE — ED Triage Notes (Signed)
Patient c/o lower abdominal pain, nausea, and diarrhea. Patient denies emesis.   Patient is dialysis patient. Patient receives dialysis on T/Th/S.

## 2017-07-23 NOTE — Progress Notes (Signed)
HD Started 

## 2017-07-23 NOTE — Progress Notes (Signed)
CRITICAL VALUE ALERT  Critical Value:  Potassium 7.3  Date & Time Notied:  07/23/17 11:30 Provider Notified: MD Simonds  Orders Received/Actions taken: pt is currently having dialysis to decrease K+

## 2017-07-23 NOTE — Progress Notes (Signed)
Pt blood sugar reassessed and now is 132.

## 2017-07-23 NOTE — ED Notes (Signed)
Zole at bedside, pads placed on pt

## 2017-07-23 NOTE — Progress Notes (Signed)
Hemodialysis: After 1 hour on HD stat K resulted 5.0. Dr. Candiss Norse notified. Order to change to 2K bath for rest of treatment. Done

## 2017-07-23 NOTE — Progress Notes (Signed)
SCDs leg sleeves are large and currently out of stock of appropriate size for the patient. SCDs are off until size is available.

## 2017-07-23 NOTE — Progress Notes (Addendum)
Hemodialysis-per lab 1115 potassium result is 4.2. Continuing with 2k bath on HD.

## 2017-07-23 NOTE — Progress Notes (Signed)
HD completed without issue. UF 2L. Potassium within range. Tolerated well. Report given to primary RN.

## 2017-07-23 NOTE — Progress Notes (Signed)
Pt. Still attached to zole at beginning of shift. Pt unhooked from zole after no reports of ectopy or arrhythmias during prior shift from off-going RNs. No complaints of pain or CP. Pt needs anticipated. Floor tranfer orders placed by NP. Will continue to monitor.

## 2017-07-23 NOTE — Progress Notes (Signed)
Patient admitted from ED to ICU 1 arrived with zole pads on alert and oriented x4 vitals stable. O2 @ 2l via [place]. Per transporting nurse patient brady to 40s during transport. Patient made comfortable in bed questions answered via interpretor. CBG 57 d50 administered.

## 2017-07-23 NOTE — ED Notes (Addendum)
Attempted to call report. RN notified pt nurse will be amber. Amber currently giving med and will call ED RN back

## 2017-07-23 NOTE — Consult Note (Signed)
Name: [facility] MRN: [id number] DOB: 1955-10-10    ADMISSION DATE:  07/23/2017 CONSULTATION DATE: 07/23/2017  REFERRING MD : Dr. Marcille Blanco   CHIEF COMPLAINT: Abdominal Pain and Nausea   BRIEF PATIENT DESCRIPTION:  62 yo male admitted with cardiac arrhythmias secondary to hyperkalemia requiring emergent hemodialysis and GI bleed  SIGNIFICANT EVENTS  01/15-Pt admitted to stepdown unit   STUDIES:  CT Abd Pelvis 01/15>>  HISTORY OF PRESENT ILLNESS:   This is a 62 yo male with a PMH of Right Renal Mass, HTN, Hyperparathyroidism, Gout, ESRD on HD, Cardiomyopathy, Anxiety, Depression, and Anemia of Chronic Kidney Disease.  [facility]15 generalized abdominal discomfort and diarrhea onset of symptoms 3 days prior to presentation to the ER.  Per ER notes the pt has been taking Pepto-Bismol without relief of symptoms and he has had black tarry tools without bright red blood.  In the ER lab results revealed pt guaiac positive, K+ >7.5, BUN 70, creatinine 12.64, and hgb 10.4.  He also developed cardiac arrhythmias (atrial fibrillation, multiple pauses, and bradycardia) and was treated medically for hyperkalemia.  He was subsequently admitted to the stepdown unit by hospitalist team for emergent HD and further workup.  PAST MEDICAL HISTORY :   has a past medical history of Anemia of chronic kidney failure, Anxiety and depression, Cardiomyopathy ([place]), End stage renal disease ([place]), Glaucoma, Gout, Hyperparathyroidism ([place]), Hypertension, and Right renal mass.  has a past surgical history that includes Shoulder surgery; left arm av graft; Cardiac catheterization (07/2004); and Nephrectomy. Prior to Admission medications   Medication Sig Start Date End Date Taking? Authorizing Provider  amLODipine (NORVASC) 10 MG tablet Take 10 mg by mouth daily.     [provider]  aspirin 81 MG tablet Take 81 mg by mouth 2 (two) times daily.     [provider]  b  complex-C-folic acid 1 MG capsule Take 1 capsule by mouth daily.    [provider]  calcium acetate (PHOSLO) 667 MG capsule Take 2,001 mg by mouth 3 (three) times daily with meals. 05/24/16   [provider]  carvedilol (COREG) 6.25 MG tablet Take 6.25 mg by mouth 2 (two) times daily. 07/01/17   [provider]  cinacalcet (SENSIPAR) 30 MG tablet Take 60 mg by mouth daily.     [provider]  hydrALAZINE (APRESOLINE) 25 MG tablet Take 1 tablet (25 mg total) by mouth every 8 (eight) hours. 08/29/16   Loletha Grayer, MD  hydrALAZINE (APRESOLINE) 50 MG tablet Take 1 tablet (50 mg total) by mouth every 8 (eight) hours. 08/29/16   Dustin Flock, MD  metoprolol (LOPRESSOR) 100 MG tablet Take 1 tablet (100 mg total) by mouth every 12 (twelve) hours. 08/29/16   Loletha Grayer, MD  valsartan (DIOVAN) 160 MG tablet Take 1 tablet by mouth 2 (two) times daily. 08/07/16   [provider]   Allergies  Allergen Reactions  . Codeine     swelling    FAMILY HISTORY:  family history includes Hypertension in his mother. SOCIAL HISTORY:  reports that he has been smoking.  he has never used smokeless tobacco. He reports that he does not drink alcohol or use drugs.  REVIEW OF SYSTEMS: Positives in BOLD  Constitutional: Negative for fever, chills, weight loss, malaise/fatigue and diaphoresis.  HENT: Negative for hearing loss, ear pain, nosebleeds, congestion, sore throat, neck pain, tinnitus and ear discharge.   Eyes: Negative for blurred vision, double vision, photophobia, pain, discharge  and redness.  Respiratory: Negative for cough, hemoptysis, sputum production, shortness of breath, wheezing and stridor.   Cardiovascular: Negative for chest pain, palpitations, orthopnea, claudication, leg swelling and PND.  Gastrointestinal: heartburn, nausea, vomiting, abdominal pain, diarrhea, constipation, blood in stool and melena.  Genitourinary: Negative for dysuria,  urgency, frequency, hematuria and flank pain.  Musculoskeletal: Negative for myalgias, back pain, joint pain and falls.  Skin: Negative for itching and rash.  Neurological: Negative for dizziness, tingling, tremors, sensory change, speech change, focal weakness, seizures, loss of consciousness, weakness and headaches.  Endo/Heme/Allergies: Negative for environmental allergies and polydipsia. Does not bruise/bleed easily.  SUBJECTIVE:  No complaints at this time  VITAL SIGNS: Temp:  [98.9 F (37.2 C)] 98.9 F (37.2 C) (01/15 0353) Pulse Rate:  [126] 126 (01/15 0353) Resp:  [20] 20 (01/15 0353) BP: (180)/(112) 180/112 (01/15 0353) SpO2:  [100 %] 100 % (01/15 0353) Weight:  [82.1 kg (181 lb)] 82.1 kg (181 lb) (01/15 0353)  PHYSICAL EXAMINATION: General: well developed, well nourished male, NAD  Neuro: alert and oriented, follows commands HEENT: supple, no JVD  Cardiovascular: irregular irregular, no M/R/G Lungs: clear throughout, even, non labored  Abdomen: +BS x4, soft, non tender, non distended  Musculoskeletal: normal bulk and tone, no edema  Skin: intact no rashes or lesions   Recent Labs  Lab 07/23/17 0400  NA 139  K >7.5*  CL 99*  CO2 24  BUN 70*  CREATININE 12.64*  GLUCOSE 113*   Recent Labs  Lab 07/23/17 0400  HGB 10.4*  HCT 31.2*  WBC 8.1  PLT 250   No results found.  ASSESSMENT / PLAN: Cardiac Arrhythmias (multiple pauses) secondary to Hyperkalemia  ESRD on HD  Mildly elevated troponin likely secondary to demand ischemia Transaminitis  Anemia of chronic kidney disease Gastrointestinal Bleeding-guaiac positive  Abdominal Pain Nausea and Vomiting   Hx: HTN and Cardiomyopathy P: Supplemental O2 for dyspnea and/or hypoxia  Continuous telemetry monitoring  Trend troponin's  Nephrology consulted appreciate input-plans for emergent HD  Trend CMP Replace electrolytes as indicated  Avoid nephrotoxic medications  SCD's for VTE prophylaxis  Trend  CBC Monitor for s/sx of bleeding Transfuse per usual guidelines  Prn zofran for nausea/vomiting   Marda Stalker, [place] Pager [phone] (please enter 7 digits) PCCM Consult Pager [phone] (please enter 7 digits)

## 2017-07-23 NOTE — ED Notes (Addendum)
Pt given calcium chloride 1g iv push at 0608 per verbal order by dr brown

## 2017-07-23 NOTE — ED Notes (Signed)
Date and time results received: 07/23/17    Test: troponin, Potas[place]  Critical Value: 0.04/ greater than 7.5  Name of Provider Notified: Owens Shark  Orders Received? Or Actions Taken?:  MD notified

## 2017-07-23 NOTE — Progress Notes (Signed)
Pre HD  

## 2017-07-23 NOTE — Plan of Care (Signed)
Nutrition Education Note  RD consulted for education on low potassium diet.  62 year old male with PMHx of HTN, gout, ESRD on HD TTS, glaucoma, cardiomyopathy, hyperparathyroidism who presented with abdominal pain and diarrhea found to have severe hyperkalemia with arrhythmias requiring urgent HD.  Met with patient at bedside and communicated with Spanish interpreter Environmental education officer). Patient reports he started HD in 2012. He has learned about the renal diet previously at HD center. He reports that over the past 2-3 days PTA he bought 2 bottles of orange juice as they were buy one get one free. He drank 1.5 of those bottles of orange juice. He also reports to eating other foods high in potassium such as oranges and bananas (buy 3 of each at a time), milk, and salt substitute (potassium chloride). Strongly discouraged intake of these high-potassium foods and discussed lower-potassium options. Patient initially resistant to reducing intake of these foods saying he needs variety in his diet. Discussed outcome of eating high potassium foods (hospitalization) and he reports he has also been hospitalized about one year ago with high potassium. By the end of the conversation he reports he is motivated to choose foods that are best for him. He has a good appetite. Eats 2 meals per day (meat, fish, eggs, biscuits, coke daily, milk). Also discussed how dark sodas are high in phosphorus.  Provided Healthy Eating for Hemodialysis handout in Spanish from [facility] to patient. Reviewed food groups and provided written recommended serving sizes specifically determined for patient's current nutritional status.  Explained why diet restrictions are needed and provided lists of foods to limit/avoid that are high in potassium. Also reviewed foods high in phosphorus per patient's dietary recall. Provided specific recommendations on safer alternatives of these foods. Strongly encouraged compliance of this  diet.  Encouraged pt to discuss specific diet questions/concerns with RD at HD outpatient facility. Teach back method used.  Expect fair compliance.  Body mass index is 25.36 kg/m. Pt meets criteria for normal weight based on current BMI.  Current diet order is Renal. Meal completion not recorded at this time. Labs and medications reviewed. No further nutrition interventions warranted at this time. RD contact information provided. If additional nutrition issues arise, please re-consult RD.  Willey Blade, MS, [place], LDN Office: [phone] Pager: [phone] After Hours/Weekend Pager: [phone]

## 2017-07-23 NOTE — ED Notes (Signed)
Interpreter on a stick used to triage/assess patient

## 2017-07-23 NOTE — Progress Notes (Signed)
[facility], [place] 07/23/17  Subjective:   Patient known to our practice from outpatient dialysis He presented last evening for complaints of abdominal pain, loose stools.  He denies any frank blood in the urine but guaiac test in the emergency room was positive. His outpatient labs also showed that hemoglobin is trending down In addition, his potassium level was found to be critically elevated at greater than 7.5 with arrhythmias Urgent hemodialysis is requested  At present, patient denies any acute shortness of breath.  He reports that he bought orange juice on sale and had almost 2 bottles of oranges in the last 2-3 days.  At present, his abdominal pain is much better.  He does report he ate some spicy food yesterday afternoon  Objective:  Vital signs in last 24 hours:  Temp:  [98 F (36.7 C)-98.9 F (37.2 C)] 98 F (36.7 C) (01/15 0800) Pulse Rate:  [46-126] 83 (01/15 0800) Resp:  [8-21] 13 (01/15 0800) BP: (160-193)/(87-125) 161/87 (01/15 0800) SpO2:  [91 %-100 %] 99 % (01/15 0800) Weight:  [82.1 kg (181 lb)] 82.1 kg (181 lb) (01/15 0353)  Weight change:  Filed Weights   07/23/17 0353  Weight: 82.1 kg (181 lb)    Intake/Output:   No intake or output data in the 24 hours ending 07/23/17 0837   Physical Exam: General:  No acute distress, laying in the  HEENT  anicteric, moist mucous membranes  Neck  supple  Pulm/lungs  normal breathing effort, clear to auscultation  CVS/Heart  regular rhythm, no rub or gallop  Abdomen:   Soft, mild midepigastric tenderness  Extremities:  No edema  Neurologic:  Alert, oriented  Skin:  No acute rashes  Access:  Left arm AV fistula aneurysmal       Basic Metabolic Panel:  Recent Labs  Lab 07/23/17 0400 07/23/17 0729  NA 139  --   K >7.5* 7.2*  CL 99*  --   CO2 24  --   GLUCOSE 113*  --   BUN 70*  --   CREATININE 12.64*  --   CALCIUM 9.0  --      CBC: Recent Labs  Lab 07/23/17 0400   WBC 8.1  HGB 10.4*  HCT 31.2*  MCV 97.7  PLT 250      Lab Results  Component Value Date   HEPBSAG Negative 08/28/2016      Microbiology:  No results found for this or any previous visit (from the past 240 hour(s)).  Coagulation Studies: Recent Labs    07/23/17 0457  LABPROT 13.1  INR 1.00    Urinalysis: No results for input(s): COLORURINE, LABSPEC, PHURINE, GLUCOSEU, HGBUR, BILIRUBINUR, KETONESUR, PROTEINUR, UROBILINOGEN, NITRITE, LEUKOCYTESUR in the last 72 hours.  Invalid input(s): APPERANCEUR    Imaging: No results found.   Medications:   . calcium chloride  IV     . docusate sodium  100 mg Oral BID  . insulin aspart  5 Units Intravenous Once   acetaminophen **OR** [DISCONTINUED] acetaminophen, ondansetron **OR** ondansetron (ZOFRAN) IV  Assessment/ Plan:  62 y.o. male with end-stage renal disease, cardiomyopathy, anxiety, gout, hypertension, right renal mass, history of nephrectomy, presents for abdominal pain and diarrhea and is found to have severe hypokalemia  [place]/ [facility]/ TTS/Left arm AVF/ 81 kg 1.  End-stage renal disease 2.  Severe hyperkalemia with arrhythmias 3.  Secondary hyperparathyroidism 4.  Anemia of chronic kidney disease  Patient was given stabilizing emergent medical treatment for hyperkalemia Urgent hemodialysis  this morning to correct potassium patient was counseled not drink orange juice.  We will get dietitian education also May resume home dose of binders and cinacalcet once he is able to eat a normal diet Resume EPO with HD    LOS: 0 Kristopher Attwood Candiss Norse 1/15/20198:37 AM  [place], [place]

## 2017-07-23 NOTE — Progress Notes (Signed)
Hemodialysis- Second potassium level on HD resulted high. Order to draw/resend another stat potassium level and also order potassium level for 2 hours after HD. Done.

## 2017-07-23 NOTE — Progress Notes (Signed)
Post HD assessment unchanged  

## 2017-07-23 NOTE — ED Notes (Signed)
Pt had episode of asystole,as well as other cardiac changes, MD, charge nurse notified of changes.

## 2017-07-23 NOTE — ED Provider Notes (Addendum)
[facility] Emergency Department Provider Note    First MD Initiated Contact with Patient 07/23/17 [phone]     (approximate)  I have reviewed the triage vital signs and the nursing notes.   HISTORY  Chief Complaint Abdominal Pain and Diarrhea   HPI Christopher Hickman is a 62 y.o. male with below list of chronic medical conditions including end-stage renal disease needing dialysis Tuesday Thursday and Saturday  Presents to the emergency department generalized abdominal discomfort with current pain score 4 out of 10 accompanied by diarrhea times 3 days.  Patient states that he has taken Pepto-Bismol for his diarrhea without resolution.  Patient admits that stool has been black however no bright red blood noted.  Patient denies any nausea or vomiting.  Patient denies any fever.  Patient denies any recent antibiotic use  Past Medical History:  Diagnosis Date  . Anemia of chronic kidney failure   . Anxiety and depression   . Cardiomyopathy ([place])    a. prior EF 40%, now with normal EF as of 02/2016  . End stage renal disease ([place])   . Glaucoma   . Gout   . Hyperparathyroidism ([place])   . Hypertension   . Right renal mass     Patient Active Problem List   Diagnosis Date Noted  . Chest pain 08/28/2016  . Hypertensive urgency 08/28/2016  . Dietary indiscretion 08/28/2016  . ESRD (end stage renal disease) on dialysis ([place]) 08/28/2016  . NICM (nonischemic cardiomyopathy) ([place]) 08/28/2016    Past Surgical History:  Procedure Laterality Date  . CARDIAC CATHETERIZATION  07/2004   [facility]; ef60%  . left arm av graft    . NEPHRECTOMY    . SHOULDER SURGERY      Prior to Admission medications   Medication Sig Start Date End Date Taking? Authorizing Provider  amLODipine (NORVASC) 10 MG tablet Take 10 mg by mouth daily.     [provider]  aspirin 81 MG tablet Take 81 mg by mouth 2 (two) times daily.     [provider]  b complex-C-folic acid 1  MG capsule Take 1 capsule by mouth daily.    [provider]  calcium acetate (PHOSLO) 667 MG capsule Take 2,001 mg by mouth 3 (three) times daily with meals. 05/24/16   [provider]  cinacalcet (SENSIPAR) 30 MG tablet Take 60 mg by mouth daily.     [provider]  hydrALAZINE (APRESOLINE) 25 MG tablet Take 1 tablet (25 mg total) by mouth every 8 (eight) hours. 08/29/16   Loletha Grayer, MD  hydrALAZINE (APRESOLINE) 50 MG tablet Take 1 tablet (50 mg total) by mouth every 8 (eight) hours. 08/29/16   Dustin Flock, MD  metoprolol (LOPRESSOR) 100 MG tablet Take 1 tablet (100 mg total) by mouth every 12 (twelve) hours. 08/29/16   Loletha Grayer, MD  valsartan (DIOVAN) 160 MG tablet Take 1 tablet by mouth 2 (two) times daily. 08/07/16   [provider]    Allergies Codeine  Family History  Problem Relation Age of Onset  . Hypertension Mother     Social History Social History   Tobacco Use  . Smoking status: Current Some Day Smoker  . Smokeless tobacco: Never Used  Substance Use Topics  . Alcohol use: No  . Drug use: No    Review of Systems Constitutional: No fever/chills Eyes: No visual changes. ENT: No sore throat. Cardiovascular: Denies chest pain. Respiratory: Denies shortness of breath. Gastrointestinal: Positive for abdominal  pain and diarrhea (dark stools)  genitourinary: Negative for dysuria. Musculoskeletal: Negative for neck pain.  Negative for back pain. Integumentary: Negative for rash. Neurological: Negative for headaches, focal weakness or numbness.  ____________________________________________   PHYSICAL EXAM:  VITAL SIGNS: ED Triage Vitals [07/23/17 0353]  Enc Vitals Group     BP (!) 180/112     Pulse Rate (!) 126     Resp 20     Temp 98.9 F (37.2 C)     Temp Source Oral     SpO2 100 %     Weight 82.1 kg (181 lb)     Height      Head Circumference      Peak Flow      Pain Score      Pain Loc      Pain  Edu?      Excl. in [place]?     Constitutional: Alert and oriented. Well appearing and in no acute distress. Eyes: Conjunctivae are pale.  Scleral icterus Head: Atraumatic. Mouth/Throat: Mucous membranes are moist.  Oropharynx non-erythematous. Neck: No stridor.   Cardiovascular: Tachycardia regular rhythm. Good peripheral circulation. Grossly normal heart sounds. Respiratory: Normal respiratory effort.  No retractions. Lungs CTAB. Gastrointestinal: Soft and nontender. No distention. :  Guaiac positive stools Musculoskeletal: No lower extremity tenderness nor edema. No gross deformities of extremities. Neurologic:  Normal speech and language. No gross focal neurologic deficits are appreciated.  Skin:  Skin is warm, dry and intact. No rash noted.  Jaundice Psychiatric: Mood and affect are normal. Speech and behavior are normal.  ____________________________________________   LABS (all labs ordered are listed, but only abnormal results are displayed)  Labs Reviewed  LIPASE, BLOOD - Abnormal; Notable for the following components:      Result Value   Lipase 63 (*)    All other components within normal limits  COMPREHENSIVE METABOLIC PANEL - Abnormal; Notable for the following components:   Potassium >7.5 (*)    Chloride 99 (*)    Glucose, Bld 113 (*)    BUN 70 (*)    Creatinine, Ser 12.64 (*)    AST 88 (*)    ALT 79 (*)    GFR calc non Af Amer 4 (*)    GFR calc Af Amer 4 (*)    Anion gap 16 (*)    All other components within normal limits  CBC - Abnormal; Notable for the following components:   RBC 3.20 (*)    Hemoglobin 10.4 (*)    HCT 31.2 (*)    RDW 15.3 (*)    All other components within normal limits  TROPONIN I - Abnormal; Notable for the following components:   Troponin I 0.04 (*)    All other components within normal limits  PROTIME-INR  URINALYSIS, COMPLETE (UACMP) WITH MICROSCOPIC  TYPE AND SCREEN   ____________________________________________  EKG  ED ECG  REPORT I, [place] Matthew Cina, the attending physician, personally viewed and interpreted this ECG.   Date: 07/23/2017  EKG Time: 3:57 AM  Rate: 119  Rhythm: Sinus tachycardia  Axis: left Axis deviation  Intervals: Normal  ST&T Change: None  ____________________________________________     PROCEDURES  Critical Care performed: CRITICAL CARE Performed by: Gregor Hams   Total critical care time: 60 minutes  Critical care time was exclusive of separately billable procedures and treating other patients.  Critical care was necessary to treat or prevent imminent or life-threatening deterioration.  Critical care was time spent personally by me  on the following activities: development of treatment plan with patient and/or surrogate as well as nursing, discussions with consultants, evaluation of patient's response to treatment, examination of patient, obtaining history from patient or surrogate, ordering and performing treatments and interventions, ordering and review of laboratory studies, ordering and review of radiographic studies, pulse oximetry and re-evaluation of patient's condition.  Procedures   ____________________________________________   INITIAL IMPRESSION / ASSESSMENT AND PLAN / ED COURSE  As part of my medical decision making, I reviewed the following data within the electronic MEDICAL RECORD NUMBER10 year old male presented with above-stated history and physical exam concerning for gastrointestinal bleeding given guaiac positive state pale conjunctiva jaundice.  Patient's initial EKG revealed sinus tachycardia with premature ventricular contractions.  Patient's potassium noted to be greater than 7.5 and as such patient was given insulin 5 units half an amp of D50 1 amp of sodium bicarb and of course calcium gluconate 1 amp.  Patient's cardiac rhythm changed abruptly before administration of calcium with frequent prolonged cardiac pauses with bradycardia.  Following calcium  administration patient's rhythm returned to sinus tachycardia.  Patient discussed with Dr. Candiss Norse for emergent dialysis.  In addition patient discussed with Dr. Marcille Blanco for hospital admission for further evaluation and management of gastrointestinal bleeding and hyperkalemia ____________________________________________  FINAL CLINICAL IMPRESSION(S) / ED DIAGNOSES  Final diagnoses:  Gastrointestinal hemorrhage, unspecified gastrointestinal hemorrhage type  Hyperkalemia     MEDICATIONS GIVEN DURING THIS VISIT:  Medications  iopamidol (ISOVUE-300) 61 % injection 15 mL (15 mLs Oral Contrast Given 07/23/17 0525)  insulin aspart (novoLOG) 100 UNIT/ML injection (not administered)  insulin aspart (novoLOG) injection 5 Units (not administered)  calcium gluconate 1 g in sodium chloride 0.9 % 100 mL IVPB (1 g Intravenous New Bag/Given 07/23/17 0543)  ondansetron (ZOFRAN) injection 4 mg (4 mg Intravenous Given 07/23/17 0413)  sodium bicarbonate injection 50 mEq (50 mEq Intravenous Given 07/23/17 0536)  dextrose 50 % solution 12.5 g (12.5 g Intravenous Given 07/23/17 0540)  calcium gluconate 10 % injection (  Given 07/23/17 0546)     ED Discharge Orders    None       Note:  This document was prepared using Dragon voice recognition software and may include unintentional dictation errors.    Gregor Hams, MD 07/23/17 6160    Gregor Hams, MD 07/23/17 [phone]

## 2017-07-23 NOTE — Progress Notes (Signed)
Pre HD assessment. Patient lying in bed in no distress

## 2017-07-23 NOTE — Progress Notes (Signed)
[place] at [place]: [place]    MR#:  [id number]  DATE OF BIRTH:  1956/02/18  SUBJECTIVE:  CHIEF COMPLAINT:   Chief Complaint  Patient presents with  . Abdominal Pain  . Diarrhea  No events overnight per nursing staff, for hemodialysis, possible floor later today in discussion with critical care attending  REVIEW OF SYSTEMS:  CONSTITUTIONAL: No fever, fatigue or weakness.  EYES: No blurred or double vision.  EARS, NOSE, AND THROAT: No tinnitus or ear pain.  RESPIRATORY: No cough, shortness of breath, wheezing or hemoptysis.  CARDIOVASCULAR: No chest pain, orthopnea, edema.  GASTROINTESTINAL: No nausea, vomiting, diarrhea or abdominal pain.  GENITOURINARY: No dysuria, hematuria.  ENDOCRINE: No polyuria, nocturia,  HEMATOLOGY: No anemia, easy bruising or bleeding SKIN: No rash or lesion. MUSCULOSKELETAL: No joint pain or arthritis.   NEUROLOGIC: No tingling, numbness, weakness.  PSYCHIATRY: No anxiety or depression.   ROS  DRUG ALLERGIES:   Allergies  Allergen Reactions  . Codeine     swelling    VITALS:  Blood pressure (!) 152/102, pulse 84, temperature 98.2 F (36.8 C), temperature source Oral, resp. rate 11, weight 82 kg (180 lb 12.4 oz), SpO2 97 %.  PHYSICAL EXAMINATION:  GENERAL:  62 y.o.-year-old patient lying in the bed with no acute distress.  EYES: Pupils equal, round, reactive to light and accommodation. No scleral icterus. Extraocular muscles intact.  HEENT: Head atraumatic, normocephalic. Oropharynx and nasopharynx clear.  NECK:  Supple, no jugular venous distention. No thyroid enlargement, no tenderness.  LUNGS: Normal breath sounds bilaterally, no wheezing, rales,rhonchi or crepitation. No use of accessory muscles of respiration.  CARDIOVASCULAR: S1, S2 normal. No murmurs, rubs, or gallops.  ABDOMEN: Soft, nontender, nondistended. Bowel sounds present. No organomegaly or mass.  EXTREMITIES: No  pedal edema, cyanosis, or clubbing.  NEUROLOGIC: Cranial nerves II through XII are intact. Muscle strength 5/5 in all extremities. Sensation intact. Gait not checked.  PSYCHIATRIC: The patient is alert and oriented x 3.  SKIN: No obvious rash, lesion, or ulcer.   Physical Exam LABORATORY PANEL:   CBC Recent Labs  Lab 07/23/17 0400  WBC 8.1  HGB 10.4*  HCT 31.2*  PLT 250   ------------------------------------------------------------------------------------------------------------------  Chemistries  Recent Labs  Lab 07/23/17 0400  07/23/17 1556  NA 139  --  142  K >7.5*   < > 4.7  CL 99*  --  98*  CO2 24  --  33*  GLUCOSE 113*  --  157*  BUN 70*  --  23*  CREATININE 12.64*  --  6.63*  CALCIUM 9.0  --  9.2  AST 88*  --   --   ALT 79*  --   --   ALKPHOS 56  --   --   BILITOT 0.8  --   --    < > = values in this interval not displayed.   ------------------------------------------------------------------------------------------------------------------  Cardiac Enzymes Recent Labs  Lab 07/23/17 1056 07/23/17 1115  TROPONINI 0.04* TEST WILL BE CREDITED   ------------------------------------------------------------------------------------------------------------------  RADIOLOGY:  Ct Abdomen Pelvis Wo Contrast  Result Date: 07/23/2017 CLINICAL DATA:  Acute generalized abdominal pain. EXAM: CT ABDOMEN AND PELVIS WITHOUT CONTRAST TECHNIQUE: Multidetector CT imaging of the abdomen and pelvis was performed following the standard protocol without IV contrast. COMPARISON:  CT scan of September 09, 2010. FINDINGS: Lower chest: No acute abnormality. Hepatobiliary: No gallstones are noted. Small right hepatic cyst is noted. Pancreas: Unremarkable. No pancreatic ductal  dilatation or surrounding inflammatory changes. Spleen: Normal in size without focal abnormality. Adrenals/Urinary Tract: Adrenal glands are unremarkable. Status post right nephrectomy. Left renal atrophy is noted with  multiple cysts consistent with end-stage renal disease. No hydronephrosis or renal obstruction is noted. Urinary bladder is decompressed. Stomach/Bowel: Stomach is within normal limits. Appendix appears normal. No evidence of bowel wall thickening, distention, or inflammatory changes. Sigmoid diverticulosis is noted without inflammation. Vascular/Lymphatic: No significant vascular findings are present. No enlarged abdominal or pelvic lymph nodes. Reproductive: Stable mild prostatic enlargement is noted. Other: Small fat containing left inguinal hernia is noted. No abnormal fluid collection is noted. Musculoskeletal: No acute or significant osseous findings. IMPRESSION: Status post right nephrectomy. Left renal atrophy is noted consistent with end-stage renal disease. Sigmoid diverticulosis is noted without inflammation. Stable mild prostatic enlargement. Small fat containing left inguinal hernia. Electronically Signed   By: Marijo Conception, M.D.   On: 07/23/2017 08:48    ASSESSMENT AND PLAN:  This is a 62 year old male admitted for hyperkalemia. 1.  Hypokalemia: With associated arrhythmia; needs urgent dialysis.  Given calcium in the emergency department. 2.  ESRD: Consult nephrology for emergent dialysis. 3.  Hypertension: Uncontrolled; resume antihypertensive medication once the patient is completed dialysis 4.  Elevated troponin: Secondary to demand ischemia although notably on EKG septal infarct cannot be ruled out.  To need to monitor telemetry.  Check cardiac biomarkers following dialysis. consult cardiology if troponin increases or arrhythmias persist. 5.  DVT prophylaxis: Heparin 6.  GI prophylaxis: None The patient is a full code.  Time spent on admission orders and critical care approximately 45 minutes.  Discussed with ICU staff (shift change).  All the records are reviewed and case discussed with Care Management/Social Workerr. Management plans discussed with the patient, family and they  are in agreement.  CODE STATUS: full  TOTAL TIME TAKING CARE OF THIS PATIENT: 35 minutes.     POSSIBLE D/C IN 2-3 DAYS, DEPENDING ON CLINICAL CONDITION.   Avel Peace Salary M.D on 07/23/2017   Between 7am to 6pm - Pager - [phone]  After 6pm go to [email or web address] - password [email or web address] [place] [facility]  Office  [phone]  CC: Primary care physician; Letta Median, MD  Note: This dictation was prepared with Dragon dictation along with smaller phrase technology. Any transcriptional errors that result from this process are unintentional.

## 2017-07-24 ENCOUNTER — Other Ambulatory Visit: Payer: Self-pay

## 2017-07-24 LAB — POTASSIUM
POTASSIUM: 6.2 mmol/L — AB (ref 3.5–5.1)
POTASSIUM: 5.7 mmol/L — AB (ref 3.5–5.1)

## 2017-07-24 LAB — MAGNESIUM: MAGNESIUM: 2 mg/dL (ref 1.7–2.4)

## 2017-07-24 LAB — CBC
HCT: 28.7 % — ABNORMAL LOW (ref 40.0–52.0)
HEMOGLOBIN: 9.7 g/dL — AB (ref 13.0–18.0)
MCH: 33 pg (ref 26.0–34.0)
MCHC: 33.8 g/dL (ref 32.0–36.0)
MCV: 97.7 fL (ref 80.0–100.0)
PLATELETS: 195 10*3/uL (ref 150–440)
RBC: 2.94 MIL/uL — ABNORMAL LOW (ref 4.40–5.90)
RDW: 15.1 % — ABNORMAL HIGH (ref 11.5–14.5)
WBC: 6.2 10*3/uL (ref 3.8–10.6)

## 2017-07-24 LAB — BASIC METABOLIC PANEL
Anion gap: 11 (ref 5–15)
BUN: 35 mg/dL — ABNORMAL HIGH (ref 6–20)
CHLORIDE: 97 mmol/L — AB (ref 101–111)
CO2: 32 mmol/L (ref 22–32)
CREATININE: 8.45 mg/dL — AB (ref 0.61–1.24)
Calcium: 8.3 mg/dL — ABNORMAL LOW (ref 8.9–10.3)
GFR calc Af Amer: 7 mL/min — ABNORMAL LOW (ref >60)
GFR calc non Af Amer: 6 mL/min — ABNORMAL LOW (ref >60)
Glucose, Bld: 95 mg/dL (ref 65–99)
POTASSIUM: 7 mmol/L — AB (ref 3.5–5.1)
SODIUM: 140 mmol/L (ref 135–145)

## 2017-07-24 LAB — HEPATITIS B SURFACE ANTIGEN: Hepatitis B Surface Ag: NEGATIVE

## 2017-07-24 LAB — HEPATITIS B SURFACE ANTIBODY, QUANTITATIVE: Hepatitis B-Post: <3.1 m[IU]/mL — ABNORMAL LOW (ref >9.9)

## 2017-07-24 LAB — PHOSPHORUS: PHOSPHORUS: 5.5 mg/dL — AB (ref 2.5–4.6)

## 2017-07-24 LAB — GLUCOSE, CAPILLARY: Glucose-Capillary: 127 mg/dL — ABNORMAL HIGH (ref 65–99)

## 2017-07-24 MED ORDER — PATIROMER SORBITEX CALCIUM 8.4 G PO PACK
16.8000 g | PACK | Freq: Once | ORAL | Status: AC
  Administered 2017-07-24: 21:00:00 16.8 g via ORAL
  Filled 2017-07-24 (×2): qty 4

## 2017-07-24 MED ORDER — FUROSEMIDE 10 MG/ML IJ SOLN
60.0000 mg | Freq: Once | INTRAMUSCULAR | Status: AC
  Administered 2017-07-24: 60 mg via INTRAVENOUS
  Filled 2017-07-24: qty 6

## 2017-07-24 MED ORDER — SODIUM CHLORIDE 0.9 % IV SOLN
1.0000 g | Freq: Once | INTRAVENOUS | Status: AC
  Administered 2017-07-24: 1 g via INTRAVENOUS
  Filled 2017-07-24 (×2): qty 10

## 2017-07-24 MED ORDER — SODIUM POLYSTYRENE SULFONATE 15 GM/60ML PO SUSP
30.0000 g | Freq: Once | ORAL | Status: AC
  Administered 2017-07-24: 30 g via ORAL
  Filled 2017-07-24: qty 120

## 2017-07-24 NOTE — Progress Notes (Signed)
Pt K+ 7.0. MD notified for critical lab. Vitals signs stable, Normal Sinus Rhythm,  no acute distress. Will continue to monitor.

## 2017-07-24 NOTE — Progress Notes (Signed)
CRITICAL VALUE ALERT  Critical Value:  Potassium 7.0     Date & Time Notied: 07/24/2017 8:08 am  Provider Notified: Dr Jerelyn Charles via Shea Evans text message  Orders Received/Actions taken: Interventions ordered included lasix, kayexalate, dialysis

## 2017-07-24 NOTE — Progress Notes (Signed)
[place] at [place]: [place]    MR#:  [id number]  DATE OF BIRTH:  Mar 13, 1956  SUBJECTIVE:  CHIEF COMPLAINT:   Chief Complaint  Patient presents with  . Abdominal Pain  . Diarrhea  Patient without complaint, no events overnight per nursing staff, case discussed with nephrology given repeat hyperkalemia may require repeat hemodialysis  REVIEW OF SYSTEMS:  CONSTITUTIONAL: No fever, fatigue or weakness.  EYES: No blurred or double vision.  EARS, NOSE, AND THROAT: No tinnitus or ear pain.  RESPIRATORY: No cough, shortness of breath, wheezing or hemoptysis.  CARDIOVASCULAR: No chest pain, orthopnea, edema.  GASTROINTESTINAL: No nausea, vomiting, diarrhea or abdominal pain.  GENITOURINARY: No dysuria, hematuria.  ENDOCRINE: No polyuria, nocturia,  HEMATOLOGY: No anemia, easy bruising or bleeding SKIN: No rash or lesion. MUSCULOSKELETAL: No joint pain or arthritis.   NEUROLOGIC: No tingling, numbness, weakness.  PSYCHIATRY: No anxiety or depression.   ROS  DRUG ALLERGIES:   Allergies  Allergen Reactions  . Codeine     swelling    VITALS:  Blood pressure (!) 156/89, pulse 85, temperature 98.1 F (36.7 C), temperature source Oral, resp. rate 14, height 6' (1.829 m), weight 81.9 kg (180 lb 9.6 oz), SpO2 99 %.  PHYSICAL EXAMINATION:  GENERAL:  62 y.o.-year-old patient lying in the bed with no acute distress.  EYES: Pupils equal, round, reactive to light and accommodation. No scleral icterus. Extraocular muscles intact.  HEENT: Head atraumatic, normocephalic. Oropharynx and nasopharynx clear.  NECK:  Supple, no jugular venous distention. No thyroid enlargement, no tenderness.  LUNGS: Normal breath sounds bilaterally, no wheezing, rales,rhonchi or crepitation. No use of accessory muscles of respiration.  CARDIOVASCULAR: S1, S2 normal. No murmurs, rubs, or gallops.  ABDOMEN: Soft, nontender, nondistended. Bowel sounds  present. No organomegaly or mass.  EXTREMITIES: No pedal edema, cyanosis, or clubbing.  NEUROLOGIC: Cranial nerves II through XII are intact. Muscle strength 5/5 in all extremities. Sensation intact. Gait not checked.  PSYCHIATRIC: The patient is alert and oriented x 3.  SKIN: No obvious rash, lesion, or ulcer.   Physical Exam LABORATORY PANEL:   CBC Recent Labs  Lab 07/24/17 0418  WBC 6.2  HGB 9.7*  HCT 28.7*  PLT 195   ------------------------------------------------------------------------------------------------------------------  Chemistries  Recent Labs  Lab 07/23/17 0400  07/24/17 0418 07/24/17 0852  NA 139   < > 140  --   K >7.5*   < > 7.0* 6.2*  CL 99*   < > 97*  --   CO2 24   < > 32  --   GLUCOSE 113*   < > 95  --   BUN 70*   < > 35*  --   CREATININE 12.64*   < > 8.45*  --   CALCIUM 9.0   < > 8.3*  --   MG  --   --  2.0  --   AST 88*  --   --   --   ALT 79*  --   --   --   ALKPHOS 56  --   --   --   BILITOT 0.8  --   --   --    < > = values in this interval not displayed.   ------------------------------------------------------------------------------------------------------------------  Cardiac Enzymes Recent Labs  Lab 07/23/17 1115 07/23/17 1556  TROPONINI TEST WILL BE CREDITED 0.04*   ------------------------------------------------------------------------------------------------------------------  RADIOLOGY:  Ct Abdomen Pelvis Wo Contrast  Result Date: 07/23/2017 CLINICAL  DATA:  Acute generalized abdominal pain. EXAM: CT ABDOMEN AND PELVIS WITHOUT CONTRAST TECHNIQUE: Multidetector CT imaging of the abdomen and pelvis was performed following the standard protocol without IV contrast. COMPARISON:  CT scan of September 09, 2010. FINDINGS: Lower chest: No acute abnormality. Hepatobiliary: No gallstones are noted. Small right hepatic cyst is noted. Pancreas: Unremarkable. No pancreatic ductal dilatation or surrounding inflammatory changes. Spleen: Normal in  size without focal abnormality. Adrenals/Urinary Tract: Adrenal glands are unremarkable. Status post right nephrectomy. Left renal atrophy is noted with multiple cysts consistent with end-stage renal disease. No hydronephrosis or renal obstruction is noted. Urinary bladder is decompressed. Stomach/Bowel: Stomach is within normal limits. Appendix appears normal. No evidence of bowel wall thickening, distention, or inflammatory changes. Sigmoid diverticulosis is noted without inflammation. Vascular/Lymphatic: No significant vascular findings are present. No enlarged abdominal or pelvic lymph nodes. Reproductive: Stable mild prostatic enlargement is noted. Other: Small fat containing left inguinal hernia is noted. No abnormal fluid collection is noted. Musculoskeletal: No acute or significant osseous findings. IMPRESSION: Status post right nephrectomy. Left renal atrophy is noted consistent with end-stage renal disease. Sigmoid diverticulosis is noted without inflammation. Stable mild prostatic enlargement. Small fat containing left inguinal hernia. Electronically Signed   By: Marijo Conception, M.D.   On: 07/23/2017 08:48    ASSESSMENT AND PLAN:  This is a 62 year old male admitted for hyperkalemia/end-stage renal disease.  1 acute hyperkalemia Noted persistence Kayexalate x1 now, [place], calcium gluconate IV x1, IV Lasix, case discussed with nephrology/Dr. Kendell Bane require repeat hemodialysis  2ESRD Nephrology following for hemodialysis needs  3 chronic benign essential hypertension Improved Start hydralazine as needed systolic blood pressure greater than 160  4 acute elevated troponins Secondary to demand ischemia/end-stage renal disease Inconsistent with acute coronary syndrome Most recent echocardiogram from February 2018 noted for ejection fraction 35-40%, will repeat study   All the records are reviewed and case discussed with Care Management/Social Workerr. Management plans discussed  with the patient, family and they are in agreement.  CODE STATUS: full  TOTAL TIME TAKING CARE OF THIS PATIENT: 35 minutes.     POSSIBLE D/C IN 1-3 DAYS, DEPENDING ON CLINICAL CONDITION.   Avel Peace Salary M.D on 07/24/2017   Between 7am to 6pm - Pager - [phone]  After 6pm go to [email or web address] - password [email or web address] [place] [facility]  Office  [phone]  CC: Primary care physician; Letta Median, MD  Note: This dictation was prepared with Dragon dictation along with smaller phrase technology. Any transcriptional errors that result from this process are unintentional.

## 2017-07-24 NOTE — Progress Notes (Signed)
Post dialysis assessement

## 2017-07-24 NOTE — Progress Notes (Signed)
Pre HD Tx  

## 2017-07-24 NOTE — Plan of Care (Signed)
Pt. Exhibit critical lab values. In response to critical lab values MD ordered medication approximately 1000. MD order dialysis which pt. Was transported to approximately 1500. Will continue to monitor pt. Upon arrival back on unit.

## 2017-07-24 NOTE — Progress Notes (Signed)
Pre HD Assessment  

## 2017-07-24 NOTE — Progress Notes (Signed)
[facility], [place] 07/24/17  Subjective:   Patient known to our practice from outpatient dialysis He underwent emergency dialysis for hyperkalemia Intradialytic potassium was normal and post dialysis potassium yesterday was 4.7  This morning, his potassium is noted to be high again at 7.0.  Repeat potassium was 6.2 Patient received medical shifting measures  repeat dialysis at his requested today  Patient denies any shortness of breath.  No edema.  Objective:  Vital signs in last 24 hours:  Temp:  [97.6 F (36.4 C)-98.6 F (37 C)] 98.1 F (36.7 C) (01/16 0335) Pulse Rate:  [80-101] 85 (01/16 0335) Resp:  [8-18] 14 (01/16 0335) BP: (129-168)/(87-105) 156/89 (01/16 0335) SpO2:  [92 %-100 %] 99 % (01/16 0335) Weight:  [81.9 kg (180 lb 9.6 oz)] 81.9 kg (180 lb 9.6 oz) (01/16 0335)  Weight change: 1.899 kg (4 lb 3 oz) Filed Weights   07/23/17 0845 07/23/17 1237 07/24/17 0335  Weight: 84 kg (185 lb 3 oz) 82 kg (180 lb 12.4 oz) 81.9 kg (180 lb 9.6 oz)    Intake/Output:    Intake/Output Summary (Last 24 hours) at 07/24/2017 1319 Last data filed at 07/24/2017 0900 Gross per 24 hour  Intake 480 ml  Output -  Net 480 ml     Physical Exam: General:  No acute distress, laying in the  HEENT  anicteric, moist mucous membranes  Neck  supple  Pulm/lungs  normal breathing effort, clear to auscultation  CVS/Heart  regular rhythm, no rub or gallop  Abdomen:   Soft, mild midepigastric tenderness  Extremities:  No edema  Neurologic:  Alert, oriented  Skin:  No acute rashes  Access:  Left arm AV fistula aneurysmal       Basic Metabolic Panel:  Recent Labs  Lab 07/23/17 0400  07/23/17 1115 07/23/17 1203 07/23/17 1556 07/24/17 0418 07/24/17 0852  NA 139  --   --   --  142 140  --   K >7.5*   < > TEST WILL BE CREDITED 3.8 4.7 7.0* 6.2*  CL 99*  --   --   --  98* 97*  --   CO2 24  --   --   --  33* 32  --   GLUCOSE 113*  --   --   --  157* 95   --   BUN 70*  --   --   --  23* 35*  --   CREATININE 12.64*  --   --   --  6.63* 8.45*  --   CALCIUM 9.0  --   --   --  9.2 8.3*  --   MG  --   --   --   --   --  2.0  --   PHOS  --   --   --   --   --  5.5*  --    < > = values in this interval not displayed.     CBC: Recent Labs  Lab 07/23/17 0400 07/24/17 0418  WBC 8.1 6.2  HGB 10.4* 9.7*  HCT 31.2* 28.7*  MCV 97.7 97.7  PLT 250 195      Lab Results  Component Value Date   HEPBSAG Negative 07/23/2017      Microbiology:  Recent Results (from the past 240 hour(s))  MRSA PCR Screening     Status: None   Collection Time: 07/23/17  6:58 AM  Result Value Ref Range Status   MRSA by  PCR NEGATIVE NEGATIVE Final    Comment:        The GeneXpert MRSA Assay (FDA approved for NASAL specimens only), is one component of a comprehensive MRSA colonization surveillance program. It is not intended to diagnose MRSA infection nor to guide or monitor treatment for MRSA infections. Performed at [facility], [address] [place], [place] [address]     Coagulation Studies: Recent Labs    07/23/17 0457  LABPROT 13.1  INR 1.00    Urinalysis: No results for input(s): COLORURINE, LABSPEC, PHURINE, GLUCOSEU, HGBUR, BILIRUBINUR, KETONESUR, PROTEINUR, UROBILINOGEN, NITRITE, LEUKOCYTESUR in the last 72 hours.  Invalid input(s): APPERANCEUR    Imaging: Ct Abdomen Pelvis Wo Contrast  Result Date: 07/23/2017 CLINICAL DATA:  Acute generalized abdominal pain. EXAM: CT ABDOMEN AND PELVIS WITHOUT CONTRAST TECHNIQUE: Multidetector CT imaging of the abdomen and pelvis was performed following the standard protocol without IV contrast. COMPARISON:  CT scan of September 09, 2010. FINDINGS: Lower chest: No acute abnormality. Hepatobiliary: No gallstones are noted. Small right hepatic cyst is noted. Pancreas: Unremarkable. No pancreatic ductal dilatation or surrounding inflammatory changes. Spleen: Normal in size without focal  abnormality. Adrenals/Urinary Tract: Adrenal glands are unremarkable. Status post right nephrectomy. Left renal atrophy is noted with multiple cysts consistent with end-stage renal disease. No hydronephrosis or renal obstruction is noted. Urinary bladder is decompressed. Stomach/Bowel: Stomach is within normal limits. Appendix appears normal. No evidence of bowel wall thickening, distention, or inflammatory changes. Sigmoid diverticulosis is noted without inflammation. Vascular/Lymphatic: No significant vascular findings are present. No enlarged abdominal or pelvic lymph nodes. Reproductive: Stable mild prostatic enlargement is noted. Other: Small fat containing left inguinal hernia is noted. No abnormal fluid collection is noted. Musculoskeletal: No acute or significant osseous findings. IMPRESSION: Status post right nephrectomy. Left renal atrophy is noted consistent with end-stage renal disease. Sigmoid diverticulosis is noted without inflammation. Stable mild prostatic enlargement. Small fat containing left inguinal hernia. Electronically Signed   By: Marijo Conception, M.D.   On: 07/23/2017 08:48     Medications:   . calcium chloride  IV    . calcium gluconate     . amLODipine  10 mg Oral Daily  . carvedilol  6.25 mg Oral Daily  . cinacalcet  30 mg Oral Q supper  . insulin aspart  5 Units Intravenous Once  . multivitamin  1 tablet Oral QHS  . patiromer  16.8 g Oral Once   acetaminophen **OR** [DISCONTINUED] acetaminophen, ondansetron **OR** ondansetron (ZOFRAN) IV  Assessment/ Plan:  62 y.o. male with end-stage renal disease, cardiomyopathy, anxiety, gout, hypertension, right renal mass, history of nephrectomy, presents for abdominal pain and diarrhea and is found to have severe hypokalemia  [place]/ [facility]/ TTS/Left arm AVF/ 81 kg 1.  End-stage renal disease 2.  Severe hyperkalemia with arrhythmias 3.  Secondary hyperparathyroidism 4.  Anemia of chronic kidney disease  We will  perform extra hemodialysis today for correcting potassium.  We will also consult vascular surgery for angiogram as recirculation in access is suspected May resume home dose of binders and cinacalcet once he is able to eat a normal diet Resume EPO with HD    LOS: [place] 1/16/20191:19 PM  [place], [place]

## 2017-07-24 NOTE — Progress Notes (Signed)
HD Tx Started  

## 2017-07-24 NOTE — Progress Notes (Signed)
Dialysis ended safely

## 2017-07-25 LAB — BASIC METABOLIC PANEL
Anion gap: 10 (ref 5–15)
BUN: 22 mg/dL — AB (ref 6–20)
CO2: 31 mmol/L (ref 22–32)
Calcium: 8.5 mg/dL — ABNORMAL LOW (ref 8.9–10.3)
Chloride: 95 mmol/L — ABNORMAL LOW (ref 101–111)
Creatinine, Ser: 6.29 mg/dL — ABNORMAL HIGH (ref 0.61–1.24)
GFR calc Af Amer: 10 mL/min — ABNORMAL LOW (ref >60)
GFR, EST NON AFRICAN AMERICAN: 9 mL/min — AB (ref >60)
GLUCOSE: 95 mg/dL (ref 65–99)
POTASSIUM: 4.3 mmol/L (ref 3.5–5.1)
Sodium: 136 mmol/L (ref 135–145)

## 2017-07-25 NOTE — Progress Notes (Signed)
MD order received to discharge pt home today; verbally reviewed AVS with pt, no new Rxs; no questions voiced at this time

## 2017-07-25 NOTE — Progress Notes (Signed)
Spoke with Dr Candiss Norse via telephone call advising that the pt was not on the procedure schedule for an angiogram today; Dr Candiss Norse advised that the pt would have to proceed as an out-pt for the angiogram

## 2017-07-25 NOTE — Progress Notes (Signed)
Telephone call with Dr Candiss Norse; ? Pt NPO for what reason; Dr Candiss Norse advised that Dr Lucky Cowboy may be performing an angiogram today; call extension [phone] and see if the pt is on the schedule

## 2017-07-25 NOTE — Progress Notes (Signed)
Pt discharged via wheelchair by auxillary to the visitor's entrance who in turn will have the courtesy car transport pt to his car that is parked in the ED parking lot

## 2017-07-25 NOTE — Care Management (Signed)
Notified Estill Bamberg with [employer] that patient discharged.

## 2017-07-25 NOTE — Progress Notes (Signed)
[facility], [place] 07/25/17  Subjective:   Patient known to our practice from outpatient dialysis He underwent emergency dialysis for hyperkalemia .  No shortness of breath.  At present he is doing well His potassium level this morning is 4.3  Objective:  Vital signs in last 24 hours:  Temp:  [98.1 F (36.7 C)-98.9 F (37.2 C)] 98.7 F (37.1 C) (01/17 1226) Pulse Rate:  [83-99] 83 (01/17 1226) Resp:  [10-16] 16 (01/17 1226) BP: (149-184)/(83-119) 155/86 (01/17 1226) SpO2:  [97 %-100 %] 99 % (01/17 1226) Weight:  [81.9 kg (180 lb 9.6 oz)-82.9 kg (182 lb 12.8 oz)] 82.9 kg (182 lb 12.8 oz) (01/17 0450)  Weight change: -2.08 kg (-9.4 oz) Filed Weights   07/24/17 0335 07/24/17 1455 07/25/17 0450  Weight: 81.9 kg (180 lb 9.6 oz) 81.9 kg (180 lb 9.6 oz) 82.9 kg (182 lb 12.8 oz)    Intake/Output:    Intake/Output Summary (Last 24 hours) at 07/25/2017 1400 Last data filed at 07/25/2017 0300 Gross per 24 hour  Intake 0 ml  Output 509 ml  Net -509 ml     Physical Exam: General:  No acute distress, laying in the  HEENT  anicteric, moist mucous membranes  Neck  supple  Pulm/lungs  normal breathing effort, clear to auscultation  CVS/Heart  regular rhythm, no rub or gallop  Abdomen:   Soft, mild midepigastric tenderness  Extremities:  No edema  Neurologic:  Alert, oriented  Skin:  No acute rashes  Access:  Left arm AV fistula aneurysmal       Basic Metabolic Panel:  Recent Labs  Lab 07/23/17 0400  07/23/17 1556 07/24/17 0418 07/24/17 0852 07/24/17 1624 07/25/17 0448  NA 139  --  142 140  --   --  136  K >7.5*   < > 4.7 7.0* 6.2* 5.7* 4.3  CL 99*  --  98* 97*  --   --  95*  CO2 24  --  33* 32  --   --  31  GLUCOSE 113*  --  157* 95  --   --  95  BUN 70*  --  23* 35*  --   --  22*  CREATININE 12.64*  --  6.63* 8.45*  --   --  6.29*  CALCIUM 9.0  --  9.2 8.3*  --   --  8.5*  MG  --   --   --  2.0  --   --   --   PHOS  --   --   --   5.5*  --   --   --    < > = values in this interval not displayed.     CBC: Recent Labs  Lab 07/23/17 0400 07/24/17 0418  WBC 8.1 6.2  HGB 10.4* 9.7*  HCT 31.2* 28.7*  MCV 97.7 97.7  PLT 250 195      Lab Results  Component Value Date   HEPBSAG Negative 07/23/2017      Microbiology:  Recent Results (from the past 240 hour(s))  MRSA PCR Screening     Status: None   Collection Time: 07/23/17  6:58 AM  Result Value Ref Range Status   MRSA by PCR NEGATIVE NEGATIVE Final    Comment:        The GeneXpert MRSA Assay (FDA approved for NASAL specimens only), is one component of a comprehensive MRSA colonization surveillance program. It is not intended to diagnose MRSA infection nor  to guide or monitor treatment for MRSA infections. Performed at [facility], [address] [place], [place] [address]     Coagulation Studies: Recent Labs    07/23/17 0457  LABPROT 13.1  INR 1.00    Urinalysis: No results for input(s): COLORURINE, LABSPEC, PHURINE, GLUCOSEU, HGBUR, BILIRUBINUR, KETONESUR, PROTEINUR, UROBILINOGEN, NITRITE, LEUKOCYTESUR in the last 72 hours.  Invalid input(s): APPERANCEUR    Imaging: No results found.   Medications:   . calcium chloride  IV     . amLODipine  10 mg Oral Daily  . carvedilol  6.25 mg Oral Daily  . cinacalcet  30 mg Oral Q supper  . insulin aspart  5 Units Intravenous Once  . multivitamin  1 tablet Oral QHS   acetaminophen **OR** [DISCONTINUED] acetaminophen, ondansetron **OR** ondansetron (ZOFRAN) IV  Assessment/ Plan:  62 y.o. male with end-stage renal disease, cardiomyopathy, anxiety, gout, hypertension, right renal mass, history of nephrectomy, presents for abdominal pain and diarrhea and is found to have severe hypokalemia  [place]/ [facility]/ TTS/Left arm AVF/ 81 kg 1.  End-stage renal disease 2.  Severe hyperkalemia with arrhythmias 3.  Secondary hyperparathyroidism 4.  Anemia of chronic kidney  disease  Patient will resume his normal outpatient dialysis schedule starting Saturday Discussed with Dr. Lucky Cowboy, his angiogram is scheduled for tomorrow morning as an outpatient Patient was instructed to come in NPO to the outpatient procedures area Okay to discharge home from renal standpoint     LOS: 2 Christopher Hickman 1/17/20192:00 PM  [place], [place]

## 2017-07-25 NOTE — Progress Notes (Signed)
Spoke with Dr Jerelyn Charles via telephone call; advised him of previous notes involving Dr Candiss Norse and tentative schedule; Dr Jerelyn Charles advised that he would be discharging the pt home today

## 2017-07-25 NOTE — Discharge Summary (Signed)
[place] [facility] [place] NAME: Christopher Hickman    MR#:  [id number]  DATE OF BIRTH:  October 20, 1955  DATE OF ADMISSION:  07/23/2017 ADMITTING PHYSICIAN: Harrie Foreman, MD  DATE OF DISCHARGE: No discharge date for patient encounter.  PRIMARY CARE PHYSICIAN: Letta Median, MD    ADMISSION DIAGNOSIS:  Hyperkalemia [E87.5] Gastrointestinal hemorrhage, unspecified gastrointestinal hemorrhage type [K92.2]  DISCHARGE DIAGNOSIS:  Active Problems:   GIB (gastrointestinal bleeding)   SECONDARY DIAGNOSIS:   Past Medical History:  Diagnosis Date  . Anemia of chronic kidney failure   . Anxiety and depression   . Cardiomyopathy ([place])    a. prior EF 40%, now with normal EF as of 02/2016  . End stage renal disease ([place])   . Glaucoma   . Gout   . Hyperparathyroidism ([place])   . Hypertension   . Right renal mass     HOSPITAL COURSE:  This is a 63 year old male admitted for hyperkalemia/end-stage renal disease.  1 acute hyperkalemia Resolved with kayexalate, Veltassa, calcium gluconate IV x1, IV Lasix, and dialysis   2ESRD Nephrology to see patient while in house Patient to have a fistulogram on tomorrow for further evaluation by vascular surgery  3 chronic benign essential hypertension Controlled on current regiment  4 acute elevated troponins Secondary to demand ischemia/end-stage renal disease Inconsistent with acute coronary syndrome Most recent echocardiogram from February 2018 noted for ejection fraction 35-40%, will repeat study   DISCHARGE CONDITIONS:   On the day of discharge patient is afebrile, hemogram stable, tolerating diet, regular discharge home with appropriate follow-up with vascular surgery on tomorrow for reevaluation of AV fistula, will resume outpatient hemodialysis, follow with primary care provider in 2-4 days for reevaluation of all other chronic maladies  CONSULTS OBTAINED:  Treatment  Team:  Murlean Iba, MD [name], MD  DRUG ALLERGIES:   Allergies  Allergen Reactions  . Codeine     swelling    DISCHARGE MEDICATIONS:   Allergies as of 07/25/2017      Reactions   Codeine    swelling      Medication List    TAKE these medications   amLODipine 10 MG tablet Commonly known as:  NORVASC Take 10 mg by mouth daily.   b complex-C-folic acid 1 MG capsule Take 1 capsule by mouth daily.   calcium acetate 667 MG capsule Commonly known as:  PHOSLO Take 2,001 mg by mouth 3 (three) times daily with meals.   carvedilol 6.25 MG tablet Commonly known as:  COREG Take 6.25 mg by mouth daily.   SENSIPAR 30 MG tablet Generic drug:  cinacalcet Take 30 mg by mouth daily.   valsartan 160 MG tablet Commonly known as:  DIOVAN Take 160 mg by mouth at bedtime.        DISCHARGE INSTRUCTIONS:    If you experience worsening of your admission symptoms, develop shortness of breath, life threatening emergency, suicidal or homicidal thoughts you must seek medical attention immediately by calling 911 or calling your MD immediately  if symptoms less severe.  You Must read complete instructions/literature along with all the possible adverse reactions/side effects for all the Medicines you take and that have been prescribed to you. Take any new Medicines after you have completely understood and accept all the possible adverse reactions/side effects.   Please note  You were cared for by a hospitalist during your hospital stay. If you have any questions about your discharge medications  or the care you received while you were in the hospital after you are discharged, you can call the unit and asked to speak with the hospitalist on call if the hospitalist that took care of you is not available. Once you are discharged, your primary care physician will handle any further medical issues. Please note that NO REFILLS for any discharge medications will be authorized once you are  discharged, as it is imperative that you return to your primary care physician (or establish a relationship with a primary care physician if you do not have one) for your aftercare needs so that they can reassess your need for medications and monitor your lab values.    Today   CHIEF COMPLAINT:   Chief Complaint  Patient presents with  . Abdominal Pain  . Diarrhea    HISTORY OF PRESENT ILLNESS:  62 y.o. male with end-stage renal disease, cardiomyopathy, anxiety, gout, hypertension, right renal mass, history of nephrectomy, presents for abdominal pain and diarrhea and is found to have severe hypokalemia   VITAL SIGNS:  Blood pressure (!) 155/86, pulse 83, temperature 98.7 F (37.1 C), temperature source Oral, resp. rate 16, height 6' (1.829 m), weight 82.9 kg (182 lb 12.8 oz), SpO2 99 %.  I/O:    Intake/Output Summary (Last 24 hours) at 07/25/2017 1407 Last data filed at 07/25/2017 0300 Gross per 24 hour  Intake 0 ml  Output 509 ml  Net -509 ml    PHYSICAL EXAMINATION:  GENERAL:  62 y.o.-year-old patient lying in the bed with no acute distress.  EYES: Pupils equal, round, reactive to light and accommodation. No scleral icterus. Extraocular muscles intact.  HEENT: Head atraumatic, normocephalic. Oropharynx and nasopharynx clear.  NECK:  Supple, no jugular venous distention. No thyroid enlargement, no tenderness.  LUNGS: Normal breath sounds bilaterally, no wheezing, rales,rhonchi or crepitation. No use of accessory muscles of respiration.  CARDIOVASCULAR: S1, S2 normal. No murmurs, rubs, or gallops.  ABDOMEN: Soft, non-tender, non-distended. Bowel sounds present. No organomegaly or mass.  EXTREMITIES: No pedal edema, cyanosis, or clubbing.  NEUROLOGIC: Cranial nerves II through XII are intact. Muscle strength 5/5 in all extremities. Sensation intact. Gait not checked.  PSYCHIATRIC: The patient is alert and oriented x 3.  SKIN: No obvious rash, lesion, or ulcer.   DATA  REVIEW:   CBC Recent Labs  Lab 07/24/17 0418  WBC 6.2  HGB 9.7*  HCT 28.7*  PLT 195    Chemistries  Recent Labs  Lab 07/23/17 0400  07/24/17 0418  07/25/17 0448  NA 139   < > 140  --  136  K >7.5*   < > 7.0*   < > 4.3  CL 99*   < > 97*  --  95*  CO2 24   < > 32  --  31  GLUCOSE 113*   < > 95  --  95  BUN 70*   < > 35*  --  22*  CREATININE 12.64*   < > 8.45*  --  6.29*  CALCIUM 9.0   < > 8.3*  --  8.5*  MG  --   --  2.0  --   --   AST 88*  --   --   --   --   ALT 79*  --   --   --   --   ALKPHOS 56  --   --   --   --   BILITOT 0.8  --   --   --   --    < > =  values in this interval not displayed.    Cardiac Enzymes Recent Labs  Lab 07/23/17 1556  TROPONINI 0.04*    Microbiology Results  Results for orders placed or performed during the hospital encounter of 07/23/17  MRSA PCR Screening     Status: None   Collection Time: 07/23/17  6:58 AM  Result Value Ref Range Status   MRSA by PCR NEGATIVE NEGATIVE Final    Comment:        The GeneXpert MRSA Assay (FDA approved for NASAL specimens only), is one component of a comprehensive MRSA colonization surveillance program. It is not intended to diagnose MRSA infection nor to guide or monitor treatment for MRSA infections. Performed at [facility], [address] [place], [place] [address]     RADIOLOGY:  No results found.  EKG:   Orders placed or performed during the hospital encounter of 07/23/17  . EKG 12-Lead  . EKG 12-Lead  . ED EKG  . ED EKG  . EKG 12-Lead  . EKG 12-Lead      Management plans discussed with the patient, family and they are in agreement.  CODE STATUS:     Code Status Orders  (From admission, onward)        Start     Ordered   07/23/17 0657  Full code  Continuous     07/23/17 0656    Code Status History    Date Active Date Inactive Code Status Order ID Comments User Context   08/28/2016 09:03 08/29/2016 22:53 Full Code [id number]  Saundra Shelling, MD ED       TOTAL TIME TAKING CARE OF THIS PATIENT: 45 minutes.    Avel Peace Salary M.D on 07/25/2017 at 2:07 PM  Between 7am to 6pm - Pager - [phone]  After 6pm go to [email or web address] - password [email or web address] [facility]  Office  [phone]  CC: Primary care physician; Letta Median, MD   Note: This dictation was prepared with Dragon dictation along with smaller phrase technology. Any transcriptional errors that result from this process are unintentional.

## 2017-07-25 NOTE — Progress Notes (Signed)
Upon returning from lunch; extension [phone] called to advise the pt was not on their schedule for any procedure today

## 2017-07-25 NOTE — Progress Notes (Signed)
Telephone call to extension [phone] left voicemail message asking if the pt was on the schedule for Dr Lucky Cowboy for an angiogram

## 2017-07-26 ENCOUNTER — Ambulatory Visit
Admission: RE | Admit: 2017-07-26 | Discharge: 2017-07-26 | Disposition: A | Discharge status: Home / Self Care | Payer: Medicare Other | Source: Ambulatory Visit | Attending: Vascular Surgery | Admitting: Vascular Surgery

## 2017-07-26 ENCOUNTER — Encounter: Payer: Self-pay | Admitting: *Deleted

## 2017-07-26 ENCOUNTER — Encounter
Admission: RE | Disposition: A | Discharge status: Home / Self Care | Payer: Self-pay | Source: Ambulatory Visit | Attending: Vascular Surgery

## 2017-07-26 ENCOUNTER — Ambulatory Visit (INDEPENDENT_AMBULATORY_CARE_PROVIDER_SITE_OTHER): Payer: Self-pay | Admitting: Vascular Surgery

## 2017-07-26 ENCOUNTER — Other Ambulatory Visit (INDEPENDENT_AMBULATORY_CARE_PROVIDER_SITE_OTHER): Payer: Self-pay

## 2017-07-26 DIAGNOSIS — Z905 Acquired absence of kidney: Secondary | ICD-10-CM | POA: Diagnosis not present

## 2017-07-26 DIAGNOSIS — Z7982 Long term (current) use of aspirin: Secondary | ICD-10-CM | POA: Insufficient documentation

## 2017-07-26 DIAGNOSIS — Z9889 Other specified postprocedural states: Secondary | ICD-10-CM | POA: Insufficient documentation

## 2017-07-26 DIAGNOSIS — I5022 Chronic systolic (congestive) heart failure: Secondary | ICD-10-CM | POA: Diagnosis not present

## 2017-07-26 DIAGNOSIS — Z992 Dependence on renal dialysis: Secondary | ICD-10-CM | POA: Diagnosis not present

## 2017-07-26 DIAGNOSIS — Z885 Allergy status to narcotic agent status: Secondary | ICD-10-CM | POA: Diagnosis not present

## 2017-07-26 DIAGNOSIS — I429 Cardiomyopathy, unspecified: Secondary | ICD-10-CM | POA: Diagnosis not present

## 2017-07-26 DIAGNOSIS — Z8249 Family history of ischemic heart disease and other diseases of the circulatory system: Secondary | ICD-10-CM | POA: Diagnosis not present

## 2017-07-26 DIAGNOSIS — Y832 Surgical operation with anastomosis, bypass or graft as the cause of abnormal reaction of the patient, or of later complication, without mention of misadventure at the time of the procedure: Secondary | ICD-10-CM | POA: Insufficient documentation

## 2017-07-26 DIAGNOSIS — E0590 Thyrotoxicosis, unspecified without thyrotoxic crisis or storm: Secondary | ICD-10-CM | POA: Insufficient documentation

## 2017-07-26 DIAGNOSIS — T82590A Other mechanical complication of surgically created arteriovenous fistula, initial encounter: Secondary | ICD-10-CM | POA: Diagnosis not present

## 2017-07-26 DIAGNOSIS — H409 Unspecified glaucoma: Secondary | ICD-10-CM | POA: Diagnosis not present

## 2017-07-26 DIAGNOSIS — N186 End stage renal disease: Secondary | ICD-10-CM

## 2017-07-26 DIAGNOSIS — I132 Hypertensive heart and chronic kidney disease with heart failure and with stage 5 chronic kidney disease, or end stage renal disease: Secondary | ICD-10-CM | POA: Insufficient documentation

## 2017-07-26 DIAGNOSIS — T82868A Thrombosis of vascular prosthetic devices, implants and grafts, initial encounter: Secondary | ICD-10-CM | POA: Diagnosis not present

## 2017-07-26 DIAGNOSIS — M109 Gout, unspecified: Secondary | ICD-10-CM | POA: Insufficient documentation

## 2017-07-26 DIAGNOSIS — N289 Disorder of kidney and ureter, unspecified: Secondary | ICD-10-CM | POA: Insufficient documentation

## 2017-07-26 DIAGNOSIS — Z79899 Other long term (current) drug therapy: Secondary | ICD-10-CM | POA: Diagnosis not present

## 2017-07-26 HISTORY — PX: A/V FISTULAGRAM: CATH118298

## 2017-07-26 LAB — POTASSIUM (ARMC VASCULAR LAB ONLY): POTASSIUM (ARMC VASCULAR LAB): 4.2 (ref 3.5–5.1)

## 2017-07-26 SURGERY — A/V FISTULAGRAM
Anesthesia: Moderate Sedation | Laterality: Left

## 2017-07-26 MED ORDER — HEPARIN SODIUM (PORCINE) 1000 UNIT/ML IJ SOLN
INTRAMUSCULAR | Status: AC
  Filled 2017-07-26: qty 1

## 2017-07-26 MED ORDER — LIDOCAINE HCL (PF) 1 % IJ SOLN
INTRAMUSCULAR | Status: AC
  Filled 2017-07-26: qty 30

## 2017-07-26 MED ORDER — FENTANYL CITRATE (PF) 100 MCG/2ML IJ SOLN
INTRAMUSCULAR | Status: DC | PRN
  Administered 2017-07-26 (×2): 12.5 ug via INTRAVENOUS
  Administered 2017-07-26: 50 ug via INTRAVENOUS
  Administered 2017-07-26: 25 ug via INTRAVENOUS

## 2017-07-26 MED ORDER — SODIUM CHLORIDE 0.9 % IV SOLN: INTRAVENOUS | Status: DC

## 2017-07-26 MED ORDER — HEPARIN (PORCINE) IN NACL 2-0.9 UNIT/ML-% IJ SOLN
INTRAMUSCULAR | Status: AC
  Filled 2017-07-26: qty 1000

## 2017-07-26 MED ORDER — CEFAZOLIN SODIUM-DEXTROSE 1-4 GM/50ML-% IV SOLN
1.0000 g | Freq: Once | INTRAVENOUS | Status: AC
  Administered 2017-07-26: 1 g via INTRAVENOUS

## 2017-07-26 MED ORDER — MIDAZOLAM HCL 2 MG/2ML IJ SOLN
INTRAMUSCULAR | Status: DC | PRN
  Administered 2017-07-26 (×3): 1 mg via INTRAVENOUS
  Administered 2017-07-26: 2 mg via INTRAVENOUS

## 2017-07-26 MED ORDER — MIDAZOLAM HCL 5 MG/5ML IJ SOLN
INTRAMUSCULAR | Status: AC
  Filled 2017-07-26: qty 5

## 2017-07-26 MED ORDER — FENTANYL CITRATE (PF) 100 MCG/2ML IJ SOLN
INTRAMUSCULAR | Status: AC
  Filled 2017-07-26: qty 2

## 2017-07-26 SURGICAL SUPPLY — 7 items
DRAPE BRACHIAL (DRAPES) ×3 IMPLANT
NEEDLE ENTRY 21GA 7CM ECHOTIP (NEEDLE) ×3 IMPLANT
PACK ANGIOGRAPHY (CUSTOM PROCEDURE TRAY) ×3 IMPLANT
SET INTRO CAPELLA COAXIAL (SET/KITS/TRAYS/PACK) ×3 IMPLANT
SHEATH BRITE TIP 6FRX5.5 (SHEATH) ×3 IMPLANT
SUT MNCRL AB 4-0 PS2 18 (SUTURE) ×3 IMPLANT
TOWEL OR 17X26 4PK STRL BLUE (TOWEL DISPOSABLE) ×3 IMPLANT

## 2017-07-26 NOTE — Op Note (Signed)
OPERATIVE NOTE   PROCEDURE: 1. Contrast injection left arm AV fistula  PRE-OPERATIVE DIAGNOSIS: Complication of dialysis access                                                       End Stage Renal Disease  POST-OPERATIVE DIAGNOSIS: same as above   SURGEON: Katha Cabal, M.D.  ANESTHESIA: Conscious sedation was administered under my direct supervision by the interventional radiology RN.  IV Versed plus fentanyl were utilized. Continuous ECG, pulse oximetry and blood pressure was monitored throughout the entire procedure.  Conscious sedation was for a total of 17 minutes.  ESTIMATED BLOOD LOSS: minimal  FINDING(S): 1. Very large aneurysmal portion distally  SPECIMEN(S):  None  CONTRAST: 25 cc  FLUOROSCOPY TIME: 3 minutes  INDICATIONS: Christopher Hickman is a 62 y.o. male who  presents with malfunctioning left AV access.  The patient is scheduled for angiography with possible intervention of the AV access to prevent loss of the permanent access.  The patient is aware the risks include but are not limited to: bleeding, infection, thrombosis of the cannulated access, and possible anaphylactic reaction to the contrast.  The patient acknowledges if the access can not be salvaged a tunneled catheter will be needed and will be placed during this procedure.  The patient is aware of the risks of the procedure and elects to proceed with the angiogram and intervention.  DESCRIPTION: After full informed written consent was obtained, the patient was brought back to the Special Procedure suite and placed supine position.  Appropriate cardiopulmonary monitors were placed.  The left arm was prepped and draped in the standard fashion.  Appropriate timeout is called.   The left radiocephalic fistula access was cannulated with a micropuncture needle under ultrasound guidence.  Ultrasound was used to evaluate the left radiocephalic fistula access.  It was echolucent and compressible indicating it  is patent .  An ultrasound image was acquired for the permanent record.  A micropuncture needle was used to access the left radial cephalic access under direct ultrasound guidance.  The microwire was then advanced under fluoroscopic guidance without difficulty followed by the micro-sheath.  The J-wire was then advanced and a 6 Fr sheath inserted.  Hand injections were completed to image the access from the arterial anastomosis through the entire access.  The central venous structures were also imaged by hand injections.  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  The basilic as well as the cephalic veins in the upper arm are both widely patent and measure 8-10 mm in diameter.  Central veins are widely patent.    A 4-0 Monocryl purse-string suture was sewn around the sheath.  The sheath was removed and light pressure was applied.  A sterile bandage was applied to the puncture site.   Summary: The patient's recirculation is secondary to the aneurysmal deterioration of his fistula.  There is a moderate stenosis of the inflow portion of the anastomosis noted on the reflux imaging.  Given the size of his aneurysms as well as this moderate irregularity at the anastomotic area I do not believe placing a forearm graft around the existing fistula will be feasible.  Given the near fully matured cephalic vein as well as basilic vein in the upper arm simple brachiocephalic  fistula creation would allow for immediate cannulation.  I would favor this approach and then ligation and excision of the AV aneurysmal portions.   COMPLICATIONS: None  CONDITION: Christopher Hickman, M.D [facility] Office: [phone]  07/26/2017 1:47 PM

## 2017-07-26 NOTE — Discharge Instructions (Signed)
Fistulografía, cuidados posteriores °(Fistulogram, Care After) °Siga estas instrucciones durante las próximas semanas. Estas indicaciones le proporcionan información general acerca de cómo deberá cuidarse después del procedimiento. El médico también podrá darle instrucciones más específicas. El tratamiento ha sido planificado según las prácticas médicas actuales, pero en algunos casos pueden ocurrir problemas. Comuníquese con el médico si tiene algún problema o tiene dudas después del procedimiento. °QUÉ ESPERAR DESPUÉS DEL PROCEDIMIENTO °Después del procedimiento, es normal tener: °· Una pequeña molestia en la zona donde se colocaron los catéteres. °· Un pequeño hematoma alrededor de la fístula. °· Somnolencia y fatiga. °INSTRUCCIONES PARA EL CUIDADO EN EL HOGAR °· Haga reposo en su casa, el día después del procedimiento. °· No conduzca ni opere maquinaria pesada mientras toma analgésicos. °· Tome los medicamentos solamente como se lo haya indicado el médico. °· No tome baños de inmersión, no nade ni use el jacuzzi hasta que el médico lo autorice. Puede ducharse 24 horas después del procedimiento o según las indicaciones del médico. °· Hay muchas maneras distintas de cerrar y cubrir una incisión, como puntos, pegamento para la piel y tiras adhesivas. Siga todas las indicaciones del médico respecto a lo siguiente: °? Cuidados de la herida. °? Cambiar y quitar el vendaje. °? Quitar el cierre de la incisión. °· Controle cuidadosamente la fístula de diálisis. °SOLICITE ATENCIÓN MÉDICA SI: °· Tiene secreción, enrojecimiento, hinchazón o dolor en el lugar de inserción del catéter. °· Tiene fiebre. °· Tiene escalofríos. °SOLICITE ATENCIÓN MÉDICA DE INMEDIATO SI: °· Se siente débil. °· Tiene problemas de equilibrio. °· Tiene dificultad para mover los brazos o las piernas. °· Tiene problemas visuales o para hablar. °· Ya no puede sentir una vibración o un zumbido cuando coloca los dedos sobre la fístula de diálisis. °· La  extremidad que se usó para el procedimiento: °? Se hincha. °? Duele. °? Está fría. °? Cambia de color, por ejemplo, se torna azulado o blanco pálido. °Esta información no tiene como fin reemplazar el consejo del médico. Asegúrese de hacerle al médico cualquier pregunta que tenga. °Document Released: 11/09/2013 Document Revised: 11/09/2013 Document Reviewed: 08/14/2013 °Elsevier Interactive Patient Education © 2018 Elsevier Inc. ° °

## 2017-07-26 NOTE — H&P (Signed)
[facility] History & Physical Update  The patient was interviewed and re-examined.  The patient's previous History and Physical has been reviewed and is unchanged.  There is no change in the plan of care. We plan to proceed with the scheduled procedure.  Hortencia Pilar, MD  07/26/2017, 10:47 AM

## 2017-07-29 ENCOUNTER — Encounter: Payer: Self-pay | Admitting: Vascular Surgery

## 2017-08-26 ENCOUNTER — Encounter (INDEPENDENT_AMBULATORY_CARE_PROVIDER_SITE_OTHER): Payer: Self-pay

## 2017-08-26 ENCOUNTER — Other Ambulatory Visit (INDEPENDENT_AMBULATORY_CARE_PROVIDER_SITE_OTHER): Payer: Self-pay | Admitting: Vascular Surgery

## 2017-08-26 ENCOUNTER — Ambulatory Visit (INDEPENDENT_AMBULATORY_CARE_PROVIDER_SITE_OTHER): Payer: Medicare Other | Admitting: Vascular Surgery

## 2017-08-26 ENCOUNTER — Ambulatory Visit (INDEPENDENT_AMBULATORY_CARE_PROVIDER_SITE_OTHER): Payer: Medicare Other

## 2017-08-26 DIAGNOSIS — T829XXA Unspecified complication of cardiac and vascular prosthetic device, implant and graft, initial encounter: Secondary | ICD-10-CM | POA: Diagnosis not present

## 2017-08-26 DIAGNOSIS — N186 End stage renal disease: Secondary | ICD-10-CM | POA: Diagnosis not present

## 2017-09-03 ENCOUNTER — Ambulatory Visit: Admit: 2017-09-03 | Discharge: 2017-09-10 | Disposition: A | Discharge status: Home / Self Care | Payer: MEDICARE

## 2017-09-03 ENCOUNTER — Encounter
Admit: 2017-09-03 | Discharge: 2017-09-10 | Disposition: A | Discharge status: Home / Self Care | Payer: MEDICARE | Attending: Surgery

## 2017-09-03 ENCOUNTER — Encounter: Admit: 2017-09-03 | Discharge: 2017-09-10 | Disposition: A | Discharge status: Home / Self Care | Payer: MEDICARE

## 2017-09-03 DIAGNOSIS — I120 Hypertensive chronic kidney disease with stage 5 chronic kidney disease or end stage renal disease: Principal | ICD-10-CM

## 2017-09-03 DIAGNOSIS — Z529 Donor of unspecified organ or tissue: Principal | ICD-10-CM

## 2017-09-04 DIAGNOSIS — I120 Hypertensive chronic kidney disease with stage 5 chronic kidney disease or end stage renal disease: Principal | ICD-10-CM

## 2017-09-04 MED ORDER — MYFORTIC 180 MG TABLET,DELAYED RELEASE
ORAL_TABLET | Freq: Two times a day (BID) | ORAL | 11 refills | 0.00000 days | Status: CP
Start: 2017-09-04 — End: 2017-09-23

## 2017-09-04 MED ORDER — PROGRAF 1 MG CAPSULE: 5 mg | capsule | Freq: Two times a day (BID) | 11 refills | 0 days | Status: AC

## 2017-09-04 MED ORDER — VALGANCICLOVIR 450 MG TABLET: 450 mg | tablet | Freq: Every day | 2 refills | 0 days | Status: AC

## 2017-09-04 MED ORDER — VALGANCICLOVIR 450 MG TABLET
ORAL_TABLET | Freq: Every day | ORAL | 2 refills | 0.00000 days | Status: CP
Start: 2017-09-04 — End: 2017-10-08

## 2017-09-04 MED ORDER — MYFORTIC 180 MG TABLET,DELAYED RELEASE: tablet | 11 refills | 0 days

## 2017-09-04 MED ORDER — PROGRAF 1 MG CAPSULE
ORAL_CAPSULE | Freq: Two times a day (BID) | ORAL | 11 refills | 0.00000 days | Status: CP
Start: 2017-09-04 — End: 2017-09-06

## 2017-09-04 MED FILL — VALGANCICLOVIR/450MG/TABS: VALGANCICLOVIR/450MG/TABS | 30 days supply | Qty: 30 | Fill #0

## 2017-09-04 MED FILL — MYFORTIC/180MG/TAB: MYFORTIC/180MG/TAB | 30 days supply | Qty: 180 | Fill #0

## 2017-09-05 MED ORDER — ACETAMINOPHEN 500 MG TABLET: tablet | Freq: Three times a day (TID) | 0 refills | 0 days | Status: AC

## 2017-09-05 MED ORDER — FAMOTIDINE 20 MG TABLET
0 refills | 0 days
Start: 2017-09-05 — End: 2017-09-05

## 2017-09-05 MED ORDER — GABAPENTIN 100 MG CAPSULE
ORAL_CAPSULE | Freq: Every evening | ORAL | 0 refills | 0.00000 days | Status: CP
Start: 2017-09-05 — End: 2018-02-25

## 2017-09-05 MED ORDER — POLYETHYLENE GLYCOL 3350 17 GRAM/DOSE ORAL POWDER
0 refills | 0 days
Start: 2017-09-05 — End: 2018-02-25

## 2017-09-05 MED ORDER — TRAMADOL 50 MG TABLET: each | 0 refills | 0 days

## 2017-09-05 MED ORDER — POLYETHYLENE GLYCOL 3350 17 GRAM ORAL POWDER PACKET
PACK | Freq: Every day | ORAL | 0 refills | 0 days | Status: CP | PRN
Start: 2017-09-05 — End: 2017-10-08

## 2017-09-05 MED ORDER — SULFAMETHOXAZOLE 400 MG-TRIMETHOPRIM 80 MG TABLET: 1 | tablet | 5 refills | 0 days | Status: AC

## 2017-09-05 MED ORDER — ASPIRIN 81 MG TABLET,DELAYED RELEASE
Freq: Every day | ORAL | 11 refills | 0.00000 days | Status: CP
Start: 2017-09-05 — End: 2017-09-05

## 2017-09-05 MED ORDER — GABAPENTIN 100 MG CAPSULE: capsule | 0 refills | 0 days

## 2017-09-05 MED ORDER — MAGNESIUM OXIDE-MAGNESIUM AMINO ACID CHELATE 133 MG TABLET
Freq: Two times a day (BID) | ORAL | 11 refills | 0.00000 days | Status: CP
Start: 2017-09-05 — End: 2017-09-05

## 2017-09-05 MED ORDER — TRAMADOL 50 MG TABLET
ORAL_TABLET | Freq: Two times a day (BID) | ORAL | 0 refills | 0.00000 days | Status: CP | PRN
Start: 2017-09-05 — End: 2017-09-05

## 2017-09-05 MED ORDER — DOCUSATE SODIUM 100 MG CAPSULE
ORAL_CAPSULE | Freq: Two times a day (BID) | ORAL | 0 refills | 0.00000 days | Status: CP | PRN
Start: 2017-09-05 — End: 2018-02-25

## 2017-09-05 MED ORDER — DOCUSATE SODIUM 100 MG CAPSULE: 100 mg | capsule | Freq: Two times a day (BID) | 0 refills | 0 days | Status: AC

## 2017-09-05 MED ORDER — MAGNESIUM OXIDE-MAGNESIUM AMINO ACID CHELATE 133 MG TABLET: each | 11 refills | 0 days

## 2017-09-05 MED ORDER — CARVEDILOL 6.25 MG TABLET
ORAL_TABLET | Freq: Two times a day (BID) | ORAL | 0 refills | 0 days | Status: CP
Start: 2017-09-05 — End: 2017-10-08

## 2017-09-05 MED ORDER — ASPIRIN 81 MG TABLET,DELAYED RELEASE: 81 mg | tablet | Freq: Every day | 11 refills | 0 days | Status: AC

## 2017-09-05 MED ORDER — ACETAMINOPHEN 500 MG TABLET
ORAL_TABLET | Freq: Three times a day (TID) | ORAL | 0 refills | 0.00000 days | Status: CP | PRN
Start: 2017-09-05 — End: 2017-10-08

## 2017-09-05 MED ORDER — SULFAMETHOXAZOLE 400 MG-TRIMETHOPRIM 80 MG TABLET
5 refills | 0 days
Start: 2017-09-05 — End: 2017-09-05

## 2017-09-05 MED FILL — GABAPENTIN/100MG/CAPS: GABAPENTIN/100MG/CAPS | 12 days supply | Qty: 12 | Fill #0

## 2017-09-05 MED FILL — FAMOTIDINE/20MG/TABS: FAMOTIDINE/20MG/TABS | 30 days supply | Qty: 30 | Fill #0

## 2017-09-05 MED FILL — MG PLUS PROTEIN 133 MG/133 MG/TABS: MG PLUS PROTEIN 133 MG/133 MG/TABS | 50 days supply | Qty: 100 | Fill #0

## 2017-09-05 MED FILL — TRAMADOL HCL/50MG/TABS: TRAMADOL HCL/50MG/TABS | 7 days supply | Qty: 30 | Fill #0

## 2017-09-05 MED FILL — POLYETHYLENE GLYCOL 3350/3350 NF/POWD: POLYETHYLENE GLYCOL 3350/3350 NF/POWD | 30 days supply | Qty: 510 | Fill #0

## 2017-09-05 MED FILL — GNP ASPIRIN LOW DOSE/81MG EC/TBEC: GNP ASPIRIN LOW DOSE/81MG EC/TBEC | 30 days supply | Qty: 30 | Fill #0

## 2017-09-05 MED FILL — DOCUSATE/100MG/CAPS: DOCUSATE/100MG/CAPS | 30 days supply | Qty: 60 | Fill #0

## 2017-09-05 MED FILL — ACETAMINOPHEN/500MG/TABS: ACETAMINOPHEN/500MG/TABS | 16 days supply | Qty: 100 | Fill #0

## 2017-09-06 MED ORDER — SULFAMETHOXAZOLE 400 MG-TRIMETHOPRIM 80 MG TABLET
ORAL_TABLET | ORAL | 5 refills | 0.00000 days | Status: CP
Start: 2017-09-06 — End: 2017-10-08

## 2017-09-06 MED ORDER — FAMOTIDINE 20 MG TABLET
ORAL_TABLET | Freq: Every day | ORAL | 0 refills | 0 days | Status: CP
Start: 2017-09-06 — End: 2017-10-08

## 2017-09-06 MED ORDER — SULFAMETHOXAZOLE 400 MG-TRIMETHOPRIM 80 MG TABLET: tablet | 5 refills | 0 days

## 2017-09-06 MED ORDER — PROGRAF 1 MG CAPSULE: capsule | 19 refills | 0 days

## 2017-09-06 MED ORDER — PROGRAF 1 MG CAPSULE
ORAL_CAPSULE | Freq: Two times a day (BID) | ORAL | 19 refills | 0.00000 days | Status: CP
Start: 2017-09-06 — End: 2017-09-06

## 2017-09-06 MED ORDER — SULFAMETHOXAZOLE 400 MG-TRIMETHOPRIM 80 MG TABLET: 1 | tablet | 5 refills | 0 days | Status: AC

## 2017-09-06 MED FILL — PROGRAF/1MG/CAP: PROGRAF/1MG/CAP | 30 days supply | Qty: 180 | Fill #0

## 2017-09-09 MED ORDER — PROGRAF 1 MG CAPSULE: capsule | 11 refills | 0 days

## 2017-09-09 MED ORDER — PROGRAF 1 MG CAPSULE
ORAL_CAPSULE | Freq: Two times a day (BID) | ORAL | 11 refills | 0.00000 days | Status: CP
Start: 2017-09-09 — End: 2017-09-23

## 2017-09-11 ENCOUNTER — Other Ambulatory Visit
Admission: RE | Admit: 2017-09-11 | Discharge: 2017-09-11 | Disposition: A | Discharge status: Home / Self Care | Payer: Medicare Other | Source: Ambulatory Visit | Attending: Nephrology | Admitting: Nephrology

## 2017-09-11 DIAGNOSIS — E1129 Type 2 diabetes mellitus with other diabetic kidney complication: Secondary | ICD-10-CM | POA: Diagnosis not present

## 2017-09-11 DIAGNOSIS — Z79899 Other long term (current) drug therapy: Secondary | ICD-10-CM | POA: Diagnosis not present

## 2017-09-11 DIAGNOSIS — N189 Chronic kidney disease, unspecified: Secondary | ICD-10-CM | POA: Insufficient documentation

## 2017-09-11 DIAGNOSIS — D899 Disorder involving the immune mechanism, unspecified: Secondary | ICD-10-CM | POA: Insufficient documentation

## 2017-09-11 DIAGNOSIS — E559 Vitamin D deficiency, unspecified: Secondary | ICD-10-CM | POA: Diagnosis not present

## 2017-09-11 DIAGNOSIS — Z9483 Pancreas transplant status: Secondary | ICD-10-CM | POA: Insufficient documentation

## 2017-09-11 DIAGNOSIS — D631 Anemia in chronic kidney disease: Secondary | ICD-10-CM | POA: Insufficient documentation

## 2017-09-11 DIAGNOSIS — Z940 Kidney transplant status: Secondary | ICD-10-CM | POA: Diagnosis present

## 2017-09-11 DIAGNOSIS — B259 Cytomegaloviral disease, unspecified: Secondary | ICD-10-CM | POA: Diagnosis not present

## 2017-09-11 LAB — CBC WITH DIFFERENTIAL/PLATELET
BASOS ABS: 0 10*3/uL (ref 0–0.1)
Basophils Relative: 0 %
EOS ABS: 0 10*3/uL (ref 0–0.7)
Eosinophils Relative: 0 %
HEMATOCRIT: 27.6 % — AB (ref 40.0–52.0)
Hemoglobin: 9.2 g/dL — ABNORMAL LOW (ref 13.0–18.0)
LYMPHS ABS: 0 10*3/uL — AB (ref 1.0–3.6)
Lymphocytes Relative: 0 %
MCH: 31.6 pg (ref 26.0–34.0)
MCHC: 33.5 g/dL (ref 32.0–36.0)
MCV: 94.3 fL (ref 80.0–100.0)
MONO ABS: 0.2 10*3/uL (ref 0.2–1.0)
MONOS PCT: 2 %
NEUTROS ABS: 9.5 10*3/uL — AB (ref 1.4–6.5)
Neutrophils Relative %: 98 %
PLATELETS: 144 10*3/uL — AB (ref 150–440)
RBC: 2.92 MIL/uL — AB (ref 4.40–5.90)
RDW: 15.3 % — AB (ref 11.5–14.5)
Smear Review: ADEQUATE
WBC: 9.7 10*3/uL (ref 3.8–10.6)

## 2017-09-11 LAB — BASIC METABOLIC PANEL
ANION GAP: 14 (ref 5–15)
BUN: 119 mg/dL — ABNORMAL HIGH (ref 6–20)
CO2: 16 mmol/L — ABNORMAL LOW (ref 22–32)
CREATININE: 7.22 mg/dL — AB (ref 0.61–1.24)
Calcium: 8.8 mg/dL — ABNORMAL LOW (ref 8.9–10.3)
Chloride: 103 mmol/L (ref 101–111)
GFR calc Af Amer: 8 mL/min — ABNORMAL LOW (ref >60)
GFR, EST NON AFRICAN AMERICAN: 7 mL/min — AB (ref >60)
Glucose, Bld: 123 mg/dL — ABNORMAL HIGH (ref 65–99)
Potassium: 5.9 mmol/L — ABNORMAL HIGH (ref 3.5–5.1)
SODIUM: 133 mmol/L — AB (ref 135–145)

## 2017-09-11 LAB — MAGNESIUM: Magnesium: 2 mg/dL (ref 1.7–2.4)

## 2017-09-11 LAB — PHOSPHORUS: PHOSPHORUS: 6.6 mg/dL — AB (ref 2.5–4.6)

## 2017-09-12 ENCOUNTER — Institutional Professional Consult (permissible substitution)
Admit: 2017-09-12 | Discharge: 2017-09-12 | Payer: MEDICARE | Attending: Pharmacist Clinician (PhC)/ Clinical Pharmacy Specialist | Primary: Pharmacist Clinician (PhC)/ Clinical Pharmacy Specialist

## 2017-09-12 ENCOUNTER — Ambulatory Visit: Admit: 2017-09-12 | Discharge: 2017-09-12 | Payer: MEDICARE | Attending: Surgery | Primary: Surgery

## 2017-09-12 ENCOUNTER — Ambulatory Visit: Admit: 2017-09-12 | Discharge: 2017-09-12 | Payer: MEDICARE

## 2017-09-12 DIAGNOSIS — Z4822 Encounter for aftercare following kidney transplant: Principal | ICD-10-CM

## 2017-09-12 DIAGNOSIS — Z940 Kidney transplant status: Principal | ICD-10-CM

## 2017-09-12 DIAGNOSIS — N179 Acute kidney failure, unspecified: Principal | ICD-10-CM

## 2017-09-13 ENCOUNTER — Ambulatory Visit: Admit: 2017-09-13 | Discharge: 2017-09-13 | Payer: MEDICARE

## 2017-09-13 LAB — TACROLIMUS LEVEL: Tacrolimus (FK506) - LabCorp: 6.9 ng/mL (ref 2.0–20.0)

## 2017-09-13 MED ORDER — SODIUM BICARBONATE 650 MG TABLET: 650 mg | tablet | Freq: Three times a day (TID) | 0 refills | 0 days | Status: AC

## 2017-09-13 MED ORDER — SODIUM BICARBONATE 650 MG TABLET
ORAL_TABLET | Freq: Three times a day (TID) | ORAL | 0 refills | 0.00000 days | Status: CP
Start: 2017-09-13 — End: 2017-11-04

## 2017-09-13 MED FILL — SODIUM BICARBONATE/650MG/TABS: SODIUM BICARBONATE/650MG/TABS | 33 days supply | Qty: 100 | Fill #0

## 2017-09-16 ENCOUNTER — Encounter
Admission: RE | Admit: 2017-09-16 | Discharge: 2017-09-16 | Disposition: A | Discharge status: Home / Self Care | Payer: Medicare Other | Source: Ambulatory Visit | Attending: Nephrology | Admitting: Nephrology

## 2017-09-16 DIAGNOSIS — D899 Disorder involving the immune mechanism, unspecified: Secondary | ICD-10-CM | POA: Diagnosis present

## 2017-09-16 DIAGNOSIS — Z940 Kidney transplant status: Secondary | ICD-10-CM | POA: Insufficient documentation

## 2017-09-16 DIAGNOSIS — X58XXXA Exposure to other specified factors, initial encounter: Secondary | ICD-10-CM | POA: Diagnosis not present

## 2017-09-16 DIAGNOSIS — N390 Urinary tract infection, site not specified: Secondary | ICD-10-CM | POA: Diagnosis not present

## 2017-09-16 DIAGNOSIS — D631 Anemia in chronic kidney disease: Secondary | ICD-10-CM | POA: Diagnosis not present

## 2017-09-16 DIAGNOSIS — T8610 Unspecified complication of kidney transplant: Secondary | ICD-10-CM | POA: Insufficient documentation

## 2017-09-16 DIAGNOSIS — E1129 Type 2 diabetes mellitus with other diabetic kidney complication: Secondary | ICD-10-CM | POA: Diagnosis not present

## 2017-09-16 DIAGNOSIS — Z79899 Other long term (current) drug therapy: Secondary | ICD-10-CM | POA: Insufficient documentation

## 2017-09-16 DIAGNOSIS — Z789 Other specified health status: Secondary | ICD-10-CM | POA: Insufficient documentation

## 2017-09-16 DIAGNOSIS — Z114 Encounter for screening for human immunodeficiency virus [HIV]: Secondary | ICD-10-CM | POA: Insufficient documentation

## 2017-09-16 DIAGNOSIS — Z9483 Pancreas transplant status: Secondary | ICD-10-CM | POA: Insufficient documentation

## 2017-09-16 DIAGNOSIS — B259 Cytomegaloviral disease, unspecified: Secondary | ICD-10-CM | POA: Insufficient documentation

## 2017-09-16 LAB — CBC WITH DIFFERENTIAL/PLATELET
Basophils Absolute: 0 10*3/uL (ref 0–0.1)
Basophils Relative: 0 %
EOS ABS: 0 10*3/uL (ref 0–0.7)
EOS PCT: 0 %
HCT: 27.3 % — ABNORMAL LOW (ref 40.0–52.0)
Hemoglobin: 9 g/dL — ABNORMAL LOW (ref 13.0–18.0)
LYMPHS ABS: 0 10*3/uL — AB (ref 1.0–3.6)
LYMPHS PCT: 0 %
MCH: 31.6 pg (ref 26.0–34.0)
MCHC: 33 g/dL (ref 32.0–36.0)
MCV: 95.9 fL (ref 80.0–100.0)
MONO ABS: 0.3 10*3/uL (ref 0.2–1.0)
MONOS PCT: 3 %
Neutro Abs: 12.1 10*3/uL — ABNORMAL HIGH (ref 1.4–6.5)
Neutrophils Relative %: 97 %
PLATELETS: 362 10*3/uL (ref 150–440)
RBC: 2.85 MIL/uL — ABNORMAL LOW (ref 4.40–5.90)
RDW: 15.5 % — AB (ref 11.5–14.5)
WBC: 12.5 10*3/uL — AB (ref 3.8–10.6)

## 2017-09-16 LAB — BASIC METABOLIC PANEL
ANION GAP: 12 (ref 5–15)
BUN: 77 mg/dL — AB (ref 6–20)
CALCIUM: 9.4 mg/dL (ref 8.9–10.3)
CO2: 19 mmol/L — ABNORMAL LOW (ref 22–32)
CREATININE: 5.76 mg/dL — AB (ref 0.61–1.24)
Chloride: 104 mmol/L (ref 101–111)
GFR calc Af Amer: 11 mL/min — ABNORMAL LOW (ref >60)
GFR, EST NON AFRICAN AMERICAN: 10 mL/min — AB (ref >60)
GLUCOSE: 139 mg/dL — AB (ref 65–99)
Potassium: 5.5 mmol/L — ABNORMAL HIGH (ref 3.5–5.1)
Sodium: 135 mmol/L (ref 135–145)

## 2017-09-16 LAB — MAGNESIUM: Magnesium: 1.8 mg/dL (ref 1.7–2.4)

## 2017-09-16 LAB — PHOSPHORUS: Phosphorus: 4.8 mg/dL — ABNORMAL HIGH (ref 2.5–4.6)

## 2017-09-17 LAB — TACROLIMUS LEVEL: Tacrolimus (FK506) - LabCorp: 11.8 ng/mL (ref 2.0–20.0)

## 2017-09-18 ENCOUNTER — Other Ambulatory Visit
Admission: RE | Admit: 2017-09-18 | Discharge: 2017-09-18 | Disposition: A | Discharge status: Home / Self Care | Payer: Medicare Other | Source: Ambulatory Visit | Attending: Nephrology | Admitting: Nephrology

## 2017-09-18 DIAGNOSIS — D899 Disorder involving the immune mechanism, unspecified: Secondary | ICD-10-CM | POA: Insufficient documentation

## 2017-09-18 DIAGNOSIS — Z79899 Other long term (current) drug therapy: Secondary | ICD-10-CM | POA: Insufficient documentation

## 2017-09-18 DIAGNOSIS — Z940 Kidney transplant status: Secondary | ICD-10-CM | POA: Diagnosis present

## 2017-09-18 DIAGNOSIS — N390 Urinary tract infection, site not specified: Secondary | ICD-10-CM | POA: Insufficient documentation

## 2017-09-18 DIAGNOSIS — E1129 Type 2 diabetes mellitus with other diabetic kidney complication: Secondary | ICD-10-CM | POA: Diagnosis not present

## 2017-09-18 DIAGNOSIS — Z09 Encounter for follow-up examination after completed treatment for conditions other than malignant neoplasm: Secondary | ICD-10-CM | POA: Diagnosis present

## 2017-09-18 DIAGNOSIS — Z789 Other specified health status: Secondary | ICD-10-CM | POA: Insufficient documentation

## 2017-09-18 DIAGNOSIS — D631 Anemia in chronic kidney disease: Secondary | ICD-10-CM | POA: Diagnosis not present

## 2017-09-18 DIAGNOSIS — Z9483 Pancreas transplant status: Secondary | ICD-10-CM | POA: Diagnosis not present

## 2017-09-18 DIAGNOSIS — Z114 Encounter for screening for human immunodeficiency virus [HIV]: Secondary | ICD-10-CM | POA: Diagnosis not present

## 2017-09-18 DIAGNOSIS — E559 Vitamin D deficiency, unspecified: Secondary | ICD-10-CM | POA: Insufficient documentation

## 2017-09-18 DIAGNOSIS — B269 Mumps without complication: Secondary | ICD-10-CM | POA: Diagnosis not present

## 2017-09-18 LAB — CBC WITH DIFFERENTIAL/PLATELET
Basophils Absolute: 0 10*3/uL (ref 0–0.1)
Basophils Relative: 0 %
EOS PCT: 0 %
Eosinophils Absolute: 0 10*3/uL (ref 0–0.7)
HEMATOCRIT: 27.4 % — AB (ref 40.0–52.0)
Hemoglobin: 8.9 g/dL — ABNORMAL LOW (ref 13.0–18.0)
LYMPHS ABS: 0.1 10*3/uL — AB (ref 1.0–3.6)
LYMPHS PCT: 1 %
MCH: 31.4 pg (ref 26.0–34.0)
MCHC: 32.6 g/dL (ref 32.0–36.0)
MCV: 96.4 fL (ref 80.0–100.0)
MONO ABS: 0.3 10*3/uL (ref 0.2–1.0)
Monocytes Relative: 2 %
Neutro Abs: 12.4 10*3/uL — ABNORMAL HIGH (ref 1.4–6.5)
Neutrophils Relative %: 97 %
PLATELETS: 388 10*3/uL (ref 150–440)
RBC: 2.85 MIL/uL — AB (ref 4.40–5.90)
RDW: 15.4 % — ABNORMAL HIGH (ref 11.5–14.5)
WBC: 12.9 10*3/uL — AB (ref 3.8–10.6)

## 2017-09-18 LAB — BASIC METABOLIC PANEL
Anion gap: 11 (ref 5–15)
BUN: 61 mg/dL — ABNORMAL HIGH (ref 6–20)
CALCIUM: 9.6 mg/dL (ref 8.9–10.3)
CO2: 20 mmol/L — AB (ref 22–32)
CREATININE: 4.9 mg/dL — AB (ref 0.61–1.24)
Chloride: 105 mmol/L (ref 101–111)
GFR calc Af Amer: 14 mL/min — ABNORMAL LOW (ref >60)
GFR calc non Af Amer: 12 mL/min — ABNORMAL LOW (ref >60)
GLUCOSE: 140 mg/dL — AB (ref 65–99)
Potassium: 5.8 mmol/L — ABNORMAL HIGH (ref 3.5–5.1)
Sodium: 136 mmol/L (ref 135–145)

## 2017-09-18 LAB — MAGNESIUM: Magnesium: 1.8 mg/dL (ref 1.7–2.4)

## 2017-09-18 LAB — PHOSPHORUS: Phosphorus: 4.2 mg/dL (ref 2.5–4.6)

## 2017-09-20 ENCOUNTER — Other Ambulatory Visit
Admission: RE | Admit: 2017-09-20 | Discharge: 2017-09-20 | Disposition: A | Discharge status: Home / Self Care | Payer: Medicare Other | Source: Ambulatory Visit | Attending: Nephrology | Admitting: Nephrology

## 2017-09-20 DIAGNOSIS — B269 Mumps without complication: Secondary | ICD-10-CM | POA: Insufficient documentation

## 2017-09-20 DIAGNOSIS — E1129 Type 2 diabetes mellitus with other diabetic kidney complication: Secondary | ICD-10-CM | POA: Insufficient documentation

## 2017-09-20 DIAGNOSIS — E559 Vitamin D deficiency, unspecified: Secondary | ICD-10-CM | POA: Insufficient documentation

## 2017-09-20 DIAGNOSIS — Z940 Kidney transplant status: Secondary | ICD-10-CM | POA: Insufficient documentation

## 2017-09-20 DIAGNOSIS — Z09 Encounter for follow-up examination after completed treatment for conditions other than malignant neoplasm: Secondary | ICD-10-CM | POA: Diagnosis present

## 2017-09-20 DIAGNOSIS — D631 Anemia in chronic kidney disease: Secondary | ICD-10-CM | POA: Insufficient documentation

## 2017-09-20 DIAGNOSIS — Z79899 Other long term (current) drug therapy: Secondary | ICD-10-CM | POA: Insufficient documentation

## 2017-09-20 DIAGNOSIS — N390 Urinary tract infection, site not specified: Secondary | ICD-10-CM | POA: Diagnosis not present

## 2017-09-20 DIAGNOSIS — Z789 Other specified health status: Secondary | ICD-10-CM | POA: Diagnosis not present

## 2017-09-20 DIAGNOSIS — D899 Disorder involving the immune mechanism, unspecified: Secondary | ICD-10-CM | POA: Diagnosis not present

## 2017-09-20 DIAGNOSIS — Z9483 Pancreas transplant status: Secondary | ICD-10-CM | POA: Insufficient documentation

## 2017-09-20 DIAGNOSIS — Z114 Encounter for screening for human immunodeficiency virus [HIV]: Secondary | ICD-10-CM | POA: Diagnosis not present

## 2017-09-20 LAB — CBC WITH DIFFERENTIAL/PLATELET
Basophils Absolute: 0.2 10*3/uL — ABNORMAL HIGH (ref 0–0.1)
Basophils Relative: 2 %
EOS PCT: 0 %
Eosinophils Absolute: 0 10*3/uL (ref 0–0.7)
HCT: 25.6 % — ABNORMAL LOW (ref 40.0–52.0)
HEMOGLOBIN: 8.6 g/dL — AB (ref 13.0–18.0)
LYMPHS PCT: 1 %
Lymphs Abs: 0.1 10*3/uL — ABNORMAL LOW (ref 1.0–3.6)
MCH: 32.2 pg (ref 26.0–34.0)
MCHC: 33.5 g/dL (ref 32.0–36.0)
MCV: 96.1 fL (ref 80.0–100.0)
Monocytes Absolute: 0.2 10*3/uL (ref 0.2–1.0)
Monocytes Relative: 3 %
NEUTROS PCT: 94 %
Neutro Abs: 7.4 10*3/uL — ABNORMAL HIGH (ref 1.4–6.5)
PLATELETS: 356 10*3/uL (ref 150–440)
RBC: 2.66 MIL/uL — AB (ref 4.40–5.90)
RDW: 14.5 % (ref 11.5–14.5)
WBC: 7.9 10*3/uL (ref 3.8–10.6)

## 2017-09-20 LAB — MAGNESIUM: Magnesium: 1.6 mg/dL — ABNORMAL LOW (ref 1.7–2.4)

## 2017-09-20 LAB — BASIC METABOLIC PANEL
ANION GAP: 11 (ref 5–15)
BUN: 54 mg/dL — ABNORMAL HIGH (ref 6–20)
CALCIUM: 9.7 mg/dL (ref 8.9–10.3)
CHLORIDE: 100 mmol/L — AB (ref 101–111)
CO2: 23 mmol/L (ref 22–32)
Creatinine, Ser: 4.23 mg/dL — ABNORMAL HIGH (ref 0.61–1.24)
GFR calc non Af Amer: 14 mL/min — ABNORMAL LOW (ref >60)
GFR, EST AFRICAN AMERICAN: 16 mL/min — AB (ref >60)
Glucose, Bld: 120 mg/dL — ABNORMAL HIGH (ref 65–99)
POTASSIUM: 5.7 mmol/L — AB (ref 3.5–5.1)
Sodium: 134 mmol/L — ABNORMAL LOW (ref 135–145)

## 2017-09-20 LAB — PHOSPHORUS: Phosphorus: 4.3 mg/dL (ref 2.5–4.6)

## 2017-09-20 LAB — TACROLIMUS LEVEL: Tacrolimus (FK506) - LabCorp: 9.8 ng/mL (ref 2.0–20.0)

## 2017-09-22 LAB — TACROLIMUS LEVEL: Tacrolimus (FK506) - LabCorp: 17.6 ng/mL (ref 2.0–20.0)

## 2017-09-23 ENCOUNTER — Other Ambulatory Visit
Admission: RE | Admit: 2017-09-23 | Discharge: 2017-09-23 | Disposition: A | Discharge status: Home / Self Care | Payer: Medicare Other | Source: Ambulatory Visit | Attending: Nephrology | Admitting: Nephrology

## 2017-09-23 DIAGNOSIS — D631 Anemia in chronic kidney disease: Secondary | ICD-10-CM | POA: Insufficient documentation

## 2017-09-23 DIAGNOSIS — Z79899 Other long term (current) drug therapy: Secondary | ICD-10-CM | POA: Diagnosis present

## 2017-09-23 DIAGNOSIS — E559 Vitamin D deficiency, unspecified: Secondary | ICD-10-CM | POA: Insufficient documentation

## 2017-09-23 DIAGNOSIS — Z899 Acquired absence of limb, unspecified: Secondary | ICD-10-CM | POA: Insufficient documentation

## 2017-09-23 DIAGNOSIS — D899 Disorder involving the immune mechanism, unspecified: Secondary | ICD-10-CM | POA: Insufficient documentation

## 2017-09-23 DIAGNOSIS — Z09 Encounter for follow-up examination after completed treatment for conditions other than malignant neoplasm: Secondary | ICD-10-CM | POA: Diagnosis present

## 2017-09-23 DIAGNOSIS — N390 Urinary tract infection, site not specified: Secondary | ICD-10-CM | POA: Insufficient documentation

## 2017-09-23 DIAGNOSIS — B259 Cytomegaloviral disease, unspecified: Secondary | ICD-10-CM | POA: Diagnosis not present

## 2017-09-23 DIAGNOSIS — E1129 Type 2 diabetes mellitus with other diabetic kidney complication: Secondary | ICD-10-CM | POA: Insufficient documentation

## 2017-09-23 DIAGNOSIS — Z789 Other specified health status: Secondary | ICD-10-CM | POA: Insufficient documentation

## 2017-09-23 DIAGNOSIS — Z114 Encounter for screening for human immunodeficiency virus [HIV]: Secondary | ICD-10-CM | POA: Diagnosis not present

## 2017-09-23 DIAGNOSIS — Z9483 Pancreas transplant status: Secondary | ICD-10-CM | POA: Insufficient documentation

## 2017-09-23 LAB — CBC WITH DIFFERENTIAL/PLATELET
Basophils Absolute: 0.1 10*3/uL (ref 0–0.1)
Basophils Relative: 1 %
EOS ABS: 0 10*3/uL (ref 0–0.7)
EOS PCT: 0 %
HCT: 26.7 % — ABNORMAL LOW (ref 40.0–52.0)
Hemoglobin: 8.9 g/dL — ABNORMAL LOW (ref 13.0–18.0)
LYMPHS ABS: 0.1 10*3/uL — AB (ref 1.0–3.6)
Lymphocytes Relative: 1 %
MCH: 31.4 pg (ref 26.0–34.0)
MCHC: 33.3 g/dL (ref 32.0–36.0)
MCV: 94.6 fL (ref 80.0–100.0)
Monocytes Absolute: 0.2 10*3/uL (ref 0.2–1.0)
Monocytes Relative: 3 %
Neutro Abs: 5.4 10*3/uL (ref 1.4–6.5)
Neutrophils Relative %: 95 %
PLATELETS: 372 10*3/uL (ref 150–440)
RBC: 2.82 MIL/uL — AB (ref 4.40–5.90)
RDW: 14.6 % — ABNORMAL HIGH (ref 11.5–14.5)
WBC: 5.7 10*3/uL (ref 3.8–10.6)

## 2017-09-23 LAB — BASIC METABOLIC PANEL
Anion gap: 10 (ref 5–15)
BLOOD UREA NITROGEN: 45 mg/dL — ABNORMAL HIGH
BUN: 46 mg/dL — ABNORMAL HIGH (ref 6–20)
CALCIUM: 9.6 mg/dL (ref 8.9–10.3)
CALCIUM: 9.6 mg/dL
CO2: 22 mmol/L (ref 22–32)
CREATININE: 3.51 mg/dL — AB (ref 0.61–1.24)
CREATININE: 3.51 mg/dL — ABNORMAL HIGH
Chloride: 101 mmol/L (ref 101–111)
EGFR MDRD NON AF AMER: 17 mL/min/{1.73_m2} — ABNORMAL LOW
GFR calc non Af Amer: 17 mL/min — ABNORMAL LOW (ref >60)
GFR, EST AFRICAN AMERICAN: 20 mL/min — AB (ref >60)
GLUCOSE RANDOM: 125 mg/dL — ABNORMAL HIGH
Glucose, Bld: 125 mg/dL — ABNORMAL HIGH (ref 65–99)
POTASSIUM: 5.4 mmol/L — ABNORMAL HIGH
Potassium: 5.4 mmol/L — ABNORMAL HIGH (ref 3.5–5.1)
SODIUM: 133 mmol/L — AB (ref 135–145)
SODIUM: 133 mmol/L — ABNORMAL LOW

## 2017-09-23 LAB — PHOSPHORUS
Lab: 3.9
Lab: 4.3
PHOSPHORUS: 3.9 mg/dL (ref 2.5–4.6)

## 2017-09-23 LAB — MAGNESIUM
Lab: 1.6 — ABNORMAL LOW
Lab: 1.6 — ABNORMAL LOW
Magnesium: 1.6 mg/dL — ABNORMAL LOW (ref 1.7–2.4)

## 2017-09-23 LAB — CBC W/ DIFFERENTIAL
BASOPHILS ABSOLUTE COUNT: 0.2 10*9/L
BASOPHILS ABSOLUTE COUNT: 0.1 10*9/L
BASOPHILS RELATIVE PERCENT: 2 %
BASOPHILS RELATIVE PERCENT: 1 %
EOSINOPHILS ABSOLUTE COUNT: 0 10*9/L
EOSINOPHILS ABSOLUTE COUNT: 0 10*9/L
EOSINOPHILS RELATIVE PERCENT: 0 %
HEMATOCRIT: 25.6 % — ABNORMAL LOW
HEMATOCRIT: 26.7 % — ABNORMAL LOW
HEMOGLOBIN: 8.6 g/dL — ABNORMAL LOW
HEMOGLOBIN: 8.9 g/dL — ABNORMAL LOW
LYMPHOCYTES ABSOLUTE COUNT: 0.1 10*9/L — ABNORMAL LOW
LYMPHOCYTES ABSOLUTE COUNT: 0.1 10*9/L — ABNORMAL LOW
LYMPHOCYTES RELATIVE PERCENT: 1 %
LYMPHOCYTES RELATIVE PERCENT: 1 %
MEAN CORPUSCULAR HEMOGLOBIN CONC: 33.5 g/dL
MEAN CORPUSCULAR HEMOGLOBIN CONC: 33.3 g/dL
MEAN CORPUSCULAR HEMOGLOBIN: 32.2 pg
MEAN CORPUSCULAR HEMOGLOBIN: 31.4 pg
MEAN CORPUSCULAR VOLUME: 94.6 fL
MONOCYTES ABSOLUTE COUNT: 0.2 10*9/L
MONOCYTES ABSOLUTE COUNT: 0.2 10*9/L
MONOCYTES RELATIVE PERCENT: 3 %
MONOCYTES RELATIVE PERCENT: 3 %
NEUTROPHILS ABSOLUTE COUNT: 7.4 10*9/L — ABNORMAL HIGH
NEUTROPHILS ABSOLUTE COUNT: 5.4 10*9/L
NEUTROPHILS RELATIVE PERCENT: 94 %
NEUTROPHILS RELATIVE PERCENT: 95 %
PLATELET COUNT: 356 10*9/L
PLATELET COUNT: 372 10*9/L
RED BLOOD CELL COUNT: 2.66 10*12/L — ABNORMAL LOW
RED BLOOD CELL COUNT: 2.82 10*12/L — ABNORMAL LOW
RED CELL DISTRIBUTION WIDTH: 14.5 %
RED CELL DISTRIBUTION WIDTH: 14.6 % — ABNORMAL HIGH
WBC ADJUSTED: 7.9 10*9/L
WHITE BLOOD CELL COUNT: 5.7 10*9/L

## 2017-09-23 LAB — COMPREHENSIVE METABOLIC PANEL
BLOOD UREA NITROGEN: 54 mg/dL — ABNORMAL HIGH
CALCIUM: 9.7 mg/dL
CO2: 23 mmol/L
CREATININE: 4.23 mg/dL — ABNORMAL HIGH
EGFR MDRD AF AMER: 16 mL/min/{1.73_m2} — ABNORMAL LOW
GLUCOSE RANDOM: 120 mg/dL — ABNORMAL HIGH
POTASSIUM: 5.7 mmol/L — ABNORMAL HIGH
SODIUM: 134 mmol/L — ABNORMAL LOW

## 2017-09-23 LAB — TACROLIMUS, TROUGH: Lab: 17.6

## 2017-09-23 LAB — PROTEIN TOTAL: Lab: 0

## 2017-09-23 LAB — WHITE BLOOD CELL COUNT: Lab: 5.7

## 2017-09-23 LAB — LYMPHOCYTES ABSOLUTE COUNT: Lab: 0.1 — ABNORMAL LOW

## 2017-09-23 LAB — GLUCOSE RANDOM: Lab: 125 — ABNORMAL HIGH

## 2017-09-23 MED ORDER — MYFORTIC 180 MG TABLET,DELAYED RELEASE
ORAL_TABLET | Freq: Two times a day (BID) | ORAL | 11 refills | 0.00000 days | Status: CP
Start: 2017-09-23 — End: 2017-09-23

## 2017-09-23 MED ORDER — PROGRAF 1 MG CAPSULE: 5 mg | capsule | Freq: Two times a day (BID) | 11 refills | 0 days | Status: AC

## 2017-09-23 MED ORDER — PROGRAF 1 MG CAPSULE
ORAL_CAPSULE | Freq: Two times a day (BID) | ORAL | 11 refills | 0.00000 days | Status: CP
Start: 2017-09-23 — End: 2017-09-25

## 2017-09-23 MED ORDER — MYFORTIC 180 MG TABLET,DELAYED RELEASE: 540 mg | tablet | Freq: Two times a day (BID) | 11 refills | 0 days | Status: AC

## 2017-09-23 MED ORDER — MYFORTIC 180 MG TABLET,DELAYED RELEASE: tablet | 11 refills | 0 days

## 2017-09-23 NOTE — Unmapped (Signed)
See the Tourist information centre manager for documentation.  Justin Mcknight

## 2017-09-23 NOTE — Unmapped (Signed)
Spoke with pt by phone today, utilizing Research officer, trade union.  The following were discussed:    Medication refill:  Pt has enough prograf until Thursday, and wasn't sure where to get refills.  He has opted to use [facility] pharmacy, and was instructed that someone from the pharmacy would call him and on-board.    Potassium level:  Potassium level has been consistently high.  Review of food intake reveals potatoes are the only potassium rich food he is eating.  He understands to refrain from eating them, and seems to have a good understanding of potassium rich foods to avoid.  Explained to him that prograf may be having an impact on his levels.    Pt reports good urine output and wound is dry with intact staples.  He was instructed how to call if he has any issues.  Of note, today's labs are pending at this writing.

## 2017-09-23 NOTE — Unmapped (Signed)
[facility] Specialty Medication Referral: No PA required    Medication (Brand/Generic): Prograf 1mg     Initial FSI Test Claim completed with resulted information below:  No PA required  Patient ABLE to fill at [facility]  Insurance Company:  ALL then MEDICAID  Anticipated Copay: $0  *Myfortic = refill too soon    As Co-pay is under $100 defined limit, per policy there will be no further investigation of need for financial assistance at this time unless patient requests. This referral has been communicated to the provider and handed off to the [facility] team for further processing and filling of prescribed medication.   ______________________________________________________________________  Please utilize this referral for viewing purposes as it will serve as the central location for all relevant documentation and updates.

## 2017-09-23 NOTE — Unmapped (Signed)
See the Language Assistant Navigator for documentation.  Justin Mcknight

## 2017-09-24 LAB — TACROLIMUS LEVEL: Tacrolimus (FK506) - LabCorp: 10 ng/mL (ref 2.0–20.0)

## 2017-09-24 NOTE — Unmapped (Signed)
[facility]   Patient Onboarding/Medication Counseling    [name] is a 62 y.o. male with hx kidney transplant who I am counseling today on initiation of therapy. Spoke with patient today via translator - he declined education on meds today, says was counseled extensively at clinic and as inpatient.     Medication: prograf 1mg , myfortic 180mg , valganciclovir 450mg     Verified patient's date of birth / HIPAA.      Education Provided: ??    Dose/Administration discussed: patient declined education.     Storage requirements: patient declined education.    Side effects / precautions discussed: patient declined education.  Patient will receive a drug information handout with shipment.    Handling precautions / disposal reviewed:  n/a.    Drug Interactions: other medications reviewed and up to date in Epic.  No drug interactions identified. patient declined education.    Comorbidities/Allergies: reviewed and up to date in Epic.    Verified therapy is appropriate and should continue      Delivery Information    Medication Assistance provided: none    Anticipated copay of $0 on myfortic and prograf, $3.40 on valganciclovir reviewed with patient. Verified delivery address in FSI and reviewed medication storage requirement.    Scheduled delivery date: 3/21 for prograf only since dose inc, requests call back next week for myfortic and valganciclovir and other meds    Explained that we ship using UPS or courier and this shipment will not require a signature.      Explained the services we provide at [facility] and that each month we would call to set up refills.  Stressed importance of returning phone calls so that we could ensure they receive their medications in time each month.  Informed patient that we should be setting up refills 7-10 days prior to when they will run out of medication.  Informed patient that welcome packet will be sent.      Patient verbalized understanding of the above information as well as how to contact the pharmacy at [phone] option 4 with any questions/concerns.  The pharmacy is open Monday through Friday 8:30am-4:30pm.  A pharmacist is available 24/7 via pager to answer any clinical questions they may have.        Patient Specific Needs      ? Patient has no physical, cognitive, or cultural barriers.    ? Patient prefers to have medications discussed with  Patient     ? Patient is able to read and understand education materials at a high school level or above.    ? Patient's primary language is  Spanish           Justin Mcknight  [facility] Specialty Pharmacist

## 2017-09-25 ENCOUNTER — Other Ambulatory Visit
Admission: RE | Admit: 2017-09-25 | Discharge: 2017-09-25 | Disposition: A | Discharge status: Home / Self Care | Payer: Medicare Other | Source: Ambulatory Visit | Attending: Nephrology | Admitting: Nephrology

## 2017-09-25 DIAGNOSIS — Z09 Encounter for follow-up examination after completed treatment for conditions other than malignant neoplasm: Secondary | ICD-10-CM | POA: Diagnosis not present

## 2017-09-25 DIAGNOSIS — D899 Disorder involving the immune mechanism, unspecified: Secondary | ICD-10-CM | POA: Diagnosis present

## 2017-09-25 DIAGNOSIS — Z79899 Other long term (current) drug therapy: Secondary | ICD-10-CM | POA: Insufficient documentation

## 2017-09-25 DIAGNOSIS — Z789 Other specified health status: Secondary | ICD-10-CM | POA: Diagnosis not present

## 2017-09-25 DIAGNOSIS — N390 Urinary tract infection, site not specified: Secondary | ICD-10-CM | POA: Insufficient documentation

## 2017-09-25 DIAGNOSIS — T8610 Unspecified complication of kidney transplant: Secondary | ICD-10-CM | POA: Insufficient documentation

## 2017-09-25 DIAGNOSIS — E1129 Type 2 diabetes mellitus with other diabetic kidney complication: Secondary | ICD-10-CM | POA: Diagnosis not present

## 2017-09-25 DIAGNOSIS — B259 Cytomegaloviral disease, unspecified: Secondary | ICD-10-CM | POA: Diagnosis not present

## 2017-09-25 DIAGNOSIS — Z114 Encounter for screening for human immunodeficiency virus [HIV]: Secondary | ICD-10-CM | POA: Diagnosis not present

## 2017-09-25 DIAGNOSIS — D531 Other megaloblastic anemias, not elsewhere classified: Secondary | ICD-10-CM | POA: Diagnosis not present

## 2017-09-25 DIAGNOSIS — X58XXXA Exposure to other specified factors, initial encounter: Secondary | ICD-10-CM | POA: Insufficient documentation

## 2017-09-25 DIAGNOSIS — Z9483 Pancreas transplant status: Secondary | ICD-10-CM | POA: Insufficient documentation

## 2017-09-25 DIAGNOSIS — Z940 Kidney transplant status: Secondary | ICD-10-CM | POA: Insufficient documentation

## 2017-09-25 LAB — CBC WITH DIFFERENTIAL/PLATELET
Basophils Absolute: 0.1 10*3/uL (ref 0–0.1)
Basophils Relative: 2 %
EOS PCT: 0 %
Eosinophils Absolute: 0 10*3/uL (ref 0–0.7)
HCT: 27 % — ABNORMAL LOW (ref 40.0–52.0)
Hemoglobin: 9.1 g/dL — ABNORMAL LOW (ref 13.0–18.0)
LYMPHS ABS: 0 10*3/uL — AB (ref 1.0–3.6)
LYMPHS PCT: 1 %
MCH: 32 pg (ref 26.0–34.0)
MCHC: 33.6 g/dL (ref 32.0–36.0)
MCV: 95.1 fL (ref 80.0–100.0)
MONO ABS: 0.3 10*3/uL (ref 0.2–1.0)
Monocytes Relative: 5 %
Neutro Abs: 6.2 10*3/uL (ref 1.4–6.5)
Neutrophils Relative %: 92 %
Platelets: 372 10*3/uL (ref 150–440)
RBC: 2.84 MIL/uL — ABNORMAL LOW (ref 4.40–5.90)
RDW: 14.1 % (ref 11.5–14.5)
WBC: 6.6 10*3/uL (ref 3.8–10.6)

## 2017-09-25 LAB — PHOSPHORUS
Lab: 3.3
Phosphorus: 3.3 mg/dL (ref 2.5–4.6)

## 2017-09-25 LAB — BASIC METABOLIC PANEL WITH GFR
Anion gap: 10 (ref 5–15)
BUN: 45 mg/dL — ABNORMAL HIGH (ref 6–20)
CO2: 22 mmol/L (ref 22–32)
Calcium: 9.6 mg/dL (ref 8.9–10.3)
Chloride: 102 mmol/L (ref 101–111)
Creatinine, Ser: 3.58 mg/dL — ABNORMAL HIGH (ref 0.61–1.24)
GFR calc Af Amer: 20 mL/min — ABNORMAL LOW
GFR calc non Af Amer: 17 mL/min — ABNORMAL LOW
Glucose, Bld: 129 mg/dL — ABNORMAL HIGH (ref 65–99)
Potassium: 5.4 mmol/L — ABNORMAL HIGH (ref 3.5–5.1)
Sodium: 134 mmol/L — ABNORMAL LOW (ref 135–145)

## 2017-09-25 LAB — MAGNESIUM
Lab: 1.5 — ABNORMAL LOW
Magnesium: 1.5 mg/dL — ABNORMAL LOW (ref 1.7–2.4)

## 2017-09-25 LAB — CBC W/ DIFFERENTIAL
EOSINOPHILS ABSOLUTE COUNT: 0 10*9/L
EOSINOPHILS RELATIVE PERCENT: 0 %
HEMATOCRIT: 27 % — ABNORMAL LOW
HEMOGLOBIN: 9.1 g/dL — ABNORMAL LOW
LYMPHOCYTES ABSOLUTE COUNT: 0 10*9/L — ABNORMAL LOW
LYMPHOCYTES RELATIVE PERCENT: 1 %
MEAN CORPUSCULAR HEMOGLOBIN CONC: 33.6 g/dL
MEAN CORPUSCULAR HEMOGLOBIN: 32 pg
MEAN CORPUSCULAR VOLUME: 95.1 fL
MONOCYTES ABSOLUTE COUNT: 0.3 10*9/L
MONOCYTES RELATIVE PERCENT: 5 %
NEUTROPHILS ABSOLUTE COUNT: 6.2 10*9/L
PLATELET COUNT: 372 10*9/L
RED CELL DISTRIBUTION WIDTH: 14.1 %
WBC ADJUSTED: 6.6 10*9/L

## 2017-09-25 LAB — TACROLIMUS, TROUGH: Lab: 10

## 2017-09-25 LAB — PROTEIN TOTAL: Lab: 0

## 2017-09-25 LAB — COMPREHENSIVE METABOLIC PANEL
BLOOD UREA NITROGEN: 45 mg/dL — ABNORMAL HIGH
CALCIUM: 9.6 mg/dL
CHLORIDE: 102 mmol/L
CO2: 22 mmol/L
CREATININE: 3.58 mg/dL — ABNORMAL HIGH
EGFR MDRD AF AMER: 20 mL/min/{1.73_m2} — ABNORMAL LOW
EGFR MDRD NON AF AMER: 17 mL/min/{1.73_m2} — ABNORMAL LOW
GLUCOSE RANDOM: 129 mg/dL — ABNORMAL HIGH
POTASSIUM: 5.4 mmol/L — ABNORMAL HIGH

## 2017-09-25 LAB — HEMATOCRIT: Lab: 27 — ABNORMAL LOW

## 2017-09-25 MED ORDER — PROGRAF 1 MG CAPSULE
ORAL_CAPSULE | 11 refills | 0 days
Start: 2017-09-25 — End: 2017-09-30

## 2017-09-25 MED FILL — PROGRAF/1MG/CAP: PROGRAF/1MG/CAP | 30 days supply | Qty: 300 | Fill #0

## 2017-09-25 NOTE — Unmapped (Signed)
See the Language Assistant Navigator for documentation.  Corinda Ammon D Kris Burd

## 2017-09-25 NOTE — Unmapped (Signed)
Today's labs reviewed by Dr. Toni Arthurs and Ferd Glassing.  Of note, today's FK level is pending.  Per order, pt instructed to reduce prograf to 5mg  in am, 4mg  in pm.  Also confirmed his current dose, and that he is holding it prior to blood draw.  He will repeat labs on Friday.  Spanish interpreter used for this call.

## 2017-09-26 LAB — TACROLIMUS LEVEL: Tacrolimus (FK506) - LabCorp: 11.8 ng/mL (ref 2.0–20.0)

## 2017-09-27 ENCOUNTER — Other Ambulatory Visit
Admission: RE | Admit: 2017-09-27 | Discharge: 2017-09-27 | Disposition: A | Discharge status: Home / Self Care | Payer: Medicare Other | Source: Ambulatory Visit | Attending: Nephrology | Admitting: Nephrology

## 2017-09-27 DIAGNOSIS — E1129 Type 2 diabetes mellitus with other diabetic kidney complication: Secondary | ICD-10-CM | POA: Diagnosis not present

## 2017-09-27 DIAGNOSIS — Z09 Encounter for follow-up examination after completed treatment for conditions other than malignant neoplasm: Secondary | ICD-10-CM | POA: Insufficient documentation

## 2017-09-27 DIAGNOSIS — Z789 Other specified health status: Secondary | ICD-10-CM | POA: Diagnosis not present

## 2017-09-27 DIAGNOSIS — D899 Disorder involving the immune mechanism, unspecified: Secondary | ICD-10-CM | POA: Insufficient documentation

## 2017-09-27 DIAGNOSIS — B259 Cytomegaloviral disease, unspecified: Secondary | ICD-10-CM | POA: Insufficient documentation

## 2017-09-27 DIAGNOSIS — Z79899 Other long term (current) drug therapy: Secondary | ICD-10-CM | POA: Diagnosis not present

## 2017-09-27 DIAGNOSIS — X58XXXA Exposure to other specified factors, initial encounter: Secondary | ICD-10-CM | POA: Insufficient documentation

## 2017-09-27 DIAGNOSIS — D631 Anemia in chronic kidney disease: Secondary | ICD-10-CM | POA: Diagnosis not present

## 2017-09-27 DIAGNOSIS — N390 Urinary tract infection, site not specified: Secondary | ICD-10-CM | POA: Insufficient documentation

## 2017-09-27 DIAGNOSIS — T8610 Unspecified complication of kidney transplant: Secondary | ICD-10-CM | POA: Insufficient documentation

## 2017-09-27 DIAGNOSIS — Z114 Encounter for screening for human immunodeficiency virus [HIV]: Secondary | ICD-10-CM | POA: Insufficient documentation

## 2017-09-27 DIAGNOSIS — Z940 Kidney transplant status: Secondary | ICD-10-CM | POA: Diagnosis not present

## 2017-09-27 DIAGNOSIS — Z9483 Pancreas transplant status: Secondary | ICD-10-CM | POA: Diagnosis not present

## 2017-09-27 LAB — BASIC METABOLIC PANEL
ANION GAP: 12 (ref 5–15)
BUN: 46 mg/dL — ABNORMAL HIGH (ref 6–20)
CHLORIDE: 100 mmol/L — AB (ref 101–111)
CO2: 22 mmol/L (ref 22–32)
Calcium: 9.7 mg/dL (ref 8.9–10.3)
Creatinine, Ser: 3.33 mg/dL — ABNORMAL HIGH (ref 0.61–1.24)
GFR calc Af Amer: 22 mL/min — ABNORMAL LOW (ref >60)
GFR, EST NON AFRICAN AMERICAN: 19 mL/min — AB (ref >60)
GLUCOSE: 125 mg/dL — AB (ref 65–99)
POTASSIUM: 4.9 mmol/L (ref 3.5–5.1)
Sodium: 134 mmol/L — ABNORMAL LOW (ref 135–145)

## 2017-09-27 LAB — PHOSPHORUS
Lab: 3.6
Phosphorus: 3.6 mg/dL (ref 2.5–4.6)

## 2017-09-27 LAB — CBC WITH DIFFERENTIAL/PLATELET
Basophils Absolute: 0.1 10*3/uL (ref 0–0.1)
Basophils Relative: 1 %
Eosinophils Absolute: 0 10*3/uL (ref 0–0.7)
Eosinophils Relative: 0 %
HEMATOCRIT: 26.4 % — AB (ref 40.0–52.0)
HEMOGLOBIN: 9.2 g/dL — AB (ref 13.0–18.0)
LYMPHS PCT: 1 %
Lymphs Abs: 0 10*3/uL — ABNORMAL LOW (ref 1.0–3.6)
MCH: 32.7 pg (ref 26.0–34.0)
MCHC: 35 g/dL (ref 32.0–36.0)
MCV: 93.5 fL (ref 80.0–100.0)
MONOS PCT: 7 %
Monocytes Absolute: 0.3 10*3/uL (ref 0.2–1.0)
NEUTROS ABS: 4.2 10*3/uL (ref 1.4–6.5)
NEUTROS PCT: 91 %
Platelets: 345 10*3/uL (ref 150–440)
RBC: 2.82 MIL/uL — ABNORMAL LOW (ref 4.40–5.90)
RDW: 14.4 % (ref 11.5–14.5)
WBC: 4.6 10*3/uL (ref 3.8–10.6)

## 2017-09-27 LAB — MAGNESIUM
Lab: 1.6 — ABNORMAL LOW
Magnesium: 1.6 mg/dL — ABNORMAL LOW (ref 1.7–2.4)

## 2017-09-27 LAB — COMPREHENSIVE METABOLIC PANEL
BLOOD UREA NITROGEN: 46 mg/dL — ABNORMAL HIGH
CALCIUM: 9.7 mg/dL
CHLORIDE: 100 mmol/L — ABNORMAL LOW
CO2: 22 mmol/L
CREATININE: 3.33 mg/dL — ABNORMAL HIGH
EGFR MDRD AF AMER: 22 mL/min/{1.73_m2} — ABNORMAL LOW
EGFR MDRD NON AF AMER: 19 mL/min/{1.73_m2} — ABNORMAL LOW
GLUCOSE RANDOM: 125 mg/dL — ABNORMAL HIGH
POTASSIUM: 4.9 mmol/L

## 2017-09-27 LAB — CBC W/ DIFFERENTIAL
BASOPHILS ABSOLUTE COUNT: 0.1 10*9/L
BASOPHILS RELATIVE PERCENT: 1 %
EOSINOPHILS ABSOLUTE COUNT: 0 10*9/L
EOSINOPHILS RELATIVE PERCENT: 0 %
HEMATOCRIT: 26.4 % — ABNORMAL LOW
HEMOGLOBIN: 9.2 g/dL — ABNORMAL LOW
LYMPHOCYTES ABSOLUTE COUNT: 0 10*9/L — ABNORMAL LOW
LYMPHOCYTES RELATIVE PERCENT: 1 %
MEAN CORPUSCULAR HEMOGLOBIN CONC: 35 g/dL
MEAN CORPUSCULAR HEMOGLOBIN: 32.7 pg
MEAN CORPUSCULAR VOLUME: 93.5 fL
MONOCYTES ABSOLUTE COUNT: 0.3 10*9/L
MONOCYTES RELATIVE PERCENT: 7 %
NEUTROPHILS ABSOLUTE COUNT: 4.2 10*9/L
NEUTROPHILS RELATIVE PERCENT: 91 %
PLATELET COUNT: 345 10*9/L
RED BLOOD CELL COUNT: 2.82 10*12/L — ABNORMAL LOW
RED CELL DISTRIBUTION WIDTH: 14.4 %
WBC ADJUSTED: 4.6 10*9/L

## 2017-09-27 LAB — MEAN CORPUSCULAR HEMOGLOBIN: Lab: 32.7

## 2017-09-27 LAB — BLOOD UREA NITROGEN: Lab: 46 — ABNORMAL HIGH

## 2017-09-27 LAB — TACROLIMUS, TROUGH: Lab: 11.8

## 2017-09-29 LAB — TACROLIMUS LEVEL: Tacrolimus (FK506) - LabCorp: 6.8 ng/mL (ref 2.0–20.0)

## 2017-09-30 ENCOUNTER — Other Ambulatory Visit
Admission: RE | Admit: 2017-09-30 | Discharge: 2017-09-30 | Disposition: A | Discharge status: Home / Self Care | Payer: Medicare Other | Source: Ambulatory Visit | Attending: Nephrology | Admitting: Nephrology

## 2017-09-30 DIAGNOSIS — Z789 Other specified health status: Secondary | ICD-10-CM | POA: Insufficient documentation

## 2017-09-30 DIAGNOSIS — Z9483 Pancreas transplant status: Secondary | ICD-10-CM | POA: Insufficient documentation

## 2017-09-30 DIAGNOSIS — T8610 Unspecified complication of kidney transplant: Secondary | ICD-10-CM | POA: Diagnosis not present

## 2017-09-30 DIAGNOSIS — N390 Urinary tract infection, site not specified: Secondary | ICD-10-CM | POA: Insufficient documentation

## 2017-09-30 DIAGNOSIS — Z114 Encounter for screening for human immunodeficiency virus [HIV]: Secondary | ICD-10-CM | POA: Diagnosis not present

## 2017-09-30 DIAGNOSIS — E1129 Type 2 diabetes mellitus with other diabetic kidney complication: Secondary | ICD-10-CM | POA: Insufficient documentation

## 2017-09-30 DIAGNOSIS — D631 Anemia in chronic kidney disease: Secondary | ICD-10-CM | POA: Diagnosis not present

## 2017-09-30 DIAGNOSIS — Z940 Kidney transplant status: Secondary | ICD-10-CM | POA: Diagnosis not present

## 2017-09-30 DIAGNOSIS — Z79899 Other long term (current) drug therapy: Secondary | ICD-10-CM | POA: Insufficient documentation

## 2017-09-30 DIAGNOSIS — D899 Disorder involving the immune mechanism, unspecified: Secondary | ICD-10-CM | POA: Insufficient documentation

## 2017-09-30 DIAGNOSIS — B269 Mumps without complication: Secondary | ICD-10-CM | POA: Insufficient documentation

## 2017-09-30 DIAGNOSIS — Z09 Encounter for follow-up examination after completed treatment for conditions other than malignant neoplasm: Secondary | ICD-10-CM | POA: Diagnosis not present

## 2017-09-30 LAB — CBC WITH DIFFERENTIAL/PLATELET
BASOS ABS: 0.1 10*3/uL (ref 0–0.1)
Basophils Relative: 2 %
Eosinophils Absolute: 0 10*3/uL (ref 0–0.7)
Eosinophils Relative: 1 %
HEMATOCRIT: 28.4 % — AB (ref 40.0–52.0)
Hemoglobin: 9.4 g/dL — ABNORMAL LOW (ref 13.0–18.0)
LYMPHS PCT: 1 %
Lymphs Abs: 0 10*3/uL — ABNORMAL LOW (ref 1.0–3.6)
MCH: 31 pg (ref 26.0–34.0)
MCHC: 33 g/dL (ref 32.0–36.0)
MCV: 93.9 fL (ref 80.0–100.0)
MONO ABS: 0.3 10*3/uL (ref 0.2–1.0)
Monocytes Relative: 7 %
NEUTROS ABS: 3.2 10*3/uL (ref 1.4–6.5)
Neutrophils Relative %: 89 %
Platelets: 387 10*3/uL (ref 150–440)
RBC: 3.03 MIL/uL — ABNORMAL LOW (ref 4.40–5.90)
RDW: 14.7 % — AB (ref 11.5–14.5)
WBC: 3.5 10*3/uL — ABNORMAL LOW (ref 3.8–10.6)

## 2017-09-30 LAB — BASIC METABOLIC PANEL
Anion gap: 11 (ref 5–15)
BUN: 42 mg/dL — ABNORMAL HIGH (ref 6–20)
CHLORIDE: 101 mmol/L (ref 101–111)
CO2: 23 mmol/L (ref 22–32)
CREATININE: 3.24 mg/dL — AB (ref 0.61–1.24)
Calcium: 9.8 mg/dL (ref 8.9–10.3)
GFR calc non Af Amer: 19 mL/min — ABNORMAL LOW (ref >60)
GFR, EST AFRICAN AMERICAN: 22 mL/min — AB (ref >60)
Glucose, Bld: 121 mg/dL — ABNORMAL HIGH (ref 65–99)
Potassium: 4.8 mmol/L (ref 3.5–5.1)
Sodium: 135 mmol/L (ref 135–145)

## 2017-09-30 LAB — PHOSPHORUS: Phosphorus: 3 mg/dL (ref 2.5–4.6)

## 2017-09-30 LAB — MAGNESIUM: MAGNESIUM: 1.5 mg/dL — AB (ref 1.7–2.4)

## 2017-09-30 LAB — TACROLIMUS, TROUGH: Lab: 6.8

## 2017-09-30 MED ORDER — PROGRAF 1 MG CAPSULE
ORAL_CAPSULE | 11 refills | 0 days
Start: 2017-09-30 — End: 2017-10-28

## 2017-09-30 NOTE — Unmapped (Signed)
1030 spoke w/[facility] via phone using [address] [place], [place] #[id number]. I let patient know that due to his recent biopsy (showing TMA) that Dr Toni Arthurs would like him to decrease his Prograf to 4 mg bid. Pt located his medication bottle and repeated back dose change accurately and even asked if this was the only change. He had no further questions or concerns.

## 2017-10-01 LAB — TACROLIMUS LEVEL: TACROLIMUS (FK506) - LABCORP: 13.4 ng/mL (ref 2.0–20.0)

## 2017-10-02 ENCOUNTER — Other Ambulatory Visit
Admission: RE | Admit: 2017-10-02 | Discharge: 2017-10-02 | Disposition: A | Discharge status: Home / Self Care | Payer: Medicare Other | Source: Ambulatory Visit | Attending: Nephrology | Admitting: Nephrology

## 2017-10-02 DIAGNOSIS — D899 Disorder involving the immune mechanism, unspecified: Secondary | ICD-10-CM | POA: Insufficient documentation

## 2017-10-02 DIAGNOSIS — X58XXXA Exposure to other specified factors, initial encounter: Secondary | ICD-10-CM | POA: Insufficient documentation

## 2017-10-02 DIAGNOSIS — E1129 Type 2 diabetes mellitus with other diabetic kidney complication: Secondary | ICD-10-CM | POA: Insufficient documentation

## 2017-10-02 DIAGNOSIS — D631 Anemia in chronic kidney disease: Secondary | ICD-10-CM | POA: Diagnosis not present

## 2017-10-02 DIAGNOSIS — Z79899 Other long term (current) drug therapy: Secondary | ICD-10-CM | POA: Diagnosis present

## 2017-10-02 DIAGNOSIS — T8610 Unspecified complication of kidney transplant: Secondary | ICD-10-CM | POA: Diagnosis not present

## 2017-10-02 DIAGNOSIS — Z09 Encounter for follow-up examination after completed treatment for conditions other than malignant neoplasm: Secondary | ICD-10-CM | POA: Diagnosis not present

## 2017-10-02 DIAGNOSIS — Z789 Other specified health status: Secondary | ICD-10-CM | POA: Insufficient documentation

## 2017-10-02 DIAGNOSIS — B269 Mumps without complication: Secondary | ICD-10-CM | POA: Diagnosis not present

## 2017-10-02 DIAGNOSIS — Z940 Kidney transplant status: Secondary | ICD-10-CM | POA: Insufficient documentation

## 2017-10-02 DIAGNOSIS — N390 Urinary tract infection, site not specified: Secondary | ICD-10-CM | POA: Insufficient documentation

## 2017-10-02 DIAGNOSIS — Z114 Encounter for screening for human immunodeficiency virus [HIV]: Secondary | ICD-10-CM | POA: Diagnosis not present

## 2017-10-02 DIAGNOSIS — Z9483 Pancreas transplant status: Secondary | ICD-10-CM | POA: Diagnosis not present

## 2017-10-02 LAB — CBC WITH DIFFERENTIAL/PLATELET
BASOS ABS: 0.1 10*3/uL (ref 0–0.1)
Basophils Relative: 2 %
EOS ABS: 0 10*3/uL (ref 0–0.7)
EOS PCT: 1 %
HCT: 28.9 % — ABNORMAL LOW (ref 40.0–52.0)
Hemoglobin: 9.5 g/dL — ABNORMAL LOW (ref 13.0–18.0)
Lymphocytes Relative: 1 %
Lymphs Abs: 0.1 10*3/uL — ABNORMAL LOW (ref 1.0–3.6)
MCH: 31.5 pg (ref 26.0–34.0)
MCHC: 33 g/dL (ref 32.0–36.0)
MCV: 95.6 fL (ref 80.0–100.0)
MONO ABS: 0.3 10*3/uL (ref 0.2–1.0)
Monocytes Relative: 6 %
Neutro Abs: 4.9 10*3/uL (ref 1.4–6.5)
Neutrophils Relative %: 90 %
PLATELETS: 436 10*3/uL (ref 150–440)
RBC: 3.03 MIL/uL — AB (ref 4.40–5.90)
RDW: 14.5 % (ref 11.5–14.5)
WBC: 5.4 10*3/uL (ref 3.8–10.6)

## 2017-10-02 LAB — BASIC METABOLIC PANEL
Anion gap: 11 (ref 5–15)
BLOOD UREA NITROGEN: 42 mg/dL — ABNORMAL HIGH
BUN: 43 mg/dL — ABNORMAL HIGH (ref 6–20)
CALCIUM: 9.8 mg/dL
CALCIUM: 9.7 mg/dL (ref 8.9–10.3)
CO2: 23 mmol/L
CO2: 24 mmol/L (ref 22–32)
Chloride: 100 mmol/L — ABNORMAL LOW (ref 101–111)
Creatinine, Ser: 3.39 mg/dL — ABNORMAL HIGH (ref 0.61–1.24)
EGFR MDRD NON AF AMER: 19 mL/min/{1.73_m2} — ABNORMAL LOW
GFR, EST AFRICAN AMERICAN: 21 mL/min — AB (ref >60)
GFR, EST NON AFRICAN AMERICAN: 18 mL/min — AB (ref >60)
GLUCOSE RANDOM: 121 mg/dL — ABNORMAL HIGH
Glucose, Bld: 121 mg/dL — ABNORMAL HIGH (ref 65–99)
POTASSIUM: 4.8 mmol/L
Potassium: 5.2 mmol/L — ABNORMAL HIGH (ref 3.5–5.1)
SODIUM: 135 mmol/L (ref 135–145)

## 2017-10-02 LAB — MAGNESIUM
Lab: 1.5 — ABNORMAL LOW
MAGNESIUM: 1.5 mg/dL — AB (ref 1.7–2.4)

## 2017-10-02 LAB — PHOSPHORUS
Lab: 3
Phosphorus: 3.1 mg/dL (ref 2.5–4.6)

## 2017-10-02 LAB — CBC W/ DIFFERENTIAL
BASOPHILS ABSOLUTE COUNT: 0.1 10*9/L
BASOPHILS RELATIVE PERCENT: 2 %
EOSINOPHILS RELATIVE PERCENT: 1 %
HEMATOCRIT: 28.4 % — ABNORMAL LOW
HEMOGLOBIN: 9.4 g/dL — ABNORMAL LOW
LYMPHOCYTES ABSOLUTE COUNT: 0 10*9/L — ABNORMAL LOW
LYMPHOCYTES RELATIVE PERCENT: 1 %
MEAN CORPUSCULAR HEMOGLOBIN CONC: 33 g/dL
MEAN CORPUSCULAR HEMOGLOBIN: 31 pg
MEAN CORPUSCULAR VOLUME: 93.9 fL
MONOCYTES ABSOLUTE COUNT: 0.3 10*9/L
MONOCYTES RELATIVE PERCENT: 7 %
NEUTROPHILS ABSOLUTE COUNT: 3.2 10*9/L
NEUTROPHILS RELATIVE PERCENT: 89 %
RED BLOOD CELL COUNT: 3.03 10*12/L — ABNORMAL LOW
WBC ADJUSTED: 3.5 10*9/L — ABNORMAL LOW

## 2017-10-02 LAB — RED BLOOD CELL COUNT: Lab: 3.03 — ABNORMAL LOW

## 2017-10-02 LAB — BLOOD UREA NITROGEN: Lab: 42 — ABNORMAL HIGH

## 2017-10-03 LAB — TACROLIMUS LEVEL: TACROLIMUS (FK506) - LABCORP: 8.8 ng/mL (ref 2.0–20.0)

## 2017-10-04 ENCOUNTER — Other Ambulatory Visit
Admission: RE | Admit: 2017-10-04 | Discharge: 2017-10-04 | Disposition: A | Discharge status: Home / Self Care | Payer: Medicare Other | Source: Ambulatory Visit | Attending: Nephrology | Admitting: Nephrology

## 2017-10-04 DIAGNOSIS — D631 Anemia in chronic kidney disease: Secondary | ICD-10-CM | POA: Diagnosis not present

## 2017-10-04 DIAGNOSIS — N390 Urinary tract infection, site not specified: Secondary | ICD-10-CM | POA: Diagnosis not present

## 2017-10-04 DIAGNOSIS — E1129 Type 2 diabetes mellitus with other diabetic kidney complication: Secondary | ICD-10-CM | POA: Diagnosis not present

## 2017-10-04 DIAGNOSIS — Z9483 Pancreas transplant status: Secondary | ICD-10-CM | POA: Diagnosis not present

## 2017-10-04 DIAGNOSIS — E559 Vitamin D deficiency, unspecified: Secondary | ICD-10-CM | POA: Diagnosis not present

## 2017-10-04 DIAGNOSIS — Z114 Encounter for screening for human immunodeficiency virus [HIV]: Secondary | ICD-10-CM | POA: Diagnosis not present

## 2017-10-04 DIAGNOSIS — Z79899 Other long term (current) drug therapy: Secondary | ICD-10-CM | POA: Diagnosis present

## 2017-10-04 DIAGNOSIS — Z09 Encounter for follow-up examination after completed treatment for conditions other than malignant neoplasm: Secondary | ICD-10-CM | POA: Insufficient documentation

## 2017-10-04 DIAGNOSIS — Z940 Kidney transplant status: Secondary | ICD-10-CM | POA: Insufficient documentation

## 2017-10-04 DIAGNOSIS — Z789 Other specified health status: Secondary | ICD-10-CM | POA: Diagnosis not present

## 2017-10-04 DIAGNOSIS — D899 Disorder involving the immune mechanism, unspecified: Secondary | ICD-10-CM | POA: Diagnosis present

## 2017-10-04 LAB — CBC WITH DIFFERENTIAL/PLATELET
Basophils Absolute: 0.1 10*3/uL (ref 0–0.1)
Basophils Relative: 1 %
EOS ABS: 0 10*3/uL (ref 0–0.7)
EOS PCT: 1 %
HCT: 27.8 % — ABNORMAL LOW (ref 40.0–52.0)
Hemoglobin: 9.3 g/dL — ABNORMAL LOW (ref 13.0–18.0)
Lymphocytes Relative: 1 %
Lymphs Abs: 0.1 10*3/uL — ABNORMAL LOW (ref 1.0–3.6)
MCH: 31.8 pg (ref 26.0–34.0)
MCHC: 33.4 g/dL (ref 32.0–36.0)
MCV: 95.2 fL (ref 80.0–100.0)
MONO ABS: 0.4 10*3/uL (ref 0.2–1.0)
Monocytes Relative: 6 %
Neutro Abs: 5.7 10*3/uL (ref 1.4–6.5)
Neutrophils Relative %: 91 %
PLATELETS: 424 10*3/uL (ref 150–440)
RBC: 2.92 MIL/uL — AB (ref 4.40–5.90)
RDW: 14.9 % — AB (ref 11.5–14.5)
WBC: 6.2 10*3/uL (ref 3.8–10.6)

## 2017-10-04 LAB — BASIC METABOLIC PANEL
ANION GAP: 11 (ref 5–15)
BLOOD UREA NITROGEN: 40 mg/dL — ABNORMAL HIGH
BUN: 40 mg/dL — ABNORMAL HIGH (ref 6–20)
CALCIUM: 9.7 mg/dL (ref 8.9–10.3)
CALCIUM: 9.7 mg/dL
CHLORIDE: 100 mmol/L — ABNORMAL LOW
CO2: 24 mmol/L (ref 22–32)
CO2: 24 mmol/L
CREATININE: 2.97 mg/dL — ABNORMAL HIGH
Chloride: 100 mmol/L — ABNORMAL LOW (ref 101–111)
Creatinine, Ser: 2.97 mg/dL — ABNORMAL HIGH (ref 0.61–1.24)
EGFR MDRD NON AF AMER: 21 mL/min/{1.73_m2} — ABNORMAL LOW
GFR, EST AFRICAN AMERICAN: 25 mL/min — AB (ref >60)
GFR, EST NON AFRICAN AMERICAN: 21 mL/min — AB (ref >60)
GLUCOSE RANDOM: 119 mg/dL — ABNORMAL HIGH
Glucose, Bld: 119 mg/dL — ABNORMAL HIGH (ref 65–99)
Potassium: 4.8 mmol/L (ref 3.5–5.1)
SODIUM: 135 mmol/L (ref 135–145)
SODIUM: 135 mmol/L

## 2017-10-04 LAB — MAGNESIUM
Lab: 1.4 — ABNORMAL LOW
Magnesium: 1.4 mg/dL — ABNORMAL LOW (ref 1.7–2.4)

## 2017-10-04 LAB — PHOSPHORUS
Lab: 2.8
PHOSPHORUS: 2.8 mg/dL (ref 2.5–4.6)

## 2017-10-04 LAB — CBC W/ DIFFERENTIAL
BASOPHILS ABSOLUTE COUNT: 0.1 10*9/L
BASOPHILS RELATIVE PERCENT: 1 %
EOSINOPHILS ABSOLUTE COUNT: 0 10*9/L
EOSINOPHILS RELATIVE PERCENT: 1 %
HEMATOCRIT: 27.8 % — ABNORMAL LOW
HEMOGLOBIN: 9.3 g/dL — ABNORMAL LOW
LYMPHOCYTES ABSOLUTE COUNT: 0.1 10*9/L — ABNORMAL LOW
LYMPHOCYTES RELATIVE PERCENT: 1 %
MEAN CORPUSCULAR HEMOGLOBIN CONC: 33.4 g/dL
MEAN CORPUSCULAR HEMOGLOBIN: 31.8 pg
MEAN CORPUSCULAR VOLUME: 95.2 fL
MONOCYTES ABSOLUTE COUNT: 0.4 10*9/L
NEUTROPHILS RELATIVE PERCENT: 91 %
PLATELET COUNT: 424 10*9/L
RED BLOOD CELL COUNT: 2.92 10*12/L — ABNORMAL LOW
WBC ADJUSTED: 6.2 10*9/L

## 2017-10-04 LAB — HEMOGLOBIN: Lab: 9.3 — ABNORMAL LOW

## 2017-10-04 LAB — CALCIUM: Lab: 9.7

## 2017-10-06 LAB — TACROLIMUS LEVEL: Tacrolimus (FK506) - LabCorp: 7.1 ng/mL (ref 2.0–20.0)

## 2017-10-07 ENCOUNTER — Other Ambulatory Visit
Admission: RE | Admit: 2017-10-07 | Discharge: 2017-10-07 | Disposition: A | Discharge status: Home / Self Care | Payer: Medicare Other | Source: Ambulatory Visit | Attending: Nephrology | Admitting: Nephrology

## 2017-10-07 DIAGNOSIS — D899 Disorder involving the immune mechanism, unspecified: Secondary | ICD-10-CM | POA: Insufficient documentation

## 2017-10-07 DIAGNOSIS — E1129 Type 2 diabetes mellitus with other diabetic kidney complication: Secondary | ICD-10-CM | POA: Insufficient documentation

## 2017-10-07 DIAGNOSIS — Z114 Encounter for screening for human immunodeficiency virus [HIV]: Secondary | ICD-10-CM | POA: Insufficient documentation

## 2017-10-07 DIAGNOSIS — E559 Vitamin D deficiency, unspecified: Secondary | ICD-10-CM | POA: Insufficient documentation

## 2017-10-07 DIAGNOSIS — Z79899 Other long term (current) drug therapy: Secondary | ICD-10-CM | POA: Insufficient documentation

## 2017-10-07 DIAGNOSIS — N390 Urinary tract infection, site not specified: Secondary | ICD-10-CM | POA: Diagnosis not present

## 2017-10-07 DIAGNOSIS — B259 Cytomegaloviral disease, unspecified: Secondary | ICD-10-CM | POA: Diagnosis not present

## 2017-10-07 DIAGNOSIS — Z09 Encounter for follow-up examination after completed treatment for conditions other than malignant neoplasm: Secondary | ICD-10-CM | POA: Diagnosis not present

## 2017-10-07 DIAGNOSIS — Z9483 Pancreas transplant status: Secondary | ICD-10-CM | POA: Diagnosis not present

## 2017-10-07 DIAGNOSIS — D631 Anemia in chronic kidney disease: Secondary | ICD-10-CM | POA: Insufficient documentation

## 2017-10-07 DIAGNOSIS — Z789 Other specified health status: Secondary | ICD-10-CM | POA: Insufficient documentation

## 2017-10-07 DIAGNOSIS — N189 Chronic kidney disease, unspecified: Secondary | ICD-10-CM | POA: Diagnosis not present

## 2017-10-07 DIAGNOSIS — Z940 Kidney transplant status: Secondary | ICD-10-CM | POA: Diagnosis not present

## 2017-10-07 LAB — CBC WITH DIFFERENTIAL/PLATELET
Basophils Absolute: 0.1 10*3/uL (ref 0–0.1)
Basophils Relative: 1 %
Eosinophils Absolute: 0.1 10*3/uL (ref 0–0.7)
Eosinophils Relative: 1 %
HEMATOCRIT: 30.9 % — AB (ref 40.0–52.0)
HEMOGLOBIN: 10.2 g/dL — AB (ref 13.0–18.0)
LYMPHS ABS: 0.1 10*3/uL — AB (ref 1.0–3.6)
Lymphocytes Relative: 1 %
MCH: 31.6 pg (ref 26.0–34.0)
MCHC: 33 g/dL (ref 32.0–36.0)
MCV: 95.8 fL (ref 80.0–100.0)
Monocytes Absolute: 0.4 10*3/uL (ref 0.2–1.0)
Monocytes Relative: 5 %
NEUTROS ABS: 8.2 10*3/uL — AB (ref 1.4–6.5)
NEUTROS PCT: 92 %
Platelets: 424 10*3/uL (ref 150–440)
RBC: 3.23 MIL/uL — AB (ref 4.40–5.90)
RDW: 15.1 % — ABNORMAL HIGH (ref 11.5–14.5)
WBC: 8.9 10*3/uL (ref 3.8–10.6)

## 2017-10-07 LAB — BASIC METABOLIC PANEL
ANION GAP: 10 (ref 5–15)
BUN: 37 mg/dL — ABNORMAL HIGH (ref 6–20)
CALCIUM: 10.1 mg/dL (ref 8.9–10.3)
CHLORIDE: 102 mmol/L (ref 101–111)
CO2: 25 mmol/L (ref 22–32)
Creatinine, Ser: 2.7 mg/dL — ABNORMAL HIGH (ref 0.61–1.24)
GFR calc Af Amer: 28 mL/min — ABNORMAL LOW (ref >60)
GFR calc non Af Amer: 24 mL/min — ABNORMAL LOW (ref >60)
GLUCOSE: 130 mg/dL — AB (ref 65–99)
Potassium: 5 mmol/L (ref 3.5–5.1)
Sodium: 137 mmol/L (ref 135–145)

## 2017-10-07 LAB — PHOSPHORUS: Phosphorus: 3 mg/dL (ref 2.5–4.6)

## 2017-10-07 LAB — MAGNESIUM: Magnesium: 1.4 mg/dL — ABNORMAL LOW (ref 1.7–2.4)

## 2017-10-07 MED ORDER — AMLODIPINE 10 MG TABLET
ORAL_TABLET | Freq: Every day | ORAL | 4 refills | 0.00000 days | Status: CP
Start: 2017-10-07 — End: 2017-11-05

## 2017-10-07 MED ORDER — AMLODIPINE 10 MG TABLET: 10 mg | tablet | 4 refills | 0 days

## 2017-10-07 NOTE — Unmapped (Signed)
[facility] transplant nephrology clinic visit    assessment and plan  1. s/p kidney transplant 09/03/2017. baseline creatinine 2.5-3 mg/dl. +moderate proteinuria. no donor specific hla ab detected. no change in maintenance immunosuppression. potential repeat kidney bx +/-transition to belatacept maintenance if persistent thrombotic microangiopathy and/or kidney function does not continue to improve. tacrolimus lvl 6-8 ng/dl.  2. hypertension. hold carvedilol. blood pressure goal <130/80 mmhg.  3. metabolic acidosis. sodium bicarbonate decr to 650mg  bid.  4. hypomagnesemia. mg chloride 64mg  bid.  5. preventive medicine. influenza '18. ppsv23 pneumococcal '12. valganciclovir 450mg  3x/wk 2-5/19. trimethoprim/sulfamethoxazole 80/400mg  3x/wk x87m 2-8/19.     history of present illness    Justin Mcknight is a 62 year old gentleman seen in follow up s/p kidney transplant 09/03/2017. post transplant course notable for +mild focal thrombotic microangiopathy seen on zero hour kidney bx and on follow up x2wks post transplant. +slow improving creatinine lvl.     Justin Mcknight is seen with assistance of [name] Engineer, structural. he feels well in general. +mild fatigue and dyspnea with mod exertion. no chest pain palpitations shortness of breath at rest orthopnea or lower ext edema. appetite improving. +occ nausea/vomiting x2 episodes in past week. no abd pain/cramping. no diarrhea. no dizziness or lightheaded. +urinary frequency and nocturia. no dysuria or difficulty with bladder emptying. +mild tremor. all other systems reviewed and negative x10 systems.    past medical hx:  1. 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 hypertension. alemtuzumab induction. baseline creatinine 2.5-3 mg/dl.  > kidney bx 03/19: no acute rejection. +mild focal thrombotic microangiopathy/seen on zero hour bx.  2. hypertension  3. hx renal cell carcinoma s/p right nephrectomy '12.    past surgical hx: left forearm av fistula '12. right nephrectomy '12. kidney transplant '19.    allergies: codeine    medications: tacrolimus 4mg  bid, mycophenolate sodium 540mg  bid, amlodipine 10mg  daily, carvedilol 6.25mg  bid, valganciclovir 450mg  3x/wk, trimethoprim/sulfamethoxazole 80/400mg  3x/wk, famotidine 20mg  prn, na+bicarbonate 650mg  tid.    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    physical exam: t96.7 p100 bp112/85 wt76.3kg bmi 21.6. wd/wn gentleman nl/appropriate affect and mood. nl sclera anicteric. mmm no thrush. neck supple no palpable ln. heart rrr nl s1s2 no m/r/g. lungs clear bilateral. abd soft nt/nd well healed right lower quadrant incision; staples removed. no lower ext edema. msk no synovitis/tophi. skin no rash. neuro alert oriented non focal exam.    labs 10/07/17: wbc8.9 hgb10.2 hct30.9 plts424. na137 k5.0 cl102 bicarb25 bun37 cr2.7 glc130 ca10.1 mg1.4 phos3.0. tacrolimus lvl 8.2 ng/ml. urine protein/cr 1.243.    scribe's attestation: Aleicia Kenagy, md obtained and performed the history, physical exam and medical decision making elements that were entered into the chart. signed by [place] ormond foster, scribe, on October 08, 2017 11:15 am.    ----------------------------------------------------------------------------------------------------------------------  October 08, 2017 4:06 PM. documentation assistance provided by the scribe. i was present during the time the encounter was recorded. the information recorded by the scribe was done at my direction and has been reviewed and validated by me.  ----------------------------------------------------------------------------------------------------------------------

## 2017-10-07 NOTE — Unmapped (Addendum)
Outpatient Adult Nutrition - Post Renal Transplant Follow-up     Referring MD or Clinic: Post Renal Transplant Clinic     Vicente Serene [facility]) interpreter was present for the entire visit and interpreted the entire session.    Reason for Visit: Post Renal Transplant Evaluation Follow-up; renal transplant 09/03/17     Changes from last RD visit on 09/04/17  Following food safety guidelines  Improved appetite during past few days  Eating more vegetables and fruits  Eating more tortillas, but limiting to 3 per meal  Eating chicken and beef       Nutrition goals from last RD visit on 09/04/17  Oral Intake:       - PO diet will be advanced within 48 hrs- met  Laboratory Data:       - Electrolyte and renal profile will trend towards normal limits-met    PMH:   Patient Active Problem List   Diagnosis   ??? End-stage renal disease (ESRD) (CMS-HCC)   ??? Renal cell cancer (CMS-HCC)   ??? Hypertension   ??? Kidney replaced by transplant       Anthropometrics:   October 08, 2017 Weight: 76.3 kg  Height: 188 cm  BMI: 21.59  IBW: 86.27 kg    Pt is normal weight per BMI standards.        Previous Weights:      09/03/17 84.2 kg (185 lb 10 oz)   02/25/17 83.5 kg (184 lb)   02/13/16 85.2 kg (187 lb 12.8 oz)   06/07/14 86.8 kg (191 lb 6.4 oz)   09/02/13 80.2 kg (176 lb 11.2 oz)   07/06/13 83 kg (183 lb)   01/21/13 75.8 kg (167 lb)   11/05/12 77.6 kg (171 lb)   03/05/12 85.3 kg (188 lb 0.8 oz)   01/14/12 84.1 kg (185 lb 6.5 oz)         Weight Change: weight loss 7.9 kg during past 5 weeks    Nutrition-Focused Physical Findings:   -- Muscle mass: no loss noted  ----- Areas Assessed: temple  -- Fat mass: no loss noted  ----- Areas Assessed: orbital    Relevant Medications, Herbs, Supplements include: Reviewed all nutritionally relevant medications and supplements   Prograf (aware of grapefruit, pomegranate, star fruit and limit clementine x 1 daily)  Mycophenolate (aware of grapefruit, pomegranate, star fruit and limit clementine x 1 daily)  Mg oxide (starting today)  Ondansetron, prn  Sodium bicarbonate        Relevant Labs: 10/04/17 Mg 1.4 (L) team aware and monitoring    Physical Activity: walking about 30 minutes daily    Dietary Restrictions: food safety guidelines     Allergies:   Allergies   Allergen Reactions   ??? Codeine Swelling        Usual Intake:   1st - eggs, and chicken soup with vegetables and jello or chicken noodle soup or pan fried meat with onions with apple juice and Ensure (strawberry flavored) x 1  Snack - apple x 1  2nd - steak with onions and tomatoes or fried chicken or sautee chicken with tortillas x 3 or tacos with braised beef with rice with apple juice  Snack - rice water with cookies (maria cookies) or sweet rolls   3rd - cereal (special K with strawberries) with milk (2%)   Snack - none    Beverages - apple juice, Ensure, rice water, milk (2%)    Other Usual Intake: sweet mexican bread, coffee with milk  Behavioral Risk Factors:  Emotional Eating?? No  Meal Schedule?? Yes  Meal Skipping?? Yes  Grazing?? Yes  Night Eating?? No  Sleep?? 3.5 hours, lots of bathroom breaks  Overeating?? Yes    Hunger and Satiety: pt endorses good appetite    Nutrition History: denies c/s/n/v/d at this time; nasea when not eating over long periods of time    Social: Married, wife attended session with pt; she lives in [place] and trying to assist husband with his meals     Estimated Daily Nutritional Needs:   Energy: 2105-2526 kcals [25-30 kcal/kg using admission body weight, 84.2 kg (09/04/17 1125)]  Protein: 101-126 gm [1.2-1.5 gm/kg using admission body weight, 84.2 kg (09/04/17 1125)]  Carbohydrate:   [45-60% of kcal]  Fluid:   [per MD team]    Nutrition Assessment: Current diet is high in CHO and low in protein d/t skipped meals and limited protein foods at meal/snack time. Pt would benefit from eating 3 meals daily with appropriate lean protein, non starchy vegetables, fruit, whole grains, low fat dairy foods, and adequate hydration to optimize his nutrition.     Pt experienced 9.38 % wt loss during past 5 weeks which may be likely d/t poor po intake during hospitalization requiring ONS, fluid changes, and/or weighing error. Will need to monitor wts.     Pt may have blood sugar management issues d/t consumption of high CHO foods, no meal after 3 PM and before 10 AM, limited protein, and limited exercise. Will need to monitor blood sugar. Pt may benefit from blood sugar management if elevation of blood sugar continues.     Mg has decreased. Pt would benefit from foods high in Mg.     Pt assessed to have limited nutrition knowledge for post renal transplant MNT and would benefit from nutrition education.     Progress towards goals: in process    Nutrition Diagnosis:  Malnutrition Assessment using AND/ASPEN Clinical Characteristics:    Patient does not meet AND/ASPEN criteria for malnutrition at this time (10/08/17 1201)                      Food and nutrition related knowledge deficit as related to limited knowledge for post renal transplant MNT as evidenced by per pt report.      Education: post renal transplant MNT goals and expectation, brief food safety guideline review, hydration, brief CHO awareness of foods, label reading, and exercise    Nutrition Goals:  Meet nutritional needs   Reduce long-term health risk  Maintain current wt   Normalization of Mg    Long term goal of wt gain of 4-5 kg by April, 2020    Interventions:   Try to limit juice and sweetened beverages  Eat 3 meals daily with appropriate protein and add foods high in magnesium  Drink at least 80 ounces of fluids daily  Exercise when you are able    Espanol:    Trate de limitar los jugos o bebidas altas en azucar  Coma 3 veces al dia con suficiente proteina y coma comidas altas en magnesio  Beba por lo menos 80 onzas de agua al dia  Haga ejercicio cuando pueda.      Expected Compliance is:  Comprehension of plan good  Readiness for change good  Ability to meet goals good    Materials Provided were:  List of recommendations    Handout explaining prescribed diet  RD contact information   Nutrition following kidney transplant  in Spanish  Mg containing foods (pt stated he has someone who can read this to him)    Follow-up: Next MD visit, or when consulted    Length of visit was: 30 minutes    Greta Doom, MS, RDN, CSG, LDN  [phone] pager

## 2017-10-07 NOTE — Unmapped (Addendum)
[facility] Refill and Clinical Coordination Note  Medication(s): myfortic, valganciclovir    Justin Mcknight, DOB: Jul 11, 1955  Phone: [phone] (home) , Alternate phone contact: N/A  Shipping address: [address]  [place] [address]  Phone or address changes today?: No  All above HIPAA information verified.  Insurance changes? No    Completed refill and clinical call assessment today to schedule patient's medication shipment from the [facility] [phone]).      MEDICATION RECONCILIATION    Confirmed the medication and dosage are correct and have not changed: Yes, regimen is correct and unchanged.    Were there any changes to your medication(s) in the past month:  No, there are no changes reported at this time.    ADHERENCE    Is this medicine transplant or covered by Medicare Part B? Yes.    Myfortic 180 mg   Quantity filled last month: 180   # of tablets left on hand: 5 days left  valganciclovr 450mg , last: 30, left 5 days left    Did you miss any doses in the past 4 weeks? No missed doses reported.  Adherence counseling provided? Not needed     SIDE EFFECT MANAGEMENT    Are you tolerating your medication?:  Justin Mcknight reports side effects of itching from sodium bicarb - patient says doctor told him to continue med - i will message txp coordinator on this as well.  Side effect management discussed: see above      Therapy is appropriate and should be continued.    Evidence of clinical benefit: See Epic note from 09/12/17      FINANCIAL/SHIPPING    Delivery Scheduled: Yes, Expected medication delivery date: 10/09/17   Additional medications refilled: coreg and amlodipine (need new rx) - 4 rx total - does not need anything else (bactrim included) today    The patient will receive an FSI print out for each medication shipped and additional FDA Medication Guides as required.  Patient education from [name] or [name] may also be included in the shipment.    Justin Mcknight did not have any additional questions at this time.    Delivery address validated in FSI scheduling system: Yes, address listed above is correct.      We will follow up with patient monthly for standard refill processing and delivery.      Thank you,  Thad Ranger   [facility] Specialty Pharmacist

## 2017-10-08 ENCOUNTER — Ambulatory Visit: Admit: 2017-10-08 | Discharge: 2017-10-08 | Payer: MEDICARE

## 2017-10-08 ENCOUNTER — Institutional Professional Consult (permissible substitution)
Admit: 2017-10-08 | Discharge: 2017-10-08 | Payer: MEDICARE | Attending: Pharmacist Clinician (PhC)/ Clinical Pharmacy Specialist | Primary: Pharmacist Clinician (PhC)/ Clinical Pharmacy Specialist

## 2017-10-08 ENCOUNTER — Ambulatory Visit: Admit: 2017-10-08 | Discharge: 2017-10-08 | Payer: MEDICARE | Attending: Registered" | Primary: Registered"

## 2017-10-08 ENCOUNTER — Ambulatory Visit: Admit: 2017-10-08 | Discharge: 2017-10-08 | Payer: MEDICARE | Attending: Nephrology | Primary: Nephrology

## 2017-10-08 DIAGNOSIS — D899 Disorder involving the immune mechanism, unspecified: Secondary | ICD-10-CM

## 2017-10-08 DIAGNOSIS — Z940 Kidney transplant status: Secondary | ICD-10-CM

## 2017-10-08 DIAGNOSIS — Z4822 Encounter for aftercare following kidney transplant: Principal | ICD-10-CM

## 2017-10-08 DIAGNOSIS — Z79899 Other long term (current) drug therapy: Secondary | ICD-10-CM

## 2017-10-08 LAB — TACROLIMUS LEVEL: TACROLIMUS (FK506) - LABCORP: 8.2 ng/mL (ref 2.0–20.0)

## 2017-10-08 LAB — URINALYSIS
BILIRUBIN UA: NEGATIVE
BLOOD UA: NEGATIVE
GLUCOSE UA: NEGATIVE
KETONES UA: NEGATIVE
LEUKOCYTE ESTERASE UA: NEGATIVE
NITRITE UA: NEGATIVE
PH UA: 6.5 (ref 5.0–9.0)
PROTEIN UA: 30 — AB
RBC UA: 1 /HPF (ref <=3)
SPECIFIC GRAVITY UA: 1.011 (ref 1.003–1.030)
SQUAMOUS EPITHELIAL: <1 /HPF (ref 0–5)
UROBILINOGEN UA: 0.2

## 2017-10-08 LAB — CLARITY

## 2017-10-08 LAB — PROTEIN/CREAT RATIO, URINE: Protein/Creatinine:MRto:Pt:Urine:Qn:: 1.243

## 2017-10-08 LAB — PROTEIN / CREATININE RATIO, URINE: CREATININE, URINE: 101.4 mg/dL

## 2017-10-08 MED ORDER — MAGNESIUM OXIDE-MAGNESIUM AMINO ACID CHELATE 133 MG TABLET
ORAL_TABLET | Freq: Two times a day (BID) | ORAL | 11 refills | 0.00000 days | Status: CP
Start: 2017-10-08 — End: 2017-11-05

## 2017-10-08 MED ORDER — VALGANCICLOVIR 450 MG TABLET
ORAL_TABLET | 2 refills | 0 days
Start: 2017-10-08 — End: 2017-10-08

## 2017-10-08 MED ORDER — SODIUM BICARBONATE 650 MG TABLET
ORAL_TABLET | Freq: Two times a day (BID) | ORAL | 11 refills | 0.00000 days
Start: 2017-10-08 — End: 2017-11-05

## 2017-10-08 MED ORDER — SULFAMETHOXAZOLE 400 MG-TRIMETHOPRIM 80 MG TABLET
ORAL_TABLET | 0 refills | 0 days
Start: 2017-10-08 — End: 2018-03-24

## 2017-10-08 MED ORDER — ONDANSETRON HCL 4 MG TABLET
ORAL_TABLET | Freq: Three times a day (TID) | ORAL | 2 refills | 0.00000 days | Status: CP | PRN
Start: 2017-10-08 — End: 2017-11-05

## 2017-10-08 MED ORDER — ONDANSETRON HCL 4 MG TABLET: each | 2 refills | 0 days

## 2017-10-08 MED ORDER — MAGNESIUM OXIDE-MAGNESIUM AMINO ACID CHELATE 133 MG TABLET: 1 | tablet | Freq: Two times a day (BID) | 11 refills | 0 days | Status: AC

## 2017-10-08 MED FILL — MYFORTIC/180MG/TAB: MYFORTIC/180MG/TAB | 30 days supply | Qty: 180 | Fill #0

## 2017-10-08 MED FILL — ONDANSETRON/4MG/TAB: ONDANSETRON/4MG/TAB | 5 days supply | Qty: 15 | Fill #0

## 2017-10-08 MED FILL — SULFAMETHOXAZOLE/TRIMETHO/400-80MG/TABS: SULFAMETHOXAZOLE/TRIMETHO/400-80MG/TABS | 30 days supply | Qty: 12 | Fill #0

## 2017-10-08 MED FILL — VALGANCICLOVIR/450MG/TABS: VALGANCICLOVIR/450MG/TABS | 30 days supply | Qty: 30 | Fill #0

## 2017-10-08 MED FILL — AMLODIPINE BESYLATE/10MG/TABS: AMLODIPINE BESYLATE/10MG/TABS | 90 days supply | Qty: 90 | Fill #0

## 2017-10-08 MED FILL — CARVEDILOL/6.25MG/TABS: CARVEDILOL/6.25MG/TABS | 30 days supply | Qty: 60 | Fill #0

## 2017-10-08 NOTE — Unmapped (Signed)
[facility]IT[facility] TRANSPLANT CLINIC PHARMACY NOTE  10/08/2017   Justin Mcknight [facility]  [id number]    Medication changes today:   1. Increase Valcyte to 450 mg Mon/Wed/Fri (to coincide with Bactrim frequency)  2. Start Bactrim SS Mon/Wed/Fri  3. Decrease sodium bicarbonate to 650 mg twice daily  4. Start ondansetron 4 mg q8h PRN for nausea  5. Stop carvedilol  6. Stop famotidine  7. Stop tramadol    Education/Adherence tools provided today:  1.provided updated medication list  2. provided additional pill box education  3.  facilitated medication access for Bactrim and mag plus protein  4.  provided additional education on immunosuppression and transplant related medications including reviewing indications of medications, dosing and side effects  5. provided additional education on importance of increased water intake and not to drink water at night    Follow up items:  1. goal of understanding indications and dosing of immunosuppression medications  2. Lipid panel and need for statin  3. BP off of carvedilol  4. Iron panel  5. Assess need for Valcyte dose adjustment    Next visit with pharmacy in 1-3 months  ____________________________________________________________________    Justin Mcknight is a 62 y.o. 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     Other PMH significant for hypertension     Readmitted 3/7-09/13/17 post renal biopsy 2/2 acute hemoglobin drop and consider for bleeding.  Renal u/s showed perinephric hematomas.     Seen by pharmacy today for: medication management and pill box fill and adherence education; last seen by pharmacy first visit.    CC:  Patient complains of fatigue - attributes to not sleeping well 2/2 urinary frequency at night    There were no vitals filed for this visit.    Allergies   Allergen Reactions   ??? Codeine Swelling       All medications reviewed and updated.     Medication list includes revisions made during today???s encounter    Outpatient Encounter Prescriptions as of 10/08/2017   Medication Sig Dispense Refill   ??? acetaminophen (TYLENOL) 500 MG tablet Take 1-2 tablets (500-1,000 mg total) by mouth Three (3) times a day as needed for pain. 100 tablet 0   ??? amLODIPine (NORVASC) 10 MG tablet Take 1 tablet (10 mg total) by mouth daily. 90 tablet 4   ??? aspirin (ECOTRIN) 81 MG tablet Take 1 tablet (81 mg total) by mouth daily. HOLD until directed to start by your coordinator. 30 tablet 11   ??? carvedilol (COREG) 6.25 MG tablet Take 1 tablet (6.25 mg total) by mouth Two (2) times a day. 60 tablet 0   ??? famotidine (PEPCID) 20 MG tablet Take 1 tablet (20 mg total) by mouth daily. 30 tablet 0   ??? gabapentin (NEURONTIN) 100 MG capsule Take 1 capsule (100 mg total) by mouth nightly. for 12 days 12 capsule 0   ??? magnesium oxide-Mg AA chelate (MAGNESIUM, AMINO ACID CHELATE,) 133 mg Tab Take 1 tablet by mouth Two (2) times a day. HOLD until directed to start by your coordinator. 60 tablet 11   ??? MYFORTIC 180 mg EC tablet Take 3 tablets (540 mg total) by mouth Two (2) times a day. 180 tablet 11   ??? polyethylene glycol (MIRALAX) 17 gram packet Take 17 g by mouth daily as needed. 30 packet 0   ??? PROGRAF 1 mg capsule Take 4 caps in am, 4 caps in pm 300 capsule 11   ???  sodium bicarbonate 650 mg tablet Take 1 tablet (650 mg total) by mouth Three (3) times a day. 90 tablet 0   ??? sulfamethoxazole-trimethoprim (BACTRIM,SEPTRA) 400-80 mg per tablet Take 1 tablet (80 mg of trimethoprim total) by mouth Every Monday, Wednesday, and Friday. HOLD until directed to start by your coordinator. 12 tablet 5   ??? traMADol (ULTRAM) 50 mg tablet Take 50 mg by mouth every twelve (12) hours as needed for pain.     ??? valGANciclovir (VALCYTE) 450 mg tablet Take 1 tablet (450 mg total) by mouth daily. 30 tablet 2   ??? [DISCONTINUED] amLODIPine (NORVASC) 10 MG tablet Take 10 mg by mouth daily.       No facility-administered encounter medications on file as of 10/08/2017.        Induction agent : alemtuzumab CURRENT IMMUNOSUPPRESSION: tacrolimus 4 mg PO BID  prograf/cyclosporine goal: 6-8 (lower goal in light of TMA)   myfortic540  mg PO bid    steroid free     Patient complains of tremor - mild    IMMUNOSUPPRESSION DRUG LEVELS:  Tacrolimus level on 10/04/17 was 7.1   Lab Results   Component Value Date    TACROLIMUS 6.8 09/27/2017    TACROLIMUS 11.8 09/25/2017    TACROLIMUS 10.0 09/23/2017     No results found for: CYCLO  No results found for: EVEROLIMUS  No results found for: SIROLIMUS    Prograf level is accurate 12 hour trough    Graft function: improving  DSA: ntd  Biopsies to date: 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 Continue to monitor.    ID prophylaxis:   CMV Status: D+/ R+, moderate risk . CMV prophylaxis: Valcyte 450 mg Mon/Thu x 3 months per protocol (End 12/01/17)  No results found for: CMVCP  PCP Prophylaxis: Received pentamidine dose x1 on 3/1/9 prior to hospital discharge (Bactrim was held for thrombocytopenia) x 6 months.  Thrush: not completed inpatient 2/2 nystatin shortage  Patient is  tolerating infectious prophylaxis well    Plan: Renally adjust Valcyte to MWF (frequency to coincide with MWF Bactrim)for CrCl of 31 ml/min. Continue to monitor.    CV Prophylaxis: Aspirin 81 mg held for thrombocytopenia on discharge from transplant and further held for hematoma post kidney biopsy  The 10-year ASCVD risk score Justin George DC Jr., et al., 2013) is: 11.3%  Statin therapy: Indicated; currently on no statin  Plan: check lipid panel and consider starting statin at later date; also consider resuming aspirin. Continue to monitor     BP: Goal < 140/90. Clinic vitals reported above  Home BP ranges: checks ~twice weekly and BP is always <120/85  Current meds include: carvedilol 6.25 mg once daily  Plan: Stop carvedilol. Continue to monitor to see if fatigue improves     Anemia:  H/H:   Lab Results   Component Value Date    HGB 9.3 (L) 10/04/2017 Lab Results   Component Value Date    HCT 27.8 (L) 10/04/2017     Iron panel:  Lab Results   Component Value Date    IRON 35 11/03/2012    TIBC 261 11/03/2012    FERRITIN 860 (H) 11/03/2012     Lab Results   Component Value Date    LABIRON 13 (L) 11/03/2012     Prior ESA use: none post transplant    Plan: stablely low.  Check iron panel with next labs. If  fatigue and anemia persist could consider ESA.  Continue to monitor.     DM:   Lab Results   Component Value Date    A1C 5.1 09/03/2017   . Goal A1c < 7  History of Dm? No  Established with endocrinologist/PCP for BG managment? No   Fasting BG: have been in in 120s-130s.  Patient met with dietitian at clinic and reviewed limitation of carbohydrates and sweetened beverages as diet may be contributing to borderline FBG.  Diet: Eating well per patient.   Exercise:Has not been exercising 2/2 fatigue  Hypoglycemia: no  Plan:  Monitor FBG; if continue to be borderline elevated may need to start checking qAM (has not checked BG before). Continue to monitor    Electrolytes: Mag is low at 1.4  Meds currently on: sodium bicarbonate 650 mg TID   Plan: Decrease sodium bicarb to BID limit TID dosing; start mag plus protein 1 tab BID.  Was counseled about increasing intake of high magnesium foods by dietitian.  Continue to monitor     GI/BM: pt reports 2 formed BM daily.  Also reports frequent nausea unrelieved by famotidine.  Reports no GERD symptoms  Meds currently on: Miralax PRN, colace PRN, famotidine PRN  Plan: Stop pepcid and start ondansetron 4 mg q8h PRN . Continue to monitor    Pain: pt reports mild pain at his incision site  Meds currently on: tramadol 50 bid prn (takes 2-3x's per week); takes APAP 500 mg PRN occasionally   Plan: Stop tramadol (could be contributing to drowsiness and daytime sleepiness) and use APAP PRN for pain. Continue to monitor    Bone health:   Vitamin D Level: pending. Goal > 30.   Last DEXA results:  none available  Current meds include: none Plan: Vitamin D level  needs to be drawn with next lab schedule . Continue to monitor.     Women's/Men's Health:  Justin Justin Mcknight is a 62 y.o. male. Patient reports no men's/women's health issues  Plan: Continue to monitor    Adherence: Patient has poor understanding of medications; was not able to independently identify names/doses of immunosuppressants and OI meds.  Patient  does not fill their own pill box on a regular basis at home.  His wife fills the pill box with immunosuppression and prophylaxis medications and patient takes the rest of the medications from his medication vials.   Patient brought medication card:yes  Pill GUY:[id number]  Valcyte was missing from Thursday pocket  Plan: Encouraged utilization of pill box for all medications and to only use orange card for administration instructions (had previously followed carvedilol instructions on medication vial).  Also stressed that any new medications must be approved by transplant coordinator.  Supervised patient's wife as she filled the pill box with additional medications (patient and wife aware that they will need to add Myfortic, magnesium, and Bactrim at home once meds are received from pharmacy).  Provided extensive adherence counseling/intervention    Spent approximately 60 minutes on educating this patient and greater than 50% was spent in direct face to face counseling regarding post transplant medication education. Questions and concerns were address to patient's satisfaction.    Patient was reviewed with Dr. Toni Arthurs who was agreement with the stated plan:     During this visit, the following was completed:   BG log data assessment  BP log data assessment  Labs ordered and evaluated  complex treatment plan >1 DS   Patient education was completed for 25-60 minutes  All questions/concerns were addressed to the patient's satisfaction.  __________________________________________  Cleone Slim, PHARMD  [employer] TRANSPLANT  PAGER [phone]

## 2017-10-08 NOTE — Unmapped (Signed)
Try to limit juice and sweetened beverages  Eat 3 meals daily with appropriate protein and add foods high in magnesium  Drink at least 80 ounces of fluids daily  Exercise when you are able    [place]:    Trate de limitar los jugos o bebidas altas en azucar  Coma 3 veces al dia con suficiente proteina y coma comidas altas en magnesio  Beba por lo menos 80 onzas de agua al dia  Haga ejercicio cuando pueda.

## 2017-10-08 NOTE — Unmapped (Signed)
Pt. Seen in clinic by Dr. Toni Arthurs. RLQ abdominal incision well approximated. Staples removed and steri strips applied.

## 2017-10-09 ENCOUNTER — Other Ambulatory Visit
Admission: RE | Admit: 2017-10-09 | Discharge: 2017-10-09 | Disposition: A | Discharge status: Home / Self Care | Payer: Medicare Other | Source: Intra-hospital | Attending: Nephrology | Admitting: Nephrology

## 2017-10-09 DIAGNOSIS — Z79899 Other long term (current) drug therapy: Secondary | ICD-10-CM | POA: Diagnosis not present

## 2017-10-09 DIAGNOSIS — N390 Urinary tract infection, site not specified: Secondary | ICD-10-CM | POA: Insufficient documentation

## 2017-10-09 DIAGNOSIS — D631 Anemia in chronic kidney disease: Secondary | ICD-10-CM | POA: Diagnosis not present

## 2017-10-09 DIAGNOSIS — Z940 Kidney transplant status: Secondary | ICD-10-CM | POA: Diagnosis not present

## 2017-10-09 DIAGNOSIS — D899 Disorder involving the immune mechanism, unspecified: Secondary | ICD-10-CM | POA: Insufficient documentation

## 2017-10-09 DIAGNOSIS — Z9483 Pancreas transplant status: Secondary | ICD-10-CM | POA: Insufficient documentation

## 2017-10-09 DIAGNOSIS — Z09 Encounter for follow-up examination after completed treatment for conditions other than malignant neoplasm: Secondary | ICD-10-CM | POA: Diagnosis not present

## 2017-10-09 DIAGNOSIS — B269 Mumps without complication: Secondary | ICD-10-CM | POA: Diagnosis not present

## 2017-10-09 DIAGNOSIS — T8610 Unspecified complication of kidney transplant: Secondary | ICD-10-CM | POA: Diagnosis not present

## 2017-10-09 DIAGNOSIS — Z789 Other specified health status: Secondary | ICD-10-CM | POA: Diagnosis not present

## 2017-10-09 DIAGNOSIS — E1129 Type 2 diabetes mellitus with other diabetic kidney complication: Secondary | ICD-10-CM | POA: Insufficient documentation

## 2017-10-09 DIAGNOSIS — Z114 Encounter for screening for human immunodeficiency virus [HIV]: Secondary | ICD-10-CM | POA: Insufficient documentation

## 2017-10-09 LAB — BASIC METABOLIC PANEL
Anion gap: 10 (ref 5–15)
BLOOD UREA NITROGEN: 37 mg/dL — ABNORMAL HIGH
BUN: 47 mg/dL — ABNORMAL HIGH (ref 6–20)
CALCIUM: 10.1 mg/dL
CALCIUM: 9.6 mg/dL (ref 8.9–10.3)
CHLORIDE: 102 mmol/L
CO2: 25 mmol/L
CO2: 25 mmol/L (ref 22–32)
CREATININE: 2.7 mg/dL — ABNORMAL HIGH
CREATININE: 3.03 mg/dL — AB (ref 0.61–1.24)
Chloride: 101 mmol/L (ref 101–111)
EGFR MDRD NON AF AMER: 24 mL/min/{1.73_m2} — ABNORMAL LOW
GFR, EST AFRICAN AMERICAN: 24 mL/min — AB (ref >60)
GFR, EST NON AFRICAN AMERICAN: 21 mL/min — AB (ref >60)
Glucose, Bld: 134 mg/dL — ABNORMAL HIGH (ref 65–99)
POTASSIUM: 5 mmol/L
Potassium: 4.8 mmol/L (ref 3.5–5.1)
Sodium: 136 mmol/L (ref 135–145)

## 2017-10-09 LAB — MAGNESIUM
Lab: 1.4 — ABNORMAL LOW
Magnesium: 1.6 mg/dL — ABNORMAL LOW (ref 1.7–2.4)

## 2017-10-09 LAB — CBC WITH DIFFERENTIAL/PLATELET
Basophils Absolute: 0.1 10*3/uL (ref 0–0.1)
Basophils Relative: 1 %
EOS ABS: 0 10*3/uL (ref 0–0.7)
EOS PCT: 1 %
HCT: 29.8 % — ABNORMAL LOW (ref 40.0–52.0)
Hemoglobin: 9.8 g/dL — ABNORMAL LOW (ref 13.0–18.0)
LYMPHS ABS: 0.1 10*3/uL — AB (ref 1.0–3.6)
Lymphocytes Relative: 1 %
MCH: 31.1 pg (ref 26.0–34.0)
MCHC: 32.9 g/dL (ref 32.0–36.0)
MCV: 94.4 fL (ref 80.0–100.0)
MONO ABS: 0.4 10*3/uL (ref 0.2–1.0)
Monocytes Relative: 5 %
Neutro Abs: 6.6 10*3/uL — ABNORMAL HIGH (ref 1.4–6.5)
Neutrophils Relative %: 92 %
PLATELETS: 368 10*3/uL (ref 150–440)
RBC: 3.15 MIL/uL — AB (ref 4.40–5.90)
RDW: 14.7 % — AB (ref 11.5–14.5)
WBC: 7.2 10*3/uL (ref 3.8–10.6)

## 2017-10-09 LAB — PHOSPHORUS
Lab: 3
Phosphorus: 3.2 mg/dL (ref 2.5–4.6)

## 2017-10-09 LAB — CBC W/ DIFFERENTIAL
BASOPHILS ABSOLUTE COUNT: 0.1 10*9/L
BASOPHILS RELATIVE PERCENT: 1 %
EOSINOPHILS ABSOLUTE COUNT: 0.1 10*9/L
EOSINOPHILS RELATIVE PERCENT: 1 %
HEMATOCRIT: 30.9 % — ABNORMAL LOW
HEMOGLOBIN: 10.2 g/dL — ABNORMAL LOW
LYMPHOCYTES ABSOLUTE COUNT: 0.1 10*9/L — ABNORMAL LOW
LYMPHOCYTES RELATIVE PERCENT: 1 %
MEAN CORPUSCULAR HEMOGLOBIN: 31.6 pg
MONOCYTES ABSOLUTE COUNT: 0.4 10*9/L
MONOCYTES RELATIVE PERCENT: 5 %
NEUTROPHILS RELATIVE PERCENT: 92 %
PLATELET COUNT: 424 10*9/L
RED BLOOD CELL COUNT: 3.23 10*12/L — ABNORMAL LOW
RED CELL DISTRIBUTION WIDTH: 15.1 % — ABNORMAL HIGH
WHITE BLOOD CELL COUNT: 8.9 10*9/L

## 2017-10-09 LAB — HEMATOCRIT: Lab: 30.9 — ABNORMAL LOW

## 2017-10-09 LAB — EGFR MDRD AF AMER: Lab: 0

## 2017-10-09 MED ORDER — SULFAMETHOXAZOLE 400 MG-TRIMETHOPRIM 80 MG TABLET
ORAL_TABLET | ORAL | 1 refills | 0 days | Status: CP
Start: 2017-10-09 — End: 2017-10-09

## 2017-10-09 MED ORDER — VALGANCICLOVIR 450 MG TABLET
ORAL_TABLET | ORAL | 2 refills | 0 days | Status: CP
Start: 2017-10-09 — End: 2017-12-04

## 2017-10-11 ENCOUNTER — Ambulatory Visit: Admit: 2017-10-11 | Discharge: 2017-10-12 | Payer: MEDICARE | Attending: Urology | Primary: Urology

## 2017-10-11 DIAGNOSIS — Z940 Kidney transplant status: Secondary | ICD-10-CM

## 2017-10-11 DIAGNOSIS — Z79899 Other long term (current) drug therapy: Secondary | ICD-10-CM

## 2017-10-11 DIAGNOSIS — Z1159 Encounter for screening for other viral diseases: Secondary | ICD-10-CM

## 2017-10-11 DIAGNOSIS — Z114 Encounter for screening for human immunodeficiency virus [HIV]: Secondary | ICD-10-CM

## 2017-10-11 LAB — TACROLIMUS LEVEL: TACROLIMUS (FK506) - LABCORP: 11.1 ng/mL (ref 2.0–20.0)

## 2017-10-11 LAB — CBC W/ AUTO DIFF
BASOPHILS ABSOLUTE COUNT: 0.1 10*9/L (ref 0.0–0.1)
BASOPHILS RELATIVE PERCENT: 0.7 %
EOSINOPHILS ABSOLUTE COUNT: 0 10*9/L (ref 0.0–0.4)
EOSINOPHILS RELATIVE PERCENT: 0.5 %
HEMATOCRIT: 28.5 % — ABNORMAL LOW (ref 41.0–53.0)
HEMOGLOBIN: 9.3 g/dL — ABNORMAL LOW (ref 13.5–17.5)
LYMPHOCYTES ABSOLUTE COUNT: 0.1 10*9/L — ABNORMAL LOW (ref 1.5–5.0)
LYMPHOCYTES RELATIVE PERCENT: 1.8 %
MEAN CORPUSCULAR HEMOGLOBIN CONC: 32.5 g/dL (ref 31.0–37.0)
MEAN CORPUSCULAR HEMOGLOBIN: 31.9 pg (ref 26.0–34.0)
MEAN CORPUSCULAR VOLUME: 98.2 fL (ref 80.0–100.0)
MEAN PLATELET VOLUME: 8 fL (ref 7.0–10.0)
MONOCYTES ABSOLUTE COUNT: 0.2 10*9/L (ref 0.2–0.8)
MONOCYTES RELATIVE PERCENT: 3.3 %
NEUTROPHILS ABSOLUTE COUNT: 7 10*9/L (ref 2.0–7.5)
NEUTROPHILS RELATIVE PERCENT: 93.2 %
RED BLOOD CELL COUNT: 2.9 10*12/L — ABNORMAL LOW (ref 4.50–5.90)
RED CELL DISTRIBUTION WIDTH: 16.2 % — ABNORMAL HIGH (ref 12.0–15.0)
WBC ADJUSTED: 7.5 10*9/L (ref 4.5–11.0)

## 2017-10-11 LAB — PHOSPHORUS: Phosphate:MCnc:Pt:Ser/Plas:Qn:: 3.5

## 2017-10-11 LAB — COMPREHENSIVE METABOLIC PANEL
ALBUMIN: 4.4 g/dL (ref 3.5–5.0)
ALKALINE PHOSPHATASE: 89 U/L (ref 38–126)
ALT (SGPT): 23 U/L (ref 19–72)
AST (SGOT): 19 U/L (ref 19–55)
BILIRUBIN TOTAL: 0.3 mg/dL (ref 0.0–1.2)
BLOOD UREA NITROGEN: 46 mg/dL — ABNORMAL HIGH (ref 7–21)
BUN / CREAT RATIO: 14
CALCIUM: 10.2 mg/dL (ref 8.5–10.2)
CO2: 25 mmol/L (ref 22.0–30.0)
CREATININE: 3.22 mg/dL — ABNORMAL HIGH (ref 0.70–1.30)
EGFR MDRD AF AMER: 24 mL/min/{1.73_m2} — ABNORMAL LOW (ref >=60)
EGFR MDRD NON AF AMER: 20 mL/min/{1.73_m2} — ABNORMAL LOW (ref >=60)
GLUCOSE RANDOM: 117 mg/dL — ABNORMAL HIGH (ref 65–99)
POTASSIUM: 5.4 mmol/L — ABNORMAL HIGH (ref 3.5–5.0)
PROTEIN TOTAL: 7.2 g/dL (ref 6.5–8.3)
SODIUM: 139 mmol/L (ref 135–145)

## 2017-10-11 LAB — BLOOD UREA NITROGEN: Urea nitrogen:MCnc:Pt:Ser/Plas:Qn:: 46 — ABNORMAL HIGH

## 2017-10-11 LAB — CMV DNA, QUANTITATIVE, PCR: CMV VIRAL LD: NOT DETECTED

## 2017-10-11 LAB — MAGNESIUM: Magnesium:MCnc:Pt:Ser/Plas:Qn:: 1.5 — ABNORMAL LOW

## 2017-10-11 LAB — HIV ANTIGEN/ANTIBODY COMBO: HIV 1+2 Ab+HIV1 p24 Ag:PrThr:Pt:Ser/Plas:Ord:IA: NONREACTIVE

## 2017-10-11 LAB — HEPATITIS B SURFACE ANTIGEN: Hepatitis B virus surface Ag:PrThr:Pt:Ser:Ord:: NONREACTIVE

## 2017-10-11 LAB — CMV QUANT LOG10: Lab: 0

## 2017-10-11 LAB — LARGE UNSTAINED CELLS: Lab: 1

## 2017-10-11 LAB — TACROLIMUS, TROUGH: Lab: 6.8

## 2017-10-11 NOTE — Unmapped (Signed)
[facility]:  Taking Care of Yourself After Cystoscopy Procedures    *Drink plenty of water for a day or two following your procedure.  Try to have about 8 ounces (one cup) at a time, and do this 6 times or more per day.  (If you have fluid restrictions, please ask the nurse or doctor for advice).    *AVOID alcoholic, carbonated and caffeinated drinks for a day or two, as they may cause uncomfortable symptoms.    *For the first 8 hours after the procedure, your urine may be pink or red in color.  Small clots or a few drops of blood can be a normal side effect of the instruments.  Large amounts of bleeding or difficulty urinating are not normal.  Call your doctor if this happens.    *You may experience some mild discomfort of a burning sensation with urination after having this procedure.  If it does not improve, or if other symptoms appear (fever, chills, or difficulty emptying), call your doctor.    *You may return to normal daily activities such as work, school, driving, exercising and housework.    *If your doctor gave you a prescription, take it as ordered.    *If you need a return appointment, the secretary will make it for you when you check out. To contact the Urology Clinic during business hours, call [phone].    *[facility] Operator can be reached at [phone] if you need to get in contact with your doctor.  After the hours, the operator can page the doctor on call for urgent concerns:    Urology Patients should ask for the Urology resident “on call”.    You can get more immediate assistance at the Emergency Room or Urgent Care if necessary.

## 2017-10-11 NOTE — Unmapped (Signed)
[id number] cystoscope used for procedure. Darleen Crocker, CMA

## 2017-10-12 NOTE — Unmapped (Signed)
Indications: indwelling JJ stent following renal transplant    Procedure:  Cystoscopy, ureteral stent removal  Timeout was performed immediately prior to the procedure.    The patient was prepped and draped in the usual sterile fashion.  Flexible cystoscopy was performed.  The indwelling transplant ureteral stent was visualized, grasped, and removed intact.  The patient tolerated the procedure well and will continue on his routine post transplant antibiotics.    Plan: Notify us or coordinator with concerns

## 2017-10-14 ENCOUNTER — Other Ambulatory Visit
Admission: RE | Admit: 2017-10-14 | Discharge: 2017-10-14 | Disposition: A | Discharge status: Home / Self Care | Payer: Medicare Other | Source: Ambulatory Visit | Attending: Family Medicine | Admitting: Family Medicine

## 2017-10-14 DIAGNOSIS — Z940 Kidney transplant status: Secondary | ICD-10-CM | POA: Diagnosis present

## 2017-10-14 DIAGNOSIS — Z79899 Other long term (current) drug therapy: Secondary | ICD-10-CM | POA: Insufficient documentation

## 2017-10-14 DIAGNOSIS — Z09 Encounter for follow-up examination after completed treatment for conditions other than malignant neoplasm: Secondary | ICD-10-CM | POA: Diagnosis present

## 2017-10-14 DIAGNOSIS — D631 Anemia in chronic kidney disease: Secondary | ICD-10-CM | POA: Diagnosis not present

## 2017-10-14 DIAGNOSIS — N390 Urinary tract infection, site not specified: Secondary | ICD-10-CM | POA: Diagnosis not present

## 2017-10-14 DIAGNOSIS — E669 Obesity, unspecified: Secondary | ICD-10-CM | POA: Diagnosis not present

## 2017-10-14 DIAGNOSIS — B259 Cytomegaloviral disease, unspecified: Secondary | ICD-10-CM | POA: Insufficient documentation

## 2017-10-14 DIAGNOSIS — E1129 Type 2 diabetes mellitus with other diabetic kidney complication: Secondary | ICD-10-CM | POA: Diagnosis not present

## 2017-10-14 DIAGNOSIS — Z9483 Pancreas transplant status: Secondary | ICD-10-CM | POA: Diagnosis not present

## 2017-10-14 DIAGNOSIS — Z789 Other specified health status: Secondary | ICD-10-CM | POA: Diagnosis not present

## 2017-10-14 LAB — CBC WITH DIFFERENTIAL/PLATELET
BASOS ABS: 0.1 10*3/uL (ref 0–0.1)
Basophils Relative: 1 %
Eosinophils Absolute: 0 10*3/uL (ref 0–0.7)
Eosinophils Relative: 1 %
HEMATOCRIT: 29.2 % — AB (ref 40.0–52.0)
Hemoglobin: 9.9 g/dL — ABNORMAL LOW (ref 13.0–18.0)
LYMPHS ABS: 0.1 10*3/uL — AB (ref 1.0–3.6)
Lymphocytes Relative: 2 %
MCH: 32 pg (ref 26.0–34.0)
MCHC: 33.8 g/dL (ref 32.0–36.0)
MCV: 94.8 fL (ref 80.0–100.0)
MONO ABS: 0.3 10*3/uL (ref 0.2–1.0)
Monocytes Relative: 5 %
NEUTROS ABS: 5.6 10*3/uL (ref 1.4–6.5)
Neutrophils Relative %: 91 %
Platelets: 289 10*3/uL (ref 150–440)
RBC: 3.08 MIL/uL — ABNORMAL LOW (ref 4.40–5.90)
RDW: 16.3 % — AB (ref 11.5–14.5)
WBC: 6.2 10*3/uL (ref 3.8–10.6)

## 2017-10-14 LAB — BASIC METABOLIC PANEL
Anion gap: 7 (ref 5–15)
BUN: 38 mg/dL — AB (ref 6–20)
CALCIUM: 9.9 mg/dL (ref 8.9–10.3)
CHLORIDE: 101 mmol/L
CHLORIDE: 105 mmol/L (ref 101–111)
CO2: 25 mmol/L
CO2: 25 mmol/L (ref 22–32)
CREATININE: 3.03 mg/dL — ABNORMAL HIGH
CREATININE: 2.77 mg/dL — AB (ref 0.61–1.24)
EGFR MDRD NON AF AMER: 21 mL/min/{1.73_m2} — ABNORMAL LOW
GFR calc Af Amer: 27 mL/min — ABNORMAL LOW (ref >60)
GFR calc non Af Amer: 23 mL/min — ABNORMAL LOW (ref >60)
GLUCOSE RANDOM: 134 mg/dL — ABNORMAL HIGH
GLUCOSE: 134 mg/dL — AB (ref 65–99)
POTASSIUM: 4.8 mmol/L
Potassium: 5.5 mmol/L — ABNORMAL HIGH (ref 3.5–5.1)
SODIUM: 136 mmol/L
Sodium: 137 mmol/L (ref 135–145)

## 2017-10-14 LAB — PHOSPHORUS
Lab: 3.2
Phosphorus: 3 mg/dL (ref 2.5–4.6)

## 2017-10-14 LAB — MAGNESIUM
Lab: 1.6 — ABNORMAL LOW
Magnesium: 1.7 mg/dL (ref 1.7–2.4)

## 2017-10-14 LAB — CBC W/ DIFFERENTIAL
BASOPHILS ABSOLUTE COUNT: 0.1 10*9/L
BASOPHILS RELATIVE PERCENT: 1 %
EOSINOPHILS ABSOLUTE COUNT: 0 10*9/L
EOSINOPHILS RELATIVE PERCENT: 1 %
HEMATOCRIT: 29.8 % — ABNORMAL LOW
HEMOGLOBIN: 9.8 g/dL — ABNORMAL LOW
LYMPHOCYTES ABSOLUTE COUNT: 0.1 10*9/L — ABNORMAL LOW
LYMPHOCYTES RELATIVE PERCENT: 1 %
MEAN CORPUSCULAR HEMOGLOBIN CONC: 32.9 g/dL
MEAN CORPUSCULAR HEMOGLOBIN: 31.1 pg
MEAN CORPUSCULAR VOLUME: 94.4 fL
MONOCYTES RELATIVE PERCENT: 5 %
NEUTROPHILS ABSOLUTE COUNT: 6.6 10*9/L — ABNORMAL HIGH
NEUTROPHILS RELATIVE PERCENT: 92 %
PLATELET COUNT: 368 10*9/L
RED BLOOD CELL COUNT: 3.15 10*12/L — ABNORMAL LOW
RED CELL DISTRIBUTION WIDTH: 14.7 % — ABNORMAL HIGH
WHITE BLOOD CELL COUNT: 7.2 10*9/L

## 2017-10-14 LAB — HEPATITIS C RNA, QUANTITATIVE, PCR

## 2017-10-14 LAB — HCV RNA COMMENT: Lab: 0

## 2017-10-14 LAB — HEMOGLOBIN: Lab: 9.8 — ABNORMAL LOW

## 2017-10-14 LAB — BLOOD UREA NITROGEN: Lab: 47 — ABNORMAL HIGH

## 2017-10-15 LAB — TACROLIMUS LEVEL: Tacrolimus (FK506) - LabCorp: 7.7 ng/mL (ref 2.0–20.0)

## 2017-10-16 ENCOUNTER — Other Ambulatory Visit
Admission: RE | Admit: 2017-10-16 | Discharge: 2017-10-16 | Disposition: A | Discharge status: Home / Self Care | Payer: Medicare Other | Source: Ambulatory Visit | Attending: Nephrology | Admitting: Nephrology

## 2017-10-16 DIAGNOSIS — N189 Chronic kidney disease, unspecified: Secondary | ICD-10-CM | POA: Insufficient documentation

## 2017-10-16 DIAGNOSIS — X58XXXA Exposure to other specified factors, initial encounter: Secondary | ICD-10-CM | POA: Diagnosis not present

## 2017-10-16 DIAGNOSIS — Z09 Encounter for follow-up examination after completed treatment for conditions other than malignant neoplasm: Secondary | ICD-10-CM | POA: Diagnosis not present

## 2017-10-16 DIAGNOSIS — Z79899 Other long term (current) drug therapy: Secondary | ICD-10-CM | POA: Diagnosis present

## 2017-10-16 DIAGNOSIS — T8610 Unspecified complication of kidney transplant: Secondary | ICD-10-CM | POA: Diagnosis not present

## 2017-10-16 DIAGNOSIS — N390 Urinary tract infection, site not specified: Secondary | ICD-10-CM | POA: Diagnosis not present

## 2017-10-16 DIAGNOSIS — Z114 Encounter for screening for human immunodeficiency virus [HIV]: Secondary | ICD-10-CM | POA: Diagnosis not present

## 2017-10-16 DIAGNOSIS — D631 Anemia in chronic kidney disease: Secondary | ICD-10-CM | POA: Insufficient documentation

## 2017-10-16 DIAGNOSIS — E559 Vitamin D deficiency, unspecified: Secondary | ICD-10-CM | POA: Diagnosis not present

## 2017-10-16 DIAGNOSIS — D890 Polyclonal hypergammaglobulinemia: Secondary | ICD-10-CM | POA: Diagnosis present

## 2017-10-16 DIAGNOSIS — E1129 Type 2 diabetes mellitus with other diabetic kidney complication: Secondary | ICD-10-CM | POA: Insufficient documentation

## 2017-10-16 DIAGNOSIS — B259 Cytomegaloviral disease, unspecified: Secondary | ICD-10-CM | POA: Insufficient documentation

## 2017-10-16 DIAGNOSIS — Z940 Kidney transplant status: Secondary | ICD-10-CM | POA: Diagnosis present

## 2017-10-16 DIAGNOSIS — Z789 Other specified health status: Secondary | ICD-10-CM | POA: Diagnosis not present

## 2017-10-16 DIAGNOSIS — Z9483 Pancreas transplant status: Secondary | ICD-10-CM | POA: Diagnosis not present

## 2017-10-16 LAB — CBC WITH DIFFERENTIAL/PLATELET
BASOS ABS: 0.1 10*3/uL (ref 0–0.1)
BASOS PCT: 1 %
Eosinophils Absolute: 0.1 10*3/uL (ref 0–0.7)
Eosinophils Relative: 1 %
HEMATOCRIT: 29.1 % — AB (ref 40.0–52.0)
Hemoglobin: 9.6 g/dL — ABNORMAL LOW (ref 13.0–18.0)
Lymphocytes Relative: 2 %
Lymphs Abs: 0.1 10*3/uL — ABNORMAL LOW (ref 1.0–3.6)
MCH: 31.4 pg (ref 26.0–34.0)
MCHC: 32.8 g/dL (ref 32.0–36.0)
MCV: 95.8 fL (ref 80.0–100.0)
MONO ABS: 0.3 10*3/uL (ref 0.2–1.0)
Monocytes Relative: 5 %
NEUTROS ABS: 5.4 10*3/uL (ref 1.4–6.5)
NEUTROS PCT: 91 %
Platelets: 284 10*3/uL (ref 150–440)
RBC: 3.04 MIL/uL — ABNORMAL LOW (ref 4.40–5.90)
RDW: 16.5 % — ABNORMAL HIGH (ref 11.5–14.5)
WBC: 6 10*3/uL (ref 3.8–10.6)

## 2017-10-16 LAB — MAGNESIUM: MAGNESIUM: 1.8 mg/dL (ref 1.7–2.4)

## 2017-10-16 LAB — BASIC METABOLIC PANEL
ANION GAP: 7 (ref 5–15)
BUN: 43 mg/dL — AB (ref 6–20)
CO2: 23 mmol/L (ref 22–32)
Calcium: 9.6 mg/dL (ref 8.9–10.3)
Chloride: 106 mmol/L (ref 101–111)
Creatinine, Ser: 3.33 mg/dL — ABNORMAL HIGH (ref 0.61–1.24)
GFR, EST AFRICAN AMERICAN: 22 mL/min — AB (ref >60)
GFR, EST NON AFRICAN AMERICAN: 19 mL/min — AB (ref >60)
Glucose, Bld: 135 mg/dL — ABNORMAL HIGH (ref 65–99)
Potassium: 4.8 mmol/L (ref 3.5–5.1)
SODIUM: 136 mmol/L (ref 135–145)

## 2017-10-16 LAB — PHOSPHORUS: PHOSPHORUS: 3.3 mg/dL (ref 2.5–4.6)

## 2017-10-17 LAB — CBC W/ DIFFERENTIAL
BASOPHILS ABSOLUTE COUNT: 0.1 10*9/L
BASOPHILS RELATIVE PERCENT: 1 %
EOSINOPHILS ABSOLUTE COUNT: 0.1 10*9/L
EOSINOPHILS RELATIVE PERCENT: 1 %
HEMATOCRIT: 29.1 % — ABNORMAL LOW
HEMOGLOBIN: 9.6 g/dL — ABNORMAL LOW
LYMPHOCYTES ABSOLUTE COUNT: 0.1 10*9/L — ABNORMAL LOW
LYMPHOCYTES RELATIVE PERCENT: 2 %
MEAN CORPUSCULAR HEMOGLOBIN CONC: 32.8 g/dL
MEAN CORPUSCULAR HEMOGLOBIN: 31.4 pg
MEAN CORPUSCULAR VOLUME: 95.8 fL
MONOCYTES RELATIVE PERCENT: 5 %
NEUTROPHILS ABSOLUTE COUNT: 5.4 10*9/L
NEUTROPHILS RELATIVE PERCENT: 91 %
PLATELET COUNT: 284 10*9/L
RED BLOOD CELL COUNT: 3.04 10*12/L — ABNORMAL LOW
RED CELL DISTRIBUTION WIDTH: 16.5 % — ABNORMAL HIGH
WHITE BLOOD CELL COUNT: 6 10*9/L

## 2017-10-17 LAB — BASIC METABOLIC PANEL
BLOOD UREA NITROGEN: 43 mg/dL — ABNORMAL HIGH
CALCIUM: 9.6 mg/dL
CHLORIDE: 106 mmol/L
CO2: 23 mmol/L
CREATININE: 3.33 mg/dL — ABNORMAL HIGH
EGFR MDRD NON AF AMER: 19 mL/min/{1.73_m2} — ABNORMAL LOW
POTASSIUM: 4.8 mmol/L
SODIUM: 136 mmol/L

## 2017-10-17 LAB — HYPOCHROMIA: Lab: 0

## 2017-10-17 LAB — MAGNESIUM: Lab: 1.8

## 2017-10-17 LAB — CALCIUM: Lab: 9.6

## 2017-10-17 LAB — PHOSPHORUS: Lab: 3.3

## 2017-10-18 ENCOUNTER — Other Ambulatory Visit
Admission: RE | Admit: 2017-10-18 | Discharge: 2017-10-18 | Disposition: A | Discharge status: Home / Self Care | Payer: Medicare Other | Source: Ambulatory Visit | Attending: Nephrology | Admitting: Nephrology

## 2017-10-18 DIAGNOSIS — Z789 Other specified health status: Secondary | ICD-10-CM | POA: Insufficient documentation

## 2017-10-18 DIAGNOSIS — E1129 Type 2 diabetes mellitus with other diabetic kidney complication: Secondary | ICD-10-CM | POA: Insufficient documentation

## 2017-10-18 DIAGNOSIS — Z9483 Pancreas transplant status: Secondary | ICD-10-CM | POA: Insufficient documentation

## 2017-10-18 DIAGNOSIS — Z09 Encounter for follow-up examination after completed treatment for conditions other than malignant neoplasm: Secondary | ICD-10-CM | POA: Insufficient documentation

## 2017-10-18 DIAGNOSIS — D631 Anemia in chronic kidney disease: Secondary | ICD-10-CM | POA: Insufficient documentation

## 2017-10-18 DIAGNOSIS — Z79899 Other long term (current) drug therapy: Secondary | ICD-10-CM | POA: Insufficient documentation

## 2017-10-18 DIAGNOSIS — Z114 Encounter for screening for human immunodeficiency virus [HIV]: Secondary | ICD-10-CM | POA: Insufficient documentation

## 2017-10-18 DIAGNOSIS — E559 Vitamin D deficiency, unspecified: Secondary | ICD-10-CM | POA: Insufficient documentation

## 2017-10-18 DIAGNOSIS — B259 Cytomegaloviral disease, unspecified: Secondary | ICD-10-CM | POA: Diagnosis not present

## 2017-10-18 DIAGNOSIS — D899 Disorder involving the immune mechanism, unspecified: Secondary | ICD-10-CM | POA: Diagnosis present

## 2017-10-18 DIAGNOSIS — Z940 Kidney transplant status: Secondary | ICD-10-CM | POA: Insufficient documentation

## 2017-10-18 DIAGNOSIS — N189 Chronic kidney disease, unspecified: Secondary | ICD-10-CM | POA: Diagnosis not present

## 2017-10-18 DIAGNOSIS — N390 Urinary tract infection, site not specified: Secondary | ICD-10-CM | POA: Diagnosis not present

## 2017-10-18 LAB — TACROLIMUS LEVEL: TACROLIMUS (FK506) - LABCORP: 9.8 ng/mL (ref 2.0–20.0)

## 2017-10-18 LAB — BASIC METABOLIC PANEL
Anion gap: 5 (ref 5–15)
BUN: 37 mg/dL — AB (ref 6–20)
CO2: 24 mmol/L (ref 22–32)
Calcium: 9.8 mg/dL (ref 8.9–10.3)
Chloride: 108 mmol/L (ref 101–111)
Creatinine, Ser: 2.81 mg/dL — ABNORMAL HIGH (ref 0.61–1.24)
GFR calc Af Amer: 27 mL/min — ABNORMAL LOW (ref >60)
GFR, EST NON AFRICAN AMERICAN: 23 mL/min — AB (ref >60)
Glucose, Bld: 133 mg/dL — ABNORMAL HIGH (ref 65–99)
POTASSIUM: 5.1 mmol/L (ref 3.5–5.1)
SODIUM: 137 mmol/L (ref 135–145)

## 2017-10-18 LAB — CBC WITH DIFFERENTIAL/PLATELET
Basophils Absolute: 0.1 10*3/uL (ref 0–0.1)
Basophils Relative: 2 %
EOS ABS: 0 10*3/uL (ref 0–0.7)
EOS PCT: 1 %
HCT: 29.9 % — ABNORMAL LOW (ref 40.0–52.0)
Hemoglobin: 10 g/dL — ABNORMAL LOW (ref 13.0–18.0)
Lymphocytes Relative: 2 %
Lymphs Abs: 0.1 10*3/uL — ABNORMAL LOW (ref 1.0–3.6)
MCH: 32.4 pg (ref 26.0–34.0)
MCHC: 33.5 g/dL (ref 32.0–36.0)
MCV: 96.7 fL (ref 80.0–100.0)
Monocytes Absolute: 0.2 10*3/uL (ref 0.2–1.0)
Monocytes Relative: 4 %
Neutro Abs: 4.4 10*3/uL (ref 1.4–6.5)
Neutrophils Relative %: 91 %
PLATELETS: 283 10*3/uL (ref 150–440)
RBC: 3.09 MIL/uL — AB (ref 4.40–5.90)
RDW: 16.9 % — ABNORMAL HIGH (ref 11.5–14.5)
WBC: 4.9 10*3/uL (ref 3.8–10.6)

## 2017-10-18 LAB — MAGNESIUM: Magnesium: 1.8 mg/dL (ref 1.7–2.4)

## 2017-10-18 LAB — PHOSPHORUS: PHOSPHORUS: 3.2 mg/dL (ref 2.5–4.6)

## 2017-10-19 LAB — TACROLIMUS LEVEL: Tacrolimus (FK506) - LabCorp: 2.6 ng/mL (ref 2.0–20.0)

## 2017-10-21 ENCOUNTER — Other Ambulatory Visit
Admission: RE | Admit: 2017-10-21 | Discharge: 2017-10-21 | Disposition: A | Discharge status: Home / Self Care | Payer: Medicare Other | Source: Ambulatory Visit | Attending: Nephrology | Admitting: Nephrology

## 2017-10-21 DIAGNOSIS — D631 Anemia in chronic kidney disease: Secondary | ICD-10-CM | POA: Diagnosis not present

## 2017-10-21 DIAGNOSIS — N390 Urinary tract infection, site not specified: Secondary | ICD-10-CM | POA: Insufficient documentation

## 2017-10-21 DIAGNOSIS — Z940 Kidney transplant status: Secondary | ICD-10-CM | POA: Insufficient documentation

## 2017-10-21 DIAGNOSIS — Z79899 Other long term (current) drug therapy: Secondary | ICD-10-CM | POA: Diagnosis not present

## 2017-10-21 DIAGNOSIS — Z789 Other specified health status: Secondary | ICD-10-CM | POA: Insufficient documentation

## 2017-10-21 DIAGNOSIS — Z114 Encounter for screening for human immunodeficiency virus [HIV]: Secondary | ICD-10-CM | POA: Diagnosis not present

## 2017-10-21 DIAGNOSIS — B259 Cytomegaloviral disease, unspecified: Secondary | ICD-10-CM | POA: Diagnosis not present

## 2017-10-21 DIAGNOSIS — E1129 Type 2 diabetes mellitus with other diabetic kidney complication: Secondary | ICD-10-CM | POA: Insufficient documentation

## 2017-10-21 DIAGNOSIS — D899 Disorder involving the immune mechanism, unspecified: Secondary | ICD-10-CM | POA: Diagnosis present

## 2017-10-21 DIAGNOSIS — Z09 Encounter for follow-up examination after completed treatment for conditions other than malignant neoplasm: Secondary | ICD-10-CM | POA: Insufficient documentation

## 2017-10-21 DIAGNOSIS — T8610 Unspecified complication of kidney transplant: Secondary | ICD-10-CM | POA: Insufficient documentation

## 2017-10-21 DIAGNOSIS — Z9483 Pancreas transplant status: Secondary | ICD-10-CM | POA: Insufficient documentation

## 2017-10-21 LAB — BASIC METABOLIC PANEL
ANION GAP: 6 (ref 5–15)
BUN: 32 mg/dL — ABNORMAL HIGH (ref 6–20)
CO2: 24 mmol/L (ref 22–32)
Calcium: 9.4 mg/dL (ref 8.9–10.3)
Chloride: 105 mmol/L (ref 101–111)
Creatinine, Ser: 2.39 mg/dL — ABNORMAL HIGH (ref 0.61–1.24)
GFR calc Af Amer: 32 mL/min — ABNORMAL LOW (ref >60)
GFR, EST NON AFRICAN AMERICAN: 28 mL/min — AB (ref >60)
Glucose, Bld: 123 mg/dL — ABNORMAL HIGH (ref 65–99)
POTASSIUM: 5 mmol/L (ref 3.5–5.1)
SODIUM: 135 mmol/L (ref 135–145)

## 2017-10-21 LAB — CBC WITH DIFFERENTIAL/PLATELET
BASOS PCT: 1 %
Basophils Absolute: 0 10*3/uL (ref 0–0.1)
EOS ABS: 0.1 10*3/uL (ref 0–0.7)
EOS PCT: 1 %
HCT: 29.5 % — ABNORMAL LOW (ref 40.0–52.0)
HEMOGLOBIN: 10 g/dL — AB (ref 13.0–18.0)
Lymphocytes Relative: 2 %
Lymphs Abs: 0.1 10*3/uL — ABNORMAL LOW (ref 1.0–3.6)
MCH: 32.9 pg (ref 26.0–34.0)
MCHC: 34 g/dL (ref 32.0–36.0)
MCV: 96.5 fL (ref 80.0–100.0)
Monocytes Absolute: 0.2 10*3/uL (ref 0.2–1.0)
Monocytes Relative: 5 %
NEUTROS PCT: 91 %
Neutro Abs: 3.9 10*3/uL (ref 1.4–6.5)
PLATELETS: 270 10*3/uL (ref 150–440)
RBC: 3.05 MIL/uL — AB (ref 4.40–5.90)
RDW: 16.8 % — ABNORMAL HIGH (ref 11.5–14.5)
WBC: 4.2 10*3/uL (ref 3.8–10.6)

## 2017-10-21 LAB — PHOSPHORUS: PHOSPHORUS: 3.4 mg/dL (ref 2.5–4.6)

## 2017-10-21 LAB — MAGNESIUM: Magnesium: 1.8 mg/dL (ref 1.7–2.4)

## 2017-10-21 LAB — HLA DS POST TRANSPLANT
ANTI-DONOR DRW #1 MFI: 0 MFI
ANTI-DONOR DRW #2 MFI: 86 MFI
ANTI-DONOR HLA-A #1 MFI: 46 MFI
ANTI-DONOR HLA-A #2 MFI: 0 MFI
ANTI-DONOR HLA-B #1 MFI: 24 MFI
ANTI-DONOR HLA-B #2 MFI: 45 MFI
ANTI-DONOR HLA-C #1 MFI: 138 MFI
ANTI-DONOR HLA-C #2 MFI: 99 MFI
ANTI-DONOR HLA-DP AG #1 MFI: 11 MFI
ANTI-DONOR HLA-DQB #1 MFI: 0 MFI
ANTI-DONOR HLA-DQB #2 MFI: 0 MFI
ANTI-DONOR HLA-DR #1 MFI: 0 MFI
DONOR DRW ANTIGEN #1: 51
DONOR DRW ANTIGEN #2: 53
DONOR HLA-A ANTIGEN #1: 2
DONOR HLA-A ANTIGEN #2: 3
DONOR HLA-B ANTIGEN #1: 7
DONOR HLA-B ANTIGEN #2: 44
DONOR HLA-C ANTIGEN #1: 2
DONOR HLA-C ANTIGEN #2: 7
DONOR HLA-DQB ANTIGEN #2: 5
DONOR HLA-DR ANTIGEN #1: 16
DONOR HLA-DR ANTIGEN #2: 4

## 2017-10-21 LAB — HLA CL2 AB RESULT: Lab: NEGATIVE

## 2017-10-21 LAB — DONOR HLA-C ANTIGEN #1: Lab: 2

## 2017-10-21 LAB — FSAB CLASS 1 ANTIBODY SPECIFICITY: HLA CLASS 1 ANTIBODY RESULT: NEGATIVE

## 2017-10-21 LAB — HLA CL1 ANTIBODY COMM: Lab: 0

## 2017-10-22 LAB — TACROLIMUS LEVEL: TACROLIMUS (FK506) - LABCORP: 10 ng/mL (ref 2.0–20.0)

## 2017-10-22 LAB — TACROLIMUS, TROUGH: Lab: 2.6

## 2017-10-22 LAB — CBC W/ DIFFERENTIAL
BASOPHILS ABSOLUTE COUNT: 0.1 10*9/L
BASOPHILS RELATIVE PERCENT: 2 %
EOSINOPHILS ABSOLUTE COUNT: 0 10*9/L
EOSINOPHILS RELATIVE PERCENT: 1 %
HEMATOCRIT: 29.9 % — ABNORMAL LOW
HEMOGLOBIN: 10 g/dL — ABNORMAL LOW
LYMPHOCYTES ABSOLUTE COUNT: 0.1 10*9/L — ABNORMAL LOW
LYMPHOCYTES RELATIVE PERCENT: 2 %
MEAN CORPUSCULAR HEMOGLOBIN CONC: 33.5 g/dL
MEAN CORPUSCULAR HEMOGLOBIN: 32.4 pg
MONOCYTES ABSOLUTE COUNT: 0.2 10*9/L
MONOCYTES RELATIVE PERCENT: 4 %
NEUTROPHILS ABSOLUTE COUNT: 4.4 10*9/L
NEUTROPHILS RELATIVE PERCENT: 91 %
PLATELET COUNT: 283 10*9/L
RED BLOOD CELL COUNT: 3.09 10*12/L — ABNORMAL LOW
RED CELL DISTRIBUTION WIDTH: 16.9 % — ABNORMAL HIGH
WHITE BLOOD CELL COUNT: 4.9 10*9/L

## 2017-10-22 LAB — BASIC METABOLIC PANEL
BLOOD UREA NITROGEN: 37 mg/dL — ABNORMAL HIGH
CALCIUM: 9.8 mg/dL
CHLORIDE: 108 mmol/L
EGFR MDRD NON AF AMER: 23 mL/min/{1.73_m2} — ABNORMAL LOW
GLUCOSE RANDOM: 133 mg/dL — ABNORMAL HIGH
POTASSIUM: 5.1 mmol/L
SODIUM: 137 mmol/L

## 2017-10-22 LAB — MAGNESIUM: Lab: 1.8

## 2017-10-22 LAB — LYMPHOCYTES RELATIVE PERCENT: Lab: 2

## 2017-10-22 LAB — CREATININE: Lab: 2.81 — ABNORMAL HIGH

## 2017-10-22 LAB — PHOSPHORUS: Lab: 3.2

## 2017-10-22 NOTE — Unmapped (Signed)
Pt request for RX Refill

## 2017-10-23 ENCOUNTER — Other Ambulatory Visit
Admission: RE | Admit: 2017-10-23 | Discharge: 2017-10-23 | Disposition: A | Discharge status: Home / Self Care | Payer: Medicare Other | Source: Ambulatory Visit | Attending: Nephrology | Admitting: Nephrology

## 2017-10-23 DIAGNOSIS — D899 Disorder involving the immune mechanism, unspecified: Secondary | ICD-10-CM | POA: Insufficient documentation

## 2017-10-23 DIAGNOSIS — Z114 Encounter for screening for human immunodeficiency virus [HIV]: Secondary | ICD-10-CM | POA: Insufficient documentation

## 2017-10-23 DIAGNOSIS — Z940 Kidney transplant status: Secondary | ICD-10-CM | POA: Insufficient documentation

## 2017-10-23 DIAGNOSIS — Z79899 Other long term (current) drug therapy: Secondary | ICD-10-CM | POA: Diagnosis not present

## 2017-10-23 DIAGNOSIS — E559 Vitamin D deficiency, unspecified: Secondary | ICD-10-CM | POA: Diagnosis not present

## 2017-10-23 DIAGNOSIS — N390 Urinary tract infection, site not specified: Secondary | ICD-10-CM | POA: Diagnosis not present

## 2017-10-23 DIAGNOSIS — Z789 Other specified health status: Secondary | ICD-10-CM | POA: Insufficient documentation

## 2017-10-23 DIAGNOSIS — E1129 Type 2 diabetes mellitus with other diabetic kidney complication: Secondary | ICD-10-CM | POA: Insufficient documentation

## 2017-10-23 DIAGNOSIS — T8610 Unspecified complication of kidney transplant: Secondary | ICD-10-CM | POA: Diagnosis not present

## 2017-10-23 DIAGNOSIS — X58XXXA Exposure to other specified factors, initial encounter: Secondary | ICD-10-CM | POA: Diagnosis not present

## 2017-10-23 DIAGNOSIS — D631 Anemia in chronic kidney disease: Secondary | ICD-10-CM | POA: Diagnosis not present

## 2017-10-23 DIAGNOSIS — Z9483 Pancreas transplant status: Secondary | ICD-10-CM | POA: Insufficient documentation

## 2017-10-23 DIAGNOSIS — B259 Cytomegaloviral disease, unspecified: Secondary | ICD-10-CM | POA: Diagnosis not present

## 2017-10-23 LAB — CBC WITH DIFFERENTIAL/PLATELET
Basophils Absolute: 0 10*3/uL (ref 0–0.1)
Basophils Relative: 1 %
EOS ABS: 0 10*3/uL (ref 0–0.7)
EOS PCT: 1 %
HCT: 31 % — ABNORMAL LOW (ref 40.0–52.0)
Hemoglobin: 10.4 g/dL — ABNORMAL LOW (ref 13.0–18.0)
LYMPHS ABS: 0.1 10*3/uL — AB (ref 1.0–3.6)
Lymphocytes Relative: 3 %
MCH: 32.9 pg (ref 26.0–34.0)
MCHC: 33.7 g/dL (ref 32.0–36.0)
MCV: 97.6 fL (ref 80.0–100.0)
Monocytes Absolute: 0.2 10*3/uL (ref 0.2–1.0)
Monocytes Relative: 4 %
Neutro Abs: 3.5 10*3/uL (ref 1.4–6.5)
Neutrophils Relative %: 91 %
Platelets: 252 10*3/uL (ref 150–440)
RBC: 3.17 MIL/uL — ABNORMAL LOW (ref 4.40–5.90)
RDW: 17.2 % — ABNORMAL HIGH (ref 11.5–14.5)
WBC: 3.9 10*3/uL (ref 3.8–10.6)

## 2017-10-23 LAB — BASIC METABOLIC PANEL
ANION GAP: 6 (ref 5–15)
BLOOD UREA NITROGEN: 36 mg/dL — ABNORMAL HIGH
BUN: 36 mg/dL — ABNORMAL HIGH (ref 6–20)
CALCIUM: 9.4 mg/dL
CALCIUM: 9.6 mg/dL
CHLORIDE: 106 mmol/L
CO2: 24 mmol/L
CO2: 26 mmol/L
CO2: 26 mmol/L (ref 22–32)
CREATININE: 2.57 mg/dL — ABNORMAL HIGH
Calcium: 9.6 mg/dL (ref 8.9–10.3)
Chloride: 106 mmol/L (ref 101–111)
Creatinine, Ser: 2.57 mg/dL — ABNORMAL HIGH (ref 0.61–1.24)
EGFR MDRD NON AF AMER: 28 mL/min/{1.73_m2} — ABNORMAL LOW
EGFR MDRD NON AF AMER: 26 mL/min/{1.73_m2} — ABNORMAL LOW
GFR calc Af Amer: 30 mL/min — ABNORMAL LOW (ref >60)
GFR, EST NON AFRICAN AMERICAN: 26 mL/min — AB (ref >60)
GLUCOSE RANDOM: 122 mg/dL — ABNORMAL HIGH
GLUCOSE: 122 mg/dL — AB (ref 65–99)
POTASSIUM: 5 mmol/L
POTASSIUM: 5.1 mmol/L
Potassium: 5.1 mmol/L (ref 3.5–5.1)
SODIUM: 135 mmol/L
Sodium: 138 mmol/L (ref 135–145)

## 2017-10-23 LAB — MAGNESIUM
Lab: 1.7
Lab: 1.8
Magnesium: 1.7 mg/dL (ref 1.7–2.4)

## 2017-10-23 LAB — PHOSPHORUS
Lab: 3.1
Lab: 3.4
Phosphorus: 3.1 mg/dL (ref 2.5–4.6)

## 2017-10-23 LAB — CBC W/ DIFFERENTIAL
BASOPHILS ABSOLUTE COUNT: 0 10*9/L
BASOPHILS ABSOLUTE COUNT: 0 10*9/L
BASOPHILS RELATIVE PERCENT: 1 %
BASOPHILS RELATIVE PERCENT: 1 %
EOSINOPHILS ABSOLUTE COUNT: 0 10*9/L
EOSINOPHILS RELATIVE PERCENT: 1 %
EOSINOPHILS RELATIVE PERCENT: 1 %
HEMATOCRIT: 29.5 % — ABNORMAL LOW
HEMATOCRIT: 31 % — ABNORMAL LOW
HEMOGLOBIN: 10 g/dL — ABNORMAL LOW
HEMOGLOBIN: 10.4 g/dL — ABNORMAL LOW
LYMPHOCYTES ABSOLUTE COUNT: 0.1 10*9/L — ABNORMAL LOW
LYMPHOCYTES RELATIVE PERCENT: 2 %
MEAN CORPUSCULAR HEMOGLOBIN CONC: 34 g/dL
MEAN CORPUSCULAR HEMOGLOBIN CONC: 33.7 g/dL
MEAN CORPUSCULAR HEMOGLOBIN: 32.9 pg
MEAN CORPUSCULAR HEMOGLOBIN: 32.9 pg
MEAN CORPUSCULAR VOLUME: 96.5 fL
MEAN CORPUSCULAR VOLUME: 97.6 fL
MONOCYTES ABSOLUTE COUNT: 0.2 10*9/L
MONOCYTES ABSOLUTE COUNT: 0.2 10*9/L
MONOCYTES RELATIVE PERCENT: 4 %
NEUTROPHILS ABSOLUTE COUNT: 3.9 10*9/L
NEUTROPHILS ABSOLUTE COUNT: 3.5 10*9/L
NEUTROPHILS RELATIVE PERCENT: 91 %
NEUTROPHILS RELATIVE PERCENT: 91 %
PLATELET COUNT: 270 10*9/L
PLATELET COUNT: 252 10*9/L
RED BLOOD CELL COUNT: 3.05 10*12/L — ABNORMAL LOW
RED BLOOD CELL COUNT: 3.17 10*12/L — ABNORMAL LOW
RED CELL DISTRIBUTION WIDTH: 17.2 % — ABNORMAL HIGH
WHITE BLOOD CELL COUNT: 4.2 10*9/L
WHITE BLOOD CELL COUNT: 3.9 10*9/L

## 2017-10-23 LAB — GLUCOSE RANDOM: Lab: 122 — ABNORMAL HIGH

## 2017-10-23 LAB — HBV DNA COMMENT: Lab: 0

## 2017-10-23 LAB — TACROLIMUS, TROUGH: Lab: 10

## 2017-10-23 LAB — SODIUM: Lab: 135

## 2017-10-23 LAB — NEUTROPHILS ABSOLUTE COUNT: Lab: 3.9

## 2017-10-23 LAB — MEAN PLATELET VOLUME: Lab: 0

## 2017-10-25 ENCOUNTER — Other Ambulatory Visit
Admission: RE | Admit: 2017-10-25 | Discharge: 2017-10-25 | Disposition: A | Discharge status: Home / Self Care | Payer: Medicare Other | Source: Ambulatory Visit | Attending: Nephrology | Admitting: Nephrology

## 2017-10-25 DIAGNOSIS — Z940 Kidney transplant status: Secondary | ICD-10-CM | POA: Diagnosis not present

## 2017-10-25 DIAGNOSIS — Z79899 Other long term (current) drug therapy: Secondary | ICD-10-CM | POA: Insufficient documentation

## 2017-10-25 DIAGNOSIS — D631 Anemia in chronic kidney disease: Secondary | ICD-10-CM | POA: Diagnosis not present

## 2017-10-25 DIAGNOSIS — Z9483 Pancreas transplant status: Secondary | ICD-10-CM | POA: Insufficient documentation

## 2017-10-25 DIAGNOSIS — D899 Disorder involving the immune mechanism, unspecified: Secondary | ICD-10-CM | POA: Diagnosis not present

## 2017-10-25 DIAGNOSIS — N189 Chronic kidney disease, unspecified: Secondary | ICD-10-CM | POA: Insufficient documentation

## 2017-10-25 DIAGNOSIS — E1129 Type 2 diabetes mellitus with other diabetic kidney complication: Secondary | ICD-10-CM | POA: Insufficient documentation

## 2017-10-25 LAB — CBC WITH DIFFERENTIAL/PLATELET
BASOS PCT: 0 %
Basophils Absolute: 0 10*3/uL (ref 0–0.1)
EOS PCT: 1 %
Eosinophils Absolute: 0 10*3/uL (ref 0–0.7)
HCT: 31.6 % — ABNORMAL LOW (ref 40.0–52.0)
Hemoglobin: 10.6 g/dL — ABNORMAL LOW (ref 13.0–18.0)
Lymphocytes Relative: 2 %
Lymphs Abs: 0.1 10*3/uL — ABNORMAL LOW (ref 1.0–3.6)
MCH: 33 pg (ref 26.0–34.0)
MCHC: 33.5 g/dL (ref 32.0–36.0)
MCV: 98.4 fL (ref 80.0–100.0)
MONOS PCT: 4 %
Monocytes Absolute: 0.1 10*3/uL — ABNORMAL LOW (ref 0.2–1.0)
Neutro Abs: 3.7 10*3/uL (ref 1.4–6.5)
Neutrophils Relative %: 93 %
PLATELETS: 238 10*3/uL (ref 150–440)
RBC: 3.21 MIL/uL — ABNORMAL LOW (ref 4.40–5.90)
RDW: 17.3 % — AB (ref 11.5–14.5)
WBC: 4 10*3/uL (ref 3.8–10.6)

## 2017-10-25 LAB — BASIC METABOLIC PANEL
Anion gap: 7 (ref 5–15)
BUN: 33 mg/dL — AB (ref 6–20)
CALCIUM: 9.7 mg/dL (ref 8.9–10.3)
CO2: 25 mmol/L (ref 22–32)
CREATININE: 2.4 mg/dL — AB (ref 0.61–1.24)
Chloride: 107 mmol/L (ref 101–111)
GFR calc non Af Amer: 28 mL/min — ABNORMAL LOW (ref >60)
GFR, EST AFRICAN AMERICAN: 32 mL/min — AB (ref >60)
Glucose, Bld: 125 mg/dL — ABNORMAL HIGH (ref 65–99)
Potassium: 5.5 mmol/L — ABNORMAL HIGH (ref 3.5–5.1)
SODIUM: 139 mmol/L (ref 135–145)

## 2017-10-25 LAB — MAGNESIUM: MAGNESIUM: 1.7 mg/dL (ref 1.7–2.4)

## 2017-10-25 LAB — PHOSPHORUS: PHOSPHORUS: 3.2 mg/dL (ref 2.5–4.6)

## 2017-10-25 LAB — TACROLIMUS LEVEL: TACROLIMUS (FK506) - LABCORP: 8.9 ng/mL (ref 2.0–20.0)

## 2017-10-27 LAB — TACROLIMUS LEVEL: TACROLIMUS (FK506) - LABCORP: 6.4 ng/mL (ref 2.0–20.0)

## 2017-10-28 ENCOUNTER — Other Ambulatory Visit
Admission: RE | Admit: 2017-10-28 | Discharge: 2017-10-28 | Disposition: A | Discharge status: Home / Self Care | Payer: Medicare Other | Source: Ambulatory Visit | Attending: Nephrology | Admitting: Nephrology

## 2017-10-28 DIAGNOSIS — N390 Urinary tract infection, site not specified: Secondary | ICD-10-CM | POA: Insufficient documentation

## 2017-10-28 DIAGNOSIS — D899 Disorder involving the immune mechanism, unspecified: Secondary | ICD-10-CM | POA: Diagnosis present

## 2017-10-28 DIAGNOSIS — Z940 Kidney transplant status: Secondary | ICD-10-CM | POA: Diagnosis not present

## 2017-10-28 DIAGNOSIS — Z114 Encounter for screening for human immunodeficiency virus [HIV]: Secondary | ICD-10-CM | POA: Insufficient documentation

## 2017-10-28 DIAGNOSIS — Z9483 Pancreas transplant status: Secondary | ICD-10-CM | POA: Diagnosis not present

## 2017-10-28 DIAGNOSIS — Z79899 Other long term (current) drug therapy: Secondary | ICD-10-CM | POA: Diagnosis not present

## 2017-10-28 DIAGNOSIS — Z09 Encounter for follow-up examination after completed treatment for conditions other than malignant neoplasm: Secondary | ICD-10-CM | POA: Diagnosis not present

## 2017-10-28 DIAGNOSIS — E559 Vitamin D deficiency, unspecified: Secondary | ICD-10-CM | POA: Insufficient documentation

## 2017-10-28 DIAGNOSIS — N189 Chronic kidney disease, unspecified: Secondary | ICD-10-CM | POA: Diagnosis not present

## 2017-10-28 DIAGNOSIS — E1129 Type 2 diabetes mellitus with other diabetic kidney complication: Secondary | ICD-10-CM | POA: Insufficient documentation

## 2017-10-28 DIAGNOSIS — B269 Mumps without complication: Secondary | ICD-10-CM | POA: Insufficient documentation

## 2017-10-28 DIAGNOSIS — Z789 Other specified health status: Secondary | ICD-10-CM | POA: Diagnosis not present

## 2017-10-28 DIAGNOSIS — T8610 Unspecified complication of kidney transplant: Secondary | ICD-10-CM | POA: Diagnosis not present

## 2017-10-28 DIAGNOSIS — D631 Anemia in chronic kidney disease: Secondary | ICD-10-CM | POA: Diagnosis not present

## 2017-10-28 DIAGNOSIS — X58XXXA Exposure to other specified factors, initial encounter: Secondary | ICD-10-CM | POA: Diagnosis not present

## 2017-10-28 LAB — CBC WITH DIFFERENTIAL/PLATELET
BASOS ABS: 0 10*3/uL (ref 0–0.1)
Basophils Relative: 1 %
EOS ABS: 0.1 10*3/uL (ref 0–0.7)
EOS PCT: 1 %
HCT: 33.1 % — ABNORMAL LOW (ref 40.0–52.0)
Hemoglobin: 11.1 g/dL — ABNORMAL LOW (ref 13.0–18.0)
LYMPHS ABS: 0.2 10*3/uL — AB (ref 1.0–3.6)
LYMPHS PCT: 3 %
MCH: 32.5 pg (ref 26.0–34.0)
MCHC: 33.4 g/dL (ref 32.0–36.0)
MCV: 97.3 fL (ref 80.0–100.0)
Monocytes Absolute: 0.2 10*3/uL (ref 0.2–1.0)
Monocytes Relative: 4 %
Neutro Abs: 4.5 10*3/uL (ref 1.4–6.5)
Neutrophils Relative %: 91 %
PLATELETS: 222 10*3/uL (ref 150–440)
RBC: 3.41 MIL/uL — AB (ref 4.40–5.90)
RDW: 17 % — ABNORMAL HIGH (ref 11.5–14.5)
WBC: 4.9 10*3/uL (ref 3.8–10.6)

## 2017-10-28 LAB — BASIC METABOLIC PANEL
ANION GAP: 8 (ref 5–15)
BUN: 34 mg/dL — ABNORMAL HIGH (ref 6–20)
CALCIUM: 9.6 mg/dL (ref 8.9–10.3)
CO2: 23 mmol/L (ref 22–32)
Chloride: 105 mmol/L (ref 101–111)
Creatinine, Ser: 2.3 mg/dL — ABNORMAL HIGH (ref 0.61–1.24)
GFR, EST AFRICAN AMERICAN: 34 mL/min — AB (ref >60)
GFR, EST NON AFRICAN AMERICAN: 29 mL/min — AB (ref >60)
GLUCOSE: 124 mg/dL — AB (ref 65–99)
POTASSIUM: 4.9 mmol/L (ref 3.5–5.1)
Sodium: 136 mmol/L (ref 135–145)

## 2017-10-28 LAB — PHOSPHORUS: Phosphorus: 3.1 mg/dL (ref 2.5–4.6)

## 2017-10-28 LAB — MAGNESIUM: MAGNESIUM: 1.5 mg/dL — AB (ref 1.7–2.4)

## 2017-10-28 MED ORDER — PROGRAF 1 MG CAPSULE: 4 mg | capsule | Freq: Two times a day (BID) | 11 refills | 0 days | Status: AC

## 2017-10-28 MED ORDER — PROGRAF 1 MG CAPSULE: capsule | 11 refills | 0 days

## 2017-10-28 MED ORDER — PROGRAF 1 MG CAPSULE
ORAL_CAPSULE | Freq: Two times a day (BID) | ORAL | 11 refills | 0.00000 days | Status: CP
Start: 2017-10-28 — End: 2017-12-27

## 2017-10-28 NOTE — Unmapped (Signed)
[facility] Pharm[facility] Refill Coordination Note  Specialty Medication(s): Prograf 1mg     [facility], [place]: 1955-12-24  Phone: [phone] (home) , Alternate phone contact: N/A  Phone or address changes today?: No  All above HIPAA information was verified with patient.  Shipping Address: [address]  [place] [address]   Insurance changes? No    Completed refill call assessment today to schedule patient's medication shipment from the [facility] [phone]).      Confirmed the medication and dosage are correct and have not changed: Yes, regimen is correct and unchanged.    Confirmed patient started or stopped the following medications in the past month:  No, there are no changes reported at this time.    Are you tolerating your medication?:  Esaw reports tolerating the medication.    ADHERENCE    (Below is required for Medicare Part B or Transplant patients only - per drug):   How many tablets were dispensed last month:     Prograf 1 mg   Quantity filled last month: 180   # of tablets left on hand: 6 DAYS    Did you miss any doses in the past 4 weeks? No missed doses reported.    FINANCIAL/SHIPPING    Delivery Scheduled: Yes, Expected medication delivery date: 11/01/2017     The patient will receive an FSI print out for each medication shipped and additional FDA Medication Guides as required.  Patient education from [place] or [name] may also be included in the shipment    Margaret did not have any additional questions at this time.    Delivery address validated in FSI scheduling system: Yes, address listed in FSI is correct.    We will follow up with patient monthly for standard refill processing and delivery.      Thank you,  Tamala Fothergill   [facility] Specialty Technician

## 2017-10-29 LAB — TACROLIMUS LEVEL: TACROLIMUS (FK506) - LABCORP: 12.7 ng/mL (ref 2.0–20.0)

## 2017-10-29 LAB — CBC W/ DIFFERENTIAL
BASOPHILS ABSOLUTE COUNT: 0 10*9/L
BASOPHILS RELATIVE PERCENT: 0 %
EOSINOPHILS ABSOLUTE COUNT: 0 10*9/L
HEMOGLOBIN: 10.6 g/dL — ABNORMAL LOW
LYMPHOCYTES ABSOLUTE COUNT: 0.1 10*9/L — ABNORMAL LOW
LYMPHOCYTES RELATIVE PERCENT: 2 %
MEAN CORPUSCULAR HEMOGLOBIN CONC: 33.5 g/dL
MEAN CORPUSCULAR HEMOGLOBIN: 33 pg
MEAN CORPUSCULAR VOLUME: 98.4 fL
MONOCYTES ABSOLUTE COUNT: 0.1 10*9/L — ABNORMAL LOW
MONOCYTES RELATIVE PERCENT: 4 %
NEUTROPHILS ABSOLUTE COUNT: 3.7 10*9/L
NEUTROPHILS RELATIVE PERCENT: 93 %
PLATELET COUNT: 238 10*9/L
RED BLOOD CELL COUNT: 3.21 10*12/L — ABNORMAL LOW
RED CELL DISTRIBUTION WIDTH: 17.3 % — ABNORMAL HIGH
WBC ADJUSTED: 4 10*9/L

## 2017-10-29 LAB — PHOSPHORUS: Lab: 3.2

## 2017-10-29 LAB — MICROCYTES: Lab: 0

## 2017-10-29 LAB — BASIC METABOLIC PANEL
BLOOD UREA NITROGEN: 33 mg/dL — ABNORMAL HIGH
CHLORIDE: 107 mmol/L
CREATININE: 2.4 mg/dL — ABNORMAL HIGH
EGFR MDRD NON AF AMER: 28 mL/min/{1.73_m2} — ABNORMAL LOW
POTASSIUM: 5.5 mmol/L — ABNORMAL HIGH
SODIUM: 139 mmol/L

## 2017-10-29 LAB — SODIUM: Lab: 139

## 2017-10-29 LAB — MAGNESIUM: Lab: 1.7

## 2017-10-30 ENCOUNTER — Other Ambulatory Visit
Admission: RE | Admit: 2017-10-30 | Discharge: 2017-10-30 | Disposition: A | Discharge status: Home / Self Care | Payer: Medicare Other | Source: Ambulatory Visit | Attending: Nephrology | Admitting: Nephrology

## 2017-10-30 DIAGNOSIS — Z9483 Pancreas transplant status: Secondary | ICD-10-CM | POA: Diagnosis not present

## 2017-10-30 DIAGNOSIS — N390 Urinary tract infection, site not specified: Secondary | ICD-10-CM | POA: Insufficient documentation

## 2017-10-30 DIAGNOSIS — D631 Anemia in chronic kidney disease: Secondary | ICD-10-CM | POA: Diagnosis not present

## 2017-10-30 DIAGNOSIS — Z940 Kidney transplant status: Secondary | ICD-10-CM | POA: Insufficient documentation

## 2017-10-30 DIAGNOSIS — B259 Cytomegaloviral disease, unspecified: Secondary | ICD-10-CM | POA: Insufficient documentation

## 2017-10-30 DIAGNOSIS — E1122 Type 2 diabetes mellitus with diabetic chronic kidney disease: Secondary | ICD-10-CM | POA: Diagnosis not present

## 2017-10-30 DIAGNOSIS — N189 Chronic kidney disease, unspecified: Secondary | ICD-10-CM | POA: Insufficient documentation

## 2017-10-30 DIAGNOSIS — Z79899 Other long term (current) drug therapy: Secondary | ICD-10-CM | POA: Insufficient documentation

## 2017-10-30 DIAGNOSIS — E559 Vitamin D deficiency, unspecified: Secondary | ICD-10-CM | POA: Insufficient documentation

## 2017-10-30 DIAGNOSIS — D899 Disorder involving the immune mechanism, unspecified: Secondary | ICD-10-CM | POA: Diagnosis present

## 2017-10-30 DIAGNOSIS — Z789 Other specified health status: Secondary | ICD-10-CM | POA: Diagnosis not present

## 2017-10-30 DIAGNOSIS — Z114 Encounter for screening for human immunodeficiency virus [HIV]: Secondary | ICD-10-CM | POA: Insufficient documentation

## 2017-10-30 DIAGNOSIS — E1129 Type 2 diabetes mellitus with other diabetic kidney complication: Secondary | ICD-10-CM | POA: Insufficient documentation

## 2017-10-30 LAB — BASIC METABOLIC PANEL
Anion gap: 5 (ref 5–15)
BUN: 35 mg/dL — AB (ref 6–20)
CALCIUM: 9.4 mg/dL (ref 8.9–10.3)
CO2: 24 mmol/L (ref 22–32)
Chloride: 107 mmol/L (ref 101–111)
Creatinine, Ser: 2.49 mg/dL — ABNORMAL HIGH (ref 0.61–1.24)
GFR calc Af Amer: 31 mL/min — ABNORMAL LOW (ref >60)
GFR, EST NON AFRICAN AMERICAN: 27 mL/min — AB (ref >60)
GLUCOSE: 128 mg/dL — AB (ref 65–99)
Potassium: 5.1 mmol/L (ref 3.5–5.1)
SODIUM: 136 mmol/L (ref 135–145)

## 2017-10-30 LAB — CBC WITH DIFFERENTIAL/PLATELET
BASOS PCT: 1 %
Basophils Absolute: 0 10*3/uL (ref 0–0.1)
EOS ABS: 0.1 10*3/uL (ref 0–0.7)
Eosinophils Relative: 1 %
HCT: 31.8 % — ABNORMAL LOW (ref 40.0–52.0)
HEMOGLOBIN: 10.6 g/dL — AB (ref 13.0–18.0)
LYMPHS ABS: 0.1 10*3/uL — AB (ref 1.0–3.6)
Lymphocytes Relative: 2 %
MCH: 32.8 pg (ref 26.0–34.0)
MCHC: 33.4 g/dL (ref 32.0–36.0)
MCV: 98.2 fL (ref 80.0–100.0)
MONOS PCT: 4 %
Monocytes Absolute: 0.2 10*3/uL (ref 0.2–1.0)
Neutro Abs: 4.7 10*3/uL (ref 1.4–6.5)
Neutrophils Relative %: 92 %
Platelets: 220 10*3/uL (ref 150–440)
RBC: 3.24 MIL/uL — ABNORMAL LOW (ref 4.40–5.90)
RDW: 16.8 % — AB (ref 11.5–14.5)
WBC: 5.1 10*3/uL (ref 3.8–10.6)

## 2017-10-30 LAB — MAGNESIUM: Magnesium: 1.6 mg/dL — ABNORMAL LOW (ref 1.7–2.4)

## 2017-10-30 LAB — PHOSPHORUS: PHOSPHORUS: 3.3 mg/dL (ref 2.5–4.6)

## 2017-10-31 LAB — TACROLIMUS LEVEL: TACROLIMUS (FK506) - LABCORP: 7.6 ng/mL (ref 2.0–20.0)

## 2017-10-31 LAB — COMPREHENSIVE METABOLIC PANEL
BLOOD UREA NITROGEN: 35 mg/dL — ABNORMAL HIGH
CALCIUM: 9.4 mg/dL
CHLORIDE: 107 mmol/L
CO2: 24 mmol/L
CREATININE: 2.49 mg/dL — ABNORMAL HIGH
EGFR MDRD AF AMER: 31 mL/min/{1.73_m2} — ABNORMAL LOW
EGFR MDRD NON AF AMER: 27 mL/min/{1.73_m2} — ABNORMAL LOW
GLUCOSE RANDOM: 128 mg/dL — ABNORMAL HIGH
POTASSIUM: 5.1 mmol/L
SODIUM: 136 mmol/L

## 2017-10-31 LAB — CBC W/ DIFFERENTIAL
BASOPHILS ABSOLUTE COUNT: 0 10*9/L
BASOPHILS RELATIVE PERCENT: 1 %
EOSINOPHILS ABSOLUTE COUNT: 0.1 10*9/L
EOSINOPHILS RELATIVE PERCENT: 1 %
HEMATOCRIT: 31.8 % — ABNORMAL LOW
HEMOGLOBIN: 10.6 g/dL — ABNORMAL LOW
LYMPHOCYTES ABSOLUTE COUNT: 0.1 10*9/L — ABNORMAL LOW
LYMPHOCYTES RELATIVE PERCENT: 2 %
MEAN CORPUSCULAR HEMOGLOBIN CONC: 33.4 g/dL
MEAN CORPUSCULAR HEMOGLOBIN: 32.8 pg
MONOCYTES ABSOLUTE COUNT: 0.2 10*9/L
MONOCYTES RELATIVE PERCENT: 4 %
NEUTROPHILS ABSOLUTE COUNT: 4.7 10*9/L
NEUTROPHILS RELATIVE PERCENT: 92 %
PLATELET COUNT: 220 10*9/L
RED BLOOD CELL COUNT: 3.24 10*12/L — ABNORMAL LOW
RED CELL DISTRIBUTION WIDTH: 16.8 % — ABNORMAL HIGH
WBC ADJUSTED: 5.1 10*9/L

## 2017-10-31 LAB — SODIUM: Lab: 136

## 2017-10-31 LAB — PHOSPHORUS
Lab: 3.3
PHOSPHORUS: 3.3 mg/dL

## 2017-10-31 LAB — MAGNESIUM
Lab: 1.6 — ABNORMAL LOW
MAGNESIUM: 1.6 mg/dL — ABNORMAL LOW

## 2017-10-31 LAB — HYPOCHROMIA: Lab: 0

## 2017-10-31 MED FILL — PROGRAF/1MG/CAP: PROGRAF/1MG/CAP | 30 days supply | Qty: 240 | Fill #0

## 2017-11-01 ENCOUNTER — Other Ambulatory Visit
Admission: RE | Admit: 2017-11-01 | Discharge: 2017-11-01 | Disposition: A | Discharge status: Home / Self Care | Payer: Medicare Other | Source: Ambulatory Visit | Attending: Nephrology | Admitting: Nephrology

## 2017-11-01 DIAGNOSIS — R7881 Bacteremia: Secondary | ICD-10-CM | POA: Diagnosis present

## 2017-11-01 DIAGNOSIS — N183 Chronic kidney disease, stage 3 (moderate): Secondary | ICD-10-CM | POA: Diagnosis present

## 2017-11-01 DIAGNOSIS — T827XXA Infection and inflammatory reaction due to other cardiac and vascular devices, implants and grafts, initial encounter: Secondary | ICD-10-CM | POA: Insufficient documentation

## 2017-11-01 DIAGNOSIS — X58XXXA Exposure to other specified factors, initial encounter: Secondary | ICD-10-CM | POA: Diagnosis not present

## 2017-11-01 DIAGNOSIS — E118 Type 2 diabetes mellitus with unspecified complications: Secondary | ICD-10-CM | POA: Insufficient documentation

## 2017-11-01 LAB — BASIC METABOLIC PANEL
Anion gap: 5 (ref 5–15)
BUN: 38 mg/dL — AB (ref 6–20)
CHLORIDE: 108 mmol/L (ref 101–111)
CO2: 24 mmol/L (ref 22–32)
CREATININE: 2.38 mg/dL — AB (ref 0.61–1.24)
Calcium: 9.5 mg/dL (ref 8.9–10.3)
GFR calc non Af Amer: 28 mL/min — ABNORMAL LOW (ref >60)
GFR, EST AFRICAN AMERICAN: 32 mL/min — AB (ref >60)
GLUCOSE: 127 mg/dL — AB (ref 65–99)
Potassium: 5.4 mmol/L — ABNORMAL HIGH (ref 3.5–5.1)
Sodium: 137 mmol/L (ref 135–145)

## 2017-11-01 LAB — CBC WITH DIFFERENTIAL/PLATELET
BASOS ABS: 0 10*3/uL (ref 0–0.1)
Basophils Relative: 0 %
Eosinophils Absolute: 0 10*3/uL (ref 0–0.7)
Eosinophils Relative: 1 %
HCT: 33 % — ABNORMAL LOW (ref 40.0–52.0)
Hemoglobin: 11 g/dL — ABNORMAL LOW (ref 13.0–18.0)
LYMPHS ABS: 0.1 10*3/uL — AB (ref 1.0–3.6)
LYMPHS PCT: 2 %
MCH: 32.5 pg (ref 26.0–34.0)
MCHC: 33.1 g/dL (ref 32.0–36.0)
MCV: 98.2 fL (ref 80.0–100.0)
MONO ABS: 0.2 10*3/uL (ref 0.2–1.0)
Monocytes Relative: 3 %
NEUTROS ABS: 4.9 10*3/uL (ref 1.4–6.5)
Neutrophils Relative %: 94 %
Platelets: 210 10*3/uL (ref 150–440)
RBC: 3.37 MIL/uL — ABNORMAL LOW (ref 4.40–5.90)
RDW: 16.4 % — AB (ref 11.5–14.5)
WBC: 5.2 10*3/uL (ref 3.8–10.6)

## 2017-11-01 LAB — MAGNESIUM: MAGNESIUM: 1.7 mg/dL (ref 1.7–2.4)

## 2017-11-01 LAB — PHOSPHORUS: Phosphorus: 3.2 mg/dL (ref 2.5–4.6)

## 2017-11-01 MED FILL — SULFAMETHOXAZOLE/TRIMETHO/400-80MG/TABS: SULFAMETHOXAZOLE/TRIMETHO/400-80MG/TABS | 30 days supply | Qty: 12 | Fill #0

## 2017-11-03 LAB — TACROLIMUS LEVEL: Tacrolimus (FK506) - LabCorp: 8.1 ng/mL (ref 2.0–20.0)

## 2017-11-04 ENCOUNTER — Other Ambulatory Visit
Admission: RE | Admit: 2017-11-04 | Discharge: 2017-11-04 | Disposition: A | Discharge status: Home / Self Care | Payer: Medicare Other | Source: Ambulatory Visit | Attending: Nephrology | Admitting: Nephrology

## 2017-11-04 DIAGNOSIS — N390 Urinary tract infection, site not specified: Secondary | ICD-10-CM | POA: Insufficient documentation

## 2017-11-04 DIAGNOSIS — B269 Mumps without complication: Secondary | ICD-10-CM | POA: Insufficient documentation

## 2017-11-04 DIAGNOSIS — Z789 Other specified health status: Secondary | ICD-10-CM | POA: Diagnosis not present

## 2017-11-04 DIAGNOSIS — Z09 Encounter for follow-up examination after completed treatment for conditions other than malignant neoplasm: Secondary | ICD-10-CM | POA: Insufficient documentation

## 2017-11-04 DIAGNOSIS — D631 Anemia in chronic kidney disease: Secondary | ICD-10-CM | POA: Insufficient documentation

## 2017-11-04 DIAGNOSIS — Z940 Kidney transplant status: Secondary | ICD-10-CM | POA: Insufficient documentation

## 2017-11-04 DIAGNOSIS — E559 Vitamin D deficiency, unspecified: Secondary | ICD-10-CM | POA: Diagnosis not present

## 2017-11-04 DIAGNOSIS — Z114 Encounter for screening for human immunodeficiency virus [HIV]: Secondary | ICD-10-CM | POA: Diagnosis not present

## 2017-11-04 DIAGNOSIS — Z9483 Pancreas transplant status: Secondary | ICD-10-CM | POA: Insufficient documentation

## 2017-11-04 DIAGNOSIS — Z79899 Other long term (current) drug therapy: Secondary | ICD-10-CM | POA: Diagnosis present

## 2017-11-04 DIAGNOSIS — E669 Obesity, unspecified: Secondary | ICD-10-CM | POA: Insufficient documentation

## 2017-11-04 DIAGNOSIS — D899 Disorder involving the immune mechanism, unspecified: Secondary | ICD-10-CM | POA: Insufficient documentation

## 2017-11-04 LAB — CBC WITH DIFFERENTIAL/PLATELET
BASOS ABS: 0 10*3/uL (ref 0–0.1)
Basophils Relative: 1 %
EOS ABS: 0.1 10*3/uL (ref 0–0.7)
Eosinophils Relative: 2 %
HCT: 34 % — ABNORMAL LOW (ref 40.0–52.0)
HEMOGLOBIN: 11.4 g/dL — AB (ref 13.0–18.0)
Lymphocytes Relative: 3 %
Lymphs Abs: 0.2 10*3/uL — ABNORMAL LOW (ref 1.0–3.6)
MCH: 32.7 pg (ref 26.0–34.0)
MCHC: 33.4 g/dL (ref 32.0–36.0)
MCV: 97.9 fL (ref 80.0–100.0)
Monocytes Absolute: 0.2 10*3/uL (ref 0.2–1.0)
Monocytes Relative: 5 %
NEUTROS PCT: 89 %
Neutro Abs: 4.8 10*3/uL (ref 1.4–6.5)
Platelets: 208 10*3/uL (ref 150–440)
RBC: 3.48 MIL/uL — AB (ref 4.40–5.90)
RDW: 16.6 % — ABNORMAL HIGH (ref 11.5–14.5)
WBC: 5.3 10*3/uL (ref 3.8–10.6)

## 2017-11-04 LAB — BASIC METABOLIC PANEL
ANION GAP: 9 (ref 5–15)
BUN: 31 mg/dL — ABNORMAL HIGH (ref 6–20)
CO2: 23 mmol/L (ref 22–32)
Calcium: 9.6 mg/dL (ref 8.9–10.3)
Chloride: 103 mmol/L (ref 101–111)
Creatinine, Ser: 2.29 mg/dL — ABNORMAL HIGH (ref 0.61–1.24)
GFR, EST AFRICAN AMERICAN: 34 mL/min — AB (ref >60)
GFR, EST NON AFRICAN AMERICAN: 29 mL/min — AB (ref >60)
GLUCOSE: 130 mg/dL — AB (ref 65–99)
POTASSIUM: 5 mmol/L (ref 3.5–5.1)
SODIUM: 135 mmol/L (ref 135–145)

## 2017-11-04 LAB — MAGNESIUM
Lab: 1.7
MAGNESIUM: 1.8 mg/dL (ref 1.7–2.4)

## 2017-11-04 LAB — PHOSPHORUS
Lab: 3.2
PHOSPHORUS: 2.9 mg/dL (ref 2.5–4.6)

## 2017-11-04 LAB — COMPREHENSIVE METABOLIC PANEL
CHLORIDE: 108 mmol/L
CO2: 24 mmol/L
CREATININE: 2.38 mg/dL — ABNORMAL HIGH
EGFR MDRD AF AMER: 32 mL/min/{1.73_m2} — ABNORMAL LOW
EGFR MDRD NON AF AMER: 28 mL/min/{1.73_m2} — ABNORMAL LOW
GLUCOSE RANDOM: 127 mg/dL — ABNORMAL HIGH
POTASSIUM: 5.4 mmol/L — ABNORMAL HIGH
SODIUM: 137 mmol/L

## 2017-11-04 LAB — CBC W/ DIFFERENTIAL
BASOPHILS ABSOLUTE COUNT: 0 10*9/L
BASOPHILS RELATIVE PERCENT: 0 %
EOSINOPHILS ABSOLUTE COUNT: 0 10*9/L
EOSINOPHILS RELATIVE PERCENT: 1 %
HEMATOCRIT: 33 % — ABNORMAL LOW
HEMOGLOBIN: 11 g/dL — ABNORMAL LOW
LYMPHOCYTES ABSOLUTE COUNT: 0.1 10*9/L — ABNORMAL LOW
LYMPHOCYTES RELATIVE PERCENT: 2 %
MEAN CORPUSCULAR HEMOGLOBIN: 32.5 pg
MEAN CORPUSCULAR VOLUME: 98.2 fL
MONOCYTES ABSOLUTE COUNT: 3 10*9/L
MONOCYTES RELATIVE PERCENT: 3 %
NEUTROPHILS ABSOLUTE COUNT: 4.9 10*9/L
NEUTROPHILS RELATIVE PERCENT: 94 %
PLATELET COUNT: 210 10*9/L
RED BLOOD CELL COUNT: 3.37 10*12/L — ABNORMAL LOW
RED CELL DISTRIBUTION WIDTH: 16.4 %
WBC ADJUSTED: 5.2 10*9/L

## 2017-11-04 LAB — GLUCOSE RANDOM: Lab: 127 — ABNORMAL HIGH

## 2017-11-04 LAB — NEUTROPHILS RELATIVE PERCENT: Lab: 94

## 2017-11-04 NOTE — Unmapped (Signed)
[facility] Refill Coordination Note    Specialty Medication(s) to be Shipped:   Transplant: Myfortic 180mg     Other medication(s) to be shipped:      [facility], DOB: April 13, 1956  Phone: [phone] (home)   Shipping Address: [address]  [place] [address]    All above HIPAA information was verified with patient.     Completed refill call assessment today to schedule patient's medication shipment from the [facility] [phone]).       Specialty medication(s) and dose(s) confirmed: Regimen is correct and unchanged.   Changes to medications: Justin Mcknight reports no changes reported at this time.  Changes to insurance: No  Questions for the pharmacist: No    The patient will receive an FSI print out for each medication shipped and additional FDA Medication Guides as required.  Patient education from [place] or [name] may also be included in the shipment.    DISEASE-SPECIFIC INFORMATION        N/A    ADHERENCE     Medication Adherence    Patient reported X missed doses in the last month:  0  Specialty Medication:  MYFORTIC 180MG    Patient is on additional specialty medications:  No  Informant:  patient  Support network for adherence:  family member  Confirmed plan for next specialty medication refill:  delivery by pharmacy  Refills needed for supportive medications:  not needed          Refill Coordination    Has the Patients' Contact Information Changed:  No  Is the Shipping Address Different:  No         MEDICARE PART B DOCUMENTATION     Myfortic 180mg : Patient has 24 tablets on hand.     Myfortic 180 mg   Quantity filled last month: 180   # of tablets left on hand: 24        SHIPPING     Shipping address confirmed in FSI.     Delivery Scheduled: Yes, Expected medication delivery date: 11/06/17 via UPS or courier.     Jolene Schimke   [facility]

## 2017-11-05 ENCOUNTER — Ambulatory Visit: Admit: 2017-11-05 | Discharge: 2017-11-06 | Payer: MEDICARE | Attending: Nephrology | Primary: Nephrology

## 2017-11-05 ENCOUNTER — Ambulatory Visit
Admit: 2017-11-05 | Discharge: 2017-11-06 | Payer: MEDICARE | Attending: Pharmacist Clinician (PhC)/ Clinical Pharmacy Specialist | Primary: Pharmacist Clinician (PhC)/ Clinical Pharmacy Specialist

## 2017-11-05 ENCOUNTER — Ambulatory Visit: Admit: 2017-11-05 | Discharge: 2017-11-06 | Payer: MEDICARE

## 2017-11-05 DIAGNOSIS — D899 Disorder involving the immune mechanism, unspecified: Secondary | ICD-10-CM

## 2017-11-05 DIAGNOSIS — Z940 Kidney transplant status: Principal | ICD-10-CM

## 2017-11-05 LAB — TACROLIMUS LEVEL: TACROLIMUS (FK506) - LABCORP: 7.6 ng/mL (ref 2.0–20.0)

## 2017-11-05 LAB — BACTERIA: Lab: NONE SEEN

## 2017-11-05 LAB — PROTEIN / CREATININE RATIO, URINE: CREATININE, URINE: 62 mg/dL

## 2017-11-05 LAB — URINALYSIS
BACTERIA: NONE SEEN /HPF
BLOOD UA: NEGATIVE
KETONES UA: NEGATIVE
LEUKOCYTE ESTERASE UA: NEGATIVE
NITRITE UA: NEGATIVE
PH UA: 5.5 (ref 5.0–9.0)
RBC UA: <1 /HPF (ref <=3)
SPECIFIC GRAVITY UA: 1.01 (ref 1.003–1.030)
SQUAMOUS EPITHELIAL: <1 /HPF (ref 0–5)
UROBILINOGEN UA: 0.2
WBC UA: <1 /HPF (ref <=2)

## 2017-11-05 LAB — PROTEIN/CREAT RATIO, URINE: Protein/Creatinine:MRto:Pt:Urine:Qn:: 0.553

## 2017-11-05 MED ORDER — AMLODIPINE 10 MG TABLET
ORAL_TABLET | Freq: Every evening | ORAL | 4 refills | 0.00000 days
Start: 2017-11-05 — End: 2018-02-25

## 2017-11-05 MED FILL — MYFORTIC/180MG/TAB: MYFORTIC/180MG/TAB | 30 days supply | Qty: 180 | Fill #1

## 2017-11-05 NOTE — Unmapped (Signed)
[employer] transplant nephrology clinic visit    assessment and plan  1. s/p kidney transplant 09/03/2017. baseline creatinine 2.5-3 mg/dl. +mild proteinuria. no donor specific hla ab detected. no changes in maintenance immunosuppression; potential transition to belatacept if kidney function does not cont to improve. repeat kidney biopsy to be scheduled. tacrolimus lvl 6-8 ng/dl.   2. hypertension. amlodipine decr 5 mg q.pm. blood pressure goal <130/80 mmhg.  3. preventive medicine. influenza '18. ppsv23 pneumococcal '12. valganciclovir 450mg  3x/wk 2-5/19. trimethoprim/sulfamethoxazole 80/400mg  3x/wk x75m 2-8/19.     history of present illness    Justin Mcknight is a 62 year old gentleman seen in follow up s/p kidney transplant 09/03/2017. post transplant course notable for +mild focal thrombotic microangiopathy seen on zero hour kidney bx and on follow up x2wks post transplant. +slow improving creatinine lvl.     [name] is seen with assistance of [place] Engineer, structural. he feels well. no fevers chills or sweats. +mild upper respiratory tract infection symptoms last week/now resolved. +occ transient low blood pressure readings with lightheadedness. no chest pain palpitations or shortness of breath. no lower ext edema. appetite nl. no n/v/d. no dysuria hematuria or difficulty voiding. no tremor weakness or paresthesias. all other systems reviewed and negative x10 systems.    past medical hx:  1. 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 hypertension. alemtuzumab induction. baseline creatinine 2.5-3 mg/dl.  > kidney bx 03/19: no acute rejection. +mild focal thrombotic microangiopathy/seen on zero hour bx.  2. hypertension  3. hx renal cell carcinoma s/p right nephrectomy '12.    past surgical hx: left forearm av fistula '12. right nephrectomy '12. kidney transplant '19.    allergies: codeine    medications: tacrolimus 4mg  bid, mycophenolate sodium 540mg  bid, amlodipine 10mg  daily, valganciclovir 450mg  3x/wk, trimethoprim/sulfamethoxazole 80/400mg  3x/wk.    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    physical exam: t96.7 p88 bp130/78 wt79kg bmi 22.4. wd/wn gentleman nl/appropriate affect and mood. nl sclera anicteric. mmm no thrush. neck supple no palpable ln. heart rrr nl s1s2 no m/r/g. lungs clear bilateral. abd soft nt/nd. no lower ext edema. msk no synovitis/tophi. skin no rash. neuro alert oriented non focal exam.    labs 11/04/17: wbc5.3 hgb11.4 hct34 plts208. na135 k5.0 cl103 bicarb23 bun31 cr2.3 glc130 ca9.6 mg1.8 phos2.9. tacrolimus lvl 7.6 ng/ml. urine protein/cr 0.553.    scribe's attestation: Addalyn Speedy, md obtained and performed the history, physical exam and medical decision making elements that were entered into the chart. signed by [place] ormond foster, scribe, on November 05, 2017 10:23 am.    ----------------------------------------------------------------------------------------------------------------------  Nov 25, 2017 8:02 AM. documentation assistance provided by the scribe. i was present during the time the encounter was recorded. the information recorded by the scribe was done at my direction and has been reviewed and validated by me.  ----------------------------------------------------------------------------------------------------------------------

## 2017-11-05 NOTE — Unmapped (Signed)
Patient left clinic before pharmacist could see patient.  Can see patient at next visit.

## 2017-11-06 ENCOUNTER — Other Ambulatory Visit
Admission: RE | Admit: 2017-11-06 | Discharge: 2017-11-06 | Disposition: A | Discharge status: Home / Self Care | Payer: Medicare Other | Source: Ambulatory Visit | Attending: Nephrology | Admitting: Nephrology

## 2017-11-06 DIAGNOSIS — Z789 Other specified health status: Secondary | ICD-10-CM | POA: Insufficient documentation

## 2017-11-06 DIAGNOSIS — E1129 Type 2 diabetes mellitus with other diabetic kidney complication: Secondary | ICD-10-CM | POA: Diagnosis not present

## 2017-11-06 DIAGNOSIS — N390 Urinary tract infection, site not specified: Secondary | ICD-10-CM | POA: Insufficient documentation

## 2017-11-06 DIAGNOSIS — B259 Cytomegaloviral disease, unspecified: Secondary | ICD-10-CM | POA: Diagnosis not present

## 2017-11-06 DIAGNOSIS — Z79899 Other long term (current) drug therapy: Secondary | ICD-10-CM | POA: Diagnosis not present

## 2017-11-06 DIAGNOSIS — D899 Disorder involving the immune mechanism, unspecified: Secondary | ICD-10-CM | POA: Insufficient documentation

## 2017-11-06 DIAGNOSIS — T8610 Unspecified complication of kidney transplant: Secondary | ICD-10-CM | POA: Diagnosis not present

## 2017-11-06 DIAGNOSIS — Z9483 Pancreas transplant status: Secondary | ICD-10-CM | POA: Insufficient documentation

## 2017-11-06 DIAGNOSIS — Z09 Encounter for follow-up examination after completed treatment for conditions other than malignant neoplasm: Secondary | ICD-10-CM | POA: Insufficient documentation

## 2017-11-06 DIAGNOSIS — Z940 Kidney transplant status: Secondary | ICD-10-CM | POA: Insufficient documentation

## 2017-11-06 DIAGNOSIS — D631 Anemia in chronic kidney disease: Secondary | ICD-10-CM | POA: Diagnosis not present

## 2017-11-06 LAB — CBC WITH DIFFERENTIAL/PLATELET
BASOS PCT: 1 %
Basophils Absolute: 0 10*3/uL (ref 0–0.1)
EOS ABS: 0 10*3/uL (ref 0–0.7)
EOS PCT: 1 %
HCT: 34.7 % — ABNORMAL LOW (ref 40.0–52.0)
HEMOGLOBIN: 11.7 g/dL — AB (ref 13.0–18.0)
LYMPHS ABS: 0.1 10*3/uL — AB (ref 1.0–3.6)
Lymphocytes Relative: 3 %
MCH: 33.1 pg (ref 26.0–34.0)
MCHC: 33.8 g/dL (ref 32.0–36.0)
MCV: 97.8 fL (ref 80.0–100.0)
MONO ABS: 0.1 10*3/uL — AB (ref 0.2–1.0)
MONOS PCT: 2 %
Neutro Abs: 4.1 10*3/uL (ref 1.4–6.5)
Neutrophils Relative %: 93 %
PLATELETS: 231 10*3/uL (ref 150–440)
RBC: 3.55 MIL/uL — ABNORMAL LOW (ref 4.40–5.90)
RDW: 16.3 % — AB (ref 11.5–14.5)
WBC: 4.4 10*3/uL (ref 3.8–10.6)

## 2017-11-06 LAB — BASIC METABOLIC PANEL
Anion gap: 7 (ref 5–15)
BUN: 29 mg/dL — AB (ref 6–20)
CALCIUM: 9.5 mg/dL (ref 8.9–10.3)
CO2: 24 mmol/L (ref 22–32)
Chloride: 105 mmol/L (ref 101–111)
Creatinine, Ser: 2.23 mg/dL — ABNORMAL HIGH (ref 0.61–1.24)
GFR calc Af Amer: 35 mL/min — ABNORMAL LOW (ref >60)
GFR, EST NON AFRICAN AMERICAN: 30 mL/min — AB (ref >60)
GLUCOSE: 127 mg/dL — AB (ref 65–99)
Potassium: 5 mmol/L (ref 3.5–5.1)
SODIUM: 136 mmol/L (ref 135–145)

## 2017-11-06 LAB — MAGNESIUM
Lab: 1.8
Magnesium: 1.7 mg/dL (ref 1.7–2.4)

## 2017-11-06 LAB — PHOSPHORUS
Lab: 2.9
PHOSPHORUS: 2.7 mg/dL (ref 2.5–4.6)

## 2017-11-06 LAB — ANISOCYTOSIS: Lab: 0

## 2017-11-06 LAB — CBC W/ DIFFERENTIAL
BASOPHILS ABSOLUTE COUNT: 0 10*9/L
BASOPHILS RELATIVE PERCENT: 1 %
EOSINOPHILS ABSOLUTE COUNT: 0.1 10*9/L
EOSINOPHILS RELATIVE PERCENT: 2 %
HEMOGLOBIN: 11.4 g/dL — ABNORMAL LOW
LYMPHOCYTES ABSOLUTE COUNT: 0.2 10*9/L — ABNORMAL LOW
LYMPHOCYTES RELATIVE PERCENT: 3 %
MEAN CORPUSCULAR HEMOGLOBIN CONC: 33.4 g/dL
MEAN CORPUSCULAR HEMOGLOBIN: 32.7 pg
MEAN CORPUSCULAR VOLUME: 97.9 fL
MONOCYTES ABSOLUTE COUNT: 0.2 10*9/L
MONOCYTES RELATIVE PERCENT: 5 %
NEUTROPHILS ABSOLUTE COUNT: 4.8 10*9/L
NEUTROPHILS RELATIVE PERCENT: 89 %
PLATELET COUNT: 208 10*9/L
RED BLOOD CELL COUNT: 3.48 10*12/L — ABNORMAL LOW
RED CELL DISTRIBUTION WIDTH: 16.6 % — ABNORMAL HIGH
WBC ADJUSTED: 5.3 10*9/L

## 2017-11-06 LAB — COMPREHENSIVE METABOLIC PANEL
BLOOD UREA NITROGEN: 31 mg/dL — ABNORMAL HIGH
CALCIUM: 9.6 mg/dL
CHLORIDE: 103 mmol/L
CREATININE: 2.29 mg/dL — ABNORMAL HIGH
EGFR MDRD AF AMER: 34 mL/min/{1.73_m2} — ABNORMAL LOW
POTASSIUM: 5 mmol/L
SODIUM: 135 mmol/L

## 2017-11-06 LAB — TACROLIMUS, TROUGH
Lab: 12.7
Lab: 6.4
Lab: 7.6
Lab: 7.6
Lab: 8.1
Lab: 8.9

## 2017-11-06 LAB — ALKALINE PHOSPHATASE: Lab: 0

## 2017-11-08 ENCOUNTER — Other Ambulatory Visit
Admission: RE | Admit: 2017-11-08 | Discharge: 2017-11-08 | Disposition: A | Discharge status: Home / Self Care | Payer: Medicare Other | Source: Ambulatory Visit | Attending: Nephrology | Admitting: Nephrology

## 2017-11-08 DIAGNOSIS — Z114 Encounter for screening for human immunodeficiency virus [HIV]: Secondary | ICD-10-CM | POA: Diagnosis not present

## 2017-11-08 DIAGNOSIS — Z9483 Pancreas transplant status: Secondary | ICD-10-CM | POA: Diagnosis not present

## 2017-11-08 DIAGNOSIS — Z09 Encounter for follow-up examination after completed treatment for conditions other than malignant neoplasm: Secondary | ICD-10-CM | POA: Diagnosis not present

## 2017-11-08 DIAGNOSIS — D631 Anemia in chronic kidney disease: Secondary | ICD-10-CM | POA: Diagnosis not present

## 2017-11-08 DIAGNOSIS — B259 Cytomegaloviral disease, unspecified: Secondary | ICD-10-CM | POA: Diagnosis not present

## 2017-11-08 DIAGNOSIS — Z940 Kidney transplant status: Secondary | ICD-10-CM | POA: Diagnosis not present

## 2017-11-08 DIAGNOSIS — E559 Vitamin D deficiency, unspecified: Secondary | ICD-10-CM | POA: Insufficient documentation

## 2017-11-08 DIAGNOSIS — E1122 Type 2 diabetes mellitus with diabetic chronic kidney disease: Secondary | ICD-10-CM | POA: Diagnosis not present

## 2017-11-08 DIAGNOSIS — Z79899 Other long term (current) drug therapy: Secondary | ICD-10-CM | POA: Diagnosis not present

## 2017-11-08 DIAGNOSIS — Z789 Other specified health status: Secondary | ICD-10-CM | POA: Diagnosis not present

## 2017-11-08 DIAGNOSIS — N189 Chronic kidney disease, unspecified: Secondary | ICD-10-CM | POA: Diagnosis not present

## 2017-11-08 DIAGNOSIS — D899 Disorder involving the immune mechanism, unspecified: Secondary | ICD-10-CM | POA: Insufficient documentation

## 2017-11-08 DIAGNOSIS — N390 Urinary tract infection, site not specified: Secondary | ICD-10-CM | POA: Diagnosis not present

## 2017-11-08 LAB — CBC WITH DIFFERENTIAL/PLATELET
Basophils Absolute: 0 10*3/uL (ref 0–0.1)
Basophils Relative: 0 %
EOS ABS: 0.1 10*3/uL (ref 0–0.7)
EOS PCT: 1 %
HCT: 34.5 % — ABNORMAL LOW (ref 40.0–52.0)
HEMOGLOBIN: 11.5 g/dL — AB (ref 13.0–18.0)
LYMPHS ABS: 0.2 10*3/uL — AB (ref 1.0–3.6)
LYMPHS PCT: 3 %
MCH: 32.5 pg (ref 26.0–34.0)
MCHC: 33.3 g/dL (ref 32.0–36.0)
MCV: 97.3 fL (ref 80.0–100.0)
MONOS PCT: 3 %
Monocytes Absolute: 0.2 10*3/uL (ref 0.2–1.0)
Neutro Abs: 5.1 10*3/uL (ref 1.4–6.5)
Neutrophils Relative %: 93 %
Platelets: 268 10*3/uL (ref 150–440)
RBC: 3.55 MIL/uL — ABNORMAL LOW (ref 4.40–5.90)
RDW: 15.9 % — ABNORMAL HIGH (ref 11.5–14.5)
WBC: 5.5 10*3/uL (ref 3.8–10.6)

## 2017-11-08 LAB — BASIC METABOLIC PANEL
Anion gap: 5 (ref 5–15)
BUN: 36 mg/dL — AB (ref 6–20)
CALCIUM: 9.4 mg/dL (ref 8.9–10.3)
CO2: 24 mmol/L (ref 22–32)
Chloride: 105 mmol/L (ref 101–111)
Creatinine, Ser: 2.54 mg/dL — ABNORMAL HIGH (ref 0.61–1.24)
GFR calc Af Amer: 30 mL/min — ABNORMAL LOW (ref >60)
GFR, EST NON AFRICAN AMERICAN: 26 mL/min — AB (ref >60)
GLUCOSE: 120 mg/dL — AB (ref 65–99)
Potassium: 5 mmol/L (ref 3.5–5.1)
Sodium: 134 mmol/L — ABNORMAL LOW (ref 135–145)

## 2017-11-08 LAB — PHOSPHORUS: Phosphorus: 3.1 mg/dL (ref 2.5–4.6)

## 2017-11-08 LAB — MAGNESIUM: Magnesium: 1.7 mg/dL (ref 1.7–2.4)

## 2017-11-08 LAB — TACROLIMUS LEVEL: TACROLIMUS (FK506) - LABCORP: 8.8 ng/mL (ref 2.0–20.0)

## 2017-11-10 LAB — TACROLIMUS LEVEL: TACROLIMUS (FK506) - LABCORP: 8.9 ng/mL (ref 2.0–20.0)

## 2017-11-11 ENCOUNTER — Ambulatory Visit: Admit: 2017-11-11 | Discharge: 2017-11-12 | Payer: MEDICARE

## 2017-11-11 DIAGNOSIS — Z4822 Encounter for aftercare following kidney transplant: Principal | ICD-10-CM

## 2017-11-11 DIAGNOSIS — N179 Acute kidney failure, unspecified: Principal | ICD-10-CM

## 2017-11-11 LAB — CBC
HEMATOCRIT: 36.8 % — ABNORMAL LOW (ref 41.0–53.0)
MEAN CORPUSCULAR HEMOGLOBIN CONC: 31.2 g/dL (ref 31.0–37.0)
MEAN CORPUSCULAR HEMOGLOBIN: 31.4 pg (ref 26.0–34.0)
MEAN CORPUSCULAR VOLUME: 100.6 fL — ABNORMAL HIGH (ref 80.0–100.0)
PLATELET COUNT: 300 10*9/L (ref 150–440)
RED BLOOD CELL COUNT: 3.65 10*12/L — ABNORMAL LOW (ref 4.50–5.90)
RED CELL DISTRIBUTION WIDTH: 15.7 % — ABNORMAL HIGH (ref 12.0–15.0)
WBC ADJUSTED: 4.7 10*9/L (ref 4.5–11.0)

## 2017-11-11 LAB — CBC W/ AUTO DIFF
BASOPHILS RELATIVE PERCENT: 0.9 %
EOSINOPHILS ABSOLUTE COUNT: 0.1 10*9/L (ref 0.0–0.4)
EOSINOPHILS RELATIVE PERCENT: 1.1 %
HEMOGLOBIN: 11.4 g/dL — ABNORMAL LOW (ref 13.5–17.5)
LARGE UNSTAINED CELLS: 1 % (ref 0–4)
LYMPHOCYTES ABSOLUTE COUNT: 0.2 10*9/L — ABNORMAL LOW (ref 1.5–5.0)
LYMPHOCYTES RELATIVE PERCENT: 3.5 %
MEAN CORPUSCULAR HEMOGLOBIN CONC: 31 g/dL (ref 31.0–37.0)
MEAN CORPUSCULAR HEMOGLOBIN: 31.2 pg (ref 26.0–34.0)
MEAN CORPUSCULAR VOLUME: 100.4 fL — ABNORMAL HIGH (ref 80.0–100.0)
MEAN PLATELET VOLUME: 8 fL (ref 7.0–10.0)
MONOCYTES ABSOLUTE COUNT: 0.1 10*9/L — ABNORMAL LOW (ref 0.2–0.8)
MONOCYTES RELATIVE PERCENT: 1.8 %
NEUTROPHILS ABSOLUTE COUNT: 4.9 10*9/L (ref 2.0–7.5)
NEUTROPHILS RELATIVE PERCENT: 92.1 %
PLATELET COUNT: 321 10*9/L (ref 150–440)
RED BLOOD CELL COUNT: 3.66 10*12/L — ABNORMAL LOW (ref 4.50–5.90)
RED CELL DISTRIBUTION WIDTH: 15.7 % — ABNORMAL HIGH (ref 12.0–15.0)
WBC ADJUSTED: 5.4 10*9/L (ref 4.5–11.0)

## 2017-11-11 LAB — RED CELL DISTRIBUTION WIDTH: Lab: 15.7 — ABNORMAL HIGH

## 2017-11-11 LAB — APTT: Coagulation surface induced:Time:Pt:PPP:Qn:Coag: 40.7 — ABNORMAL HIGH

## 2017-11-11 LAB — BASIC METABOLIC PANEL
ANION GAP: 9 mmol/L (ref 9–15)
BLOOD UREA NITROGEN: 30 mg/dL — ABNORMAL HIGH (ref 7–21)
BUN / CREAT RATIO: 15
CALCIUM: 10.1 mg/dL (ref 8.5–10.2)
CHLORIDE: 105 mmol/L (ref 98–107)
CO2: 23 mmol/L (ref 22.0–30.0)
CREATININE: 1.96 mg/dL — ABNORMAL HIGH (ref 0.70–1.30)
EGFR MDRD AF AMER: 42 mL/min/{1.73_m2} — ABNORMAL LOW (ref >=60)
EGFR MDRD NON AF AMER: 35 mL/min/{1.73_m2} — ABNORMAL LOW (ref >=60)
GLUCOSE RANDOM: 113 mg/dL — ABNORMAL HIGH (ref 65–99)
SODIUM: 137 mmol/L (ref 135–145)

## 2017-11-11 LAB — EGFR MDRD AF AMER
Glomerular filtration rate/1.73 sq M.predicted.black:ArVRat:Pt:Ser/Plas/Bld:Qn:Creatinine-based formula (MDRD): 42 — ABNORMAL LOW

## 2017-11-11 LAB — PLATELET COUNT: Lab: 300

## 2017-11-11 LAB — INR: Lab: 1.08

## 2017-11-11 LAB — SMEAR REVIEW: Lab: 0

## 2017-11-11 NOTE — Unmapped (Signed)
1st pass

## 2017-11-11 NOTE — Unmapped (Signed)
Procedure complete.  Band-aid and abd binder applied.

## 2017-11-11 NOTE — Unmapped (Signed)
3rd pass

## 2017-11-11 NOTE — Unmapped (Signed)
2nd pass

## 2017-11-11 NOTE — Unmapped (Deleted)
RESUMEN DE LA VISITA  [facility] N??m. de expediente: [id number]    11/11/2017??  ??IMG ULTRASOUND [facility]    [facility]    [phone]     Instrucciones   Hable con su m??dico sobre sus medicamentos  PREGUNTE c??mo usar:   amLODIPine??10 MG tablet??(NORVASC)??    MYFORTIC??180 MG EC tablet??    PROGRAF??1 MG capsule??    sulfamethoxazole-trimethoprim??400-80 mg per tablet??(BACTRIM,SEPTRA)??    valGANciclovir??450 mg tablet??(VALCYTE)??     Revise la lista de medicamentos actualizada a continuaci??n.       Instrucciones [employer]    Instrucciones despu??s de una biopsia de ri????n    Le hicieron una biopsia de ri????n para ayudar a diagnosticar la afecci??n renal que tiene.    Los resultados de la biopsia se tardar??n varios d??as para completar.  Su nefr??logo [employer] dar?? los [place].  Si tiene preguntas o inquietudes acerca de la biopsia, llame a la [facility] al [phone] y pida que llame al buscapersonas del nefr??logo en formaci??n de turno.    Siga las instrucciones a continuaci??n:  ? Economist para Chief Technology Officer o la ansiedad durante la biopsia, no debe manejar ni operar maquinaria pesada por 24 horas.  ? Evite cualquier actividad f??sica pesada durante 3 semanas.  ? No firme documentos legales el d??a del procedimiento.  ? No tome bebidas alcoh??licas el d??a del procedimiento.  ? No maneje ni opere maquinaria pesada el d??a del procedimiento.  ? No levante m??s de 15 libras ni haga esfuerzo por 3 semanas.  ? No tome ning??n medicamento antiinflamatorio no esteroide (ibuprofen, Motrin, Aleve) ni aspirina durante los pr??ximos 7 d??as, a menos que el m??dico le haya dicho algo diferente.  ? Mantenga el vendaje puesto, limpio, y seco por 2 d??as.  Se puede duchar despu??s de 2 d??as.  Despu??s de ducharse, quite el vendaje y limpie la zona con agua jabonosa tibia.  Ponga un vendaje nuevo. Repita este proceso hasta que la zona de la biopsia haya sanado.  No debe ba??arse en la ba??era, ni en una ba??era de hidromasaje, ni nadar hasta que la zona haya sanado.  ? Si tiene [employer] orina que no parece que est?? disminuyendo cada vez que orine, dificultad para Geographical information systems officer, aumento de dolor de [place], o [place], llame a la operadora de [facility] al [phone] y pida que llame al buscapersonas del nefr??logo en formaci??n de turno.    Si hay alg??n cambio repentino o marcado en c??mo se siente, vaya a la sala de emergencias m??s cercana o llame al 911.    Seguimiento  No tiene ninguna cita pendiente.    Motivo de la hospitalizaci??n  Su diagn??stico primario fue: No se encuentra en los archivos     M??dicos que lo atendieron durante la hospitalizaci??n  Proveedor [facility]   Neysa Hotter, MD -- M??dica supervisora Nefrolog??a     Es al??rgico a lo siguiente  Al??rgeno Reacci??n   Code??na Hinchaz??n       Lista de medicamentos  PREGUNTE a su m??dico sobre [employer]      Por la ma??ana Por la tarde Por la noche A la hora de irse a dormir Cuando sea necesario   PREGUNTE:  amLODIPine 10 MG tablet   Com??nmente conocido como: NORVASC   Tome 0.5 tabletas (5 mg total) por v??a oral cada noche.  PREGUNTE:  MYFORTIC 180 MG EC tablet   Tome 3 tabletas (540 mg total) por v??a oral dos (2) veces al d??a.   Medicamento gen??rico: mycophenolate                PREGUNTE:  PROGRAF 1 MG capsule   Tome 4 c??psulas (4 mg total) por v??a oral dos (2) veces al d??a.   Medicamento gen??rico: tacrolimus                PREGUNTE:  sulfamethoxazole-trimethoprim 400-80 mg per tablet   Com??nmente conocido como: BACTRIM,SEPTRA   Tome 1 tableta (80 mg of trimethoprim total) por v??a oral cada lunes, mi??rcoles y viernes.                PREGUNTE:  valGANciclovir 450 mg tablet   Com??nmente conocido como: VALCYTE   Tome 1 tableta (450 mg total) por v??a oral cada lunes, mi??rcoles y viernes.                  MyChart  ??Env??e mensajes al m??dico, revise los resultados de [place] m??dicas, renueve las recetas, haga citas y Arvella Merles m??s!    Vaya a [email or web address] y haga clic en ??Activate Your Account??. [place] su c??digo de activaci??n de My [place] Chart exactamente como aparece a continuaci??n junto con su fecha de nacimiento para completar el proceso de activaci??n.     Mi c??digo de activaci??n de My [place] Chart: [id number]  Franco Nones de vencimiento: 02/03/2018 11:38 AM    Si necesita ayuda con My [place] Chart, llame a [facility] [facility] al [phone].    Care Everywhere CEID  [phone]: Este n??mero de identificaci??n se puede usar si otra instalaci??n m??dica que [employer] el programa Epic necesita solicitar el expediente m??dico de [place].    Aprendizaje sobre el uso seguro de los antibi??ticos  Introducci??n  Los antibioticos son medicamentos utilizados para matar bacterias.  Las bacterias pueden causar infecciones. Estas incluyen faringoamigdalitis estreptoc??cica, infecciones de o??do, y neumon??a.   Estos medicamentos no lo curan todo.  No matan los virus ni [employer].  No alivian enfermedades como el resfriado com??n, la gripe, o goteo nasal.  Y pueden tener efectos secundarios.   Hay muchos tipos de antibi??ticos.  Su m??dico decidir?? cu??l funcionar?? mejor para su infecci??n. Los ejemplos incluyen:  ?? Amoxicilin  ?? Cephalexin (Keflex)  ?? Ciprofloxacin (Cipro)  ??Cu??les son los efectos secundarios potenciales?  Los efectos secundarios pueden incluir:  ?? N??useas  ?? Diarrea  ?? Erupci??n de la piel  ?? Candidiasis  ?? Neomia Dear reacci??n al??rgica grave. Puede causar comez??n, hinchaz??n, y dificultad para respirar. Esto es poco frecuente.   Puede que usted tenga otros efectos secundarios que no aparecen listados aqu??.  Revise la informaci??n adjunta con sus medicamentos.   ??Debe tomar antibi??ticos por si acaso?  No tome los antibi??ticos cuando no los necesite. Si lo hace, puede que no funcionen cuando los necesite.   Cada vez que tome antibi??ticos, est?? m??s propenso a que algunas de las bacterias sobrevivan y que el medicamento no las mate.  Las bacterias que no mueren pueden [place] y Environmental health practitioner a ser a??n m??s dif??cil matarlas.  Estas se conocen como bacterias resistentes a los antibi??ticos.  Estas pueden causar infecciones m??s graves y m??s duraderas.  Para tratarlas, puede que necesite de otros antibi??ticos m??s fuertes, con m??s efectos secundarios y m??s caros.   Siempre preg??ntele al m??dico si los antibi??ticos son Consulting civil engineer.  Explique que no quiere antibi??ticos a  no ser que sea necesario.   Ayude a proteger a la comunidad  El uso de antibi??ticos cuando no son necesarios causa el desarrollo de bacterias resistentes a los antibi??ticos. Estas bacterias m??s fuertes pueden propagarse a familiares, ni??os y compa??eros de trabajo.  La gente en la comunidad correr??a el riesgo de [employer] infecci??n m??s dif??cil de curar y el tratamiento cuesta m??s.  ??C??mo puede tomar antibi??ticos de Wellsite geologist segura?  Use los medicamentos de [employer]. Tome los antibi??ticos como se indique.  No los deje de tomar porque se siente mejor.  Necesita tomar el tratamiento completo del medicamento.  Esto ayudar?? a asegurar que la infecci??n se cure. Esto tambi??n ayudar?? a evitar la proliferaci??n de bacterias resistente a los antibi??ticos.   Siempre tome la cantidad exacta que indique la etiqueta.  Si la etiqueta indica que tiene que tomar el medicamento a Conservation officer, historic buildings hora, siga las instrucciones.   Es posible que se sienta mejor despu??s de tomar los antibi??ticos por unos pocos d??as.  Pero es importante que siga tom??ndolos por el tiempo que se los recetaron.  Esto le ayudar?? a deshacerse de las bacterias que son un poco m??s potentes y que sobreviven durante los primeros d??as del tratamiento.   ??D??nde puede aprender m??s?  Acuda a la p??gina [email or web address]  Marcelino Freestone [id number] en el recuadro de b??squeda para aprender m??s sobre ??Aprendizaje sobre el uso seguro de los antibi??ticos.??  Current as of: 30 de noviembre de 2016   Content Version: 11.2  ?? 2006-2017 Healthwise, Incorporated. Care instructions adapted under license by [facility]. Si tiene [place] pregunta sobre una afecci??n m??dica o sobre esta informaci??n, siempre consulte con un proveedor de atenci??n m??dica.  Healthwise Incorporated Customer service manager garant??a o responsabilidad del uso de esta informaci??n.     Informaci??n adjunta    Informaci??n de recursos ante una crisis:  L??neas directas nacionales de prevenci??n del suicidio:  1-800-SUICIDE [phone] en espa??ol o 1-800-273-TALK [phone]) en ingl??s    L??neas de atenci??n ante Neomia Dear crisis de [place] del [place]:   [phone]        [facility] N??m. de expediente: [phone]     CSN: [id number]   SA: [facility] ARE[facility] Report:-IP After Visit Summary      Al Sallye Ober, yo reconozco que recib?? y entiendo las instrucciones del alta precedentes y materiales educativos para el paciente adjuntos (si los hay).   By signing below, I acknowledge that I have received and understand the foregoing discharge instructions and accompanying patient education materials (if any).      ____________________________________________________________________  Chales Salmon del paciente/representante autorizado/adulto responsable  Signature of Patient/Authorized Representative/Responsible Adult      ____________________________________________________________________  Nemiah Commander de imprenta y relaci??n con el paciente:   Printed Name and Relationship to Patient      Franco Nones y hora: ________________________________________________________  Date and Time        ____________________________________________________________________  Sherald Hess la enfermera u otro proveedor:   Signature of Nurse or Other Provider      ____________________________________________________________________  Nemiah Commander de imprenta y credenciales:   Printed Name and Credentials      Fecha y hora: _________________________________________________________  Date and Time

## 2017-11-11 NOTE — Unmapped (Signed)
US Renal Transplant Biopsy Note  Date of Scheduled Biopsy: 11/11/17  Patient Name: Justin Mcknight  Justin: [id number]  Age: 62 y.o.  Gender: Male  Race: Hispanic  ----------------------------------------------------------------------------------------------------------------------  Date of allograft implantation: 09/03/17  Underlying native kidney disease: hypertension  Was the diagnosis established by biopsy? no   Previous transplant biopsies? yes   If yes, what were the previous diagnoses? Zero Hour-  focal mild thrombotic microangiopathy  Previous kidney transplants: no If yes, this is #:   Biopsy Indication: Diagnostic    Justin Mcknight is a 62 year old gentleman who is seen in follow up post kidney transplant 09/03/2017. his post transplant course was complicated by an acute drop in hemoglobin followed by an ultrasound which revealed hematomas on the kidney. pt was admitted on 09/12/2017 and then discharged the next day (09/13/2017) in stable condition.   ??  s/p kidney transplant 09/03/2017. baseline creatinine 2.5-3 mg/dl. no proteinuria. no donor specific hla ab detected. no changes in maintenance immunosuppression; potential transition to belatacept if kidney function does not cont to improve. tacrolimus lvl 6-8 ng/dl.  ----------------------------------------------------------------------------------------------------------------------  Current Baseline Immunosuppression: FK506/Tacrolimus  Specific anti-rejection treatment before biopsy: no   Patient off immunosuppression?: no   Patient seems compliant? yes   Patient is currently back on hemodialysis? no   ----------------------------------------------------------------------------------------------------------------------  Evidence of allo-antibodies? no     Blood Pressure (mmHg):   BP Readings from Last 3 Encounters:   11/05/17 130/78   11/05/17 130/78   10/11/17 112/75     Urinalysis: )  Lab Results   Component Value Date    COLORU Yellow 11/05/2017    COLORU Light-Yellow 03/16/2011    PROTEINUR 34.3 11/05/2017    GLUCOSEU Trace (A) 11/05/2017    GLUCOSEU NEGATIVE 03/16/2011    KETONESU Negative 11/05/2017    KETONESU NEGATIVE 03/16/2011    BLOODU Negative 11/05/2017    BLOODU +1 03/16/2011    NITRITE Negative 11/05/2017    NITRITE NEGATIVE 03/16/2011   ]  Creatinine (present peak): 2.5  Creatinine (baseline): 2.5 (never improved after transplant)  Lab Results   Component Value Date    CREATININE 2.29 (H) 11/04/2017    CREATININE 2.38 (H) 11/01/2017    CREATININE 2.49 (H) 10/30/2017    CREATININE 2.40 (H) 10/25/2017    CREATININE 2.57 (H) 10/23/2017     ----------------------------------------------------------------------------------------------------------------------  Clinical signs of infections at time of current biopsy:  BK: no   CMV: no   Herpes: no   Hepatitis B: no   Hepatitis C: no   Bacteria: no   Fungi: no   Urinary tract infection: no   ----------------------------------------------------------------------------------------------------------------------  Stenosis of renal artery: no   Obstruction of ureter: no   Lymphocele: no   -------------------------------------------------------------------------------------------    4210 SURGICAL PATHOLOGY REQUEST FORM  Special Stains Requested: LM, EM, IF  DATE      CHC Test#  CPT Code Description   4250  88331  FROZEN SECTION - SINGLE   4251  88332  FROZEN SECTIO - ADDITIONAL         4227  88300  GROSS ONLY (NO SECTION)   4228  88302  LEVEL II - GROSS & MICRO EXAM   4229  88304  LEVEL III - GROSS & MICRO EXAM   4230  88305  LEVEL IV - GROSS & MICRO EXAM   4231  88307  LEVEL V - GROSS & MICRO EXAM   4232  34742  LEVEL VI - GROSS &  MICRO EXAM   4233  [id number]  DECALCIFICATION   4234  88321  CONSULT ON REFERRED SLIDES   4235  88323  CONSULT WITH SLIDE PREP   4236  [phone]  CONSULT DURING SURGERY   4237  [phone]  BONE MARROW ASPIRATION     DEPT. USE ONLY     CODE QTY MISCELLANEOUS (SPECIFY) AMOUNT         =

## 2017-11-11 NOTE — Unmapped (Signed)
Assessment/Plan:    Mr. Achilles Neville is a 62 y.o. male who will undergo Ultrasound guided transplant kidney biopsy    1. Indications and risks/benefits of procedure reviewed with patient.    2. Consent signed and present on patient's charge.   3. No cardiopulmonary or other medical contraindications present therefore will proceed with biopsy.       CC: No chief complaint on file.      HPI: Mr. Uday Jantz is a 62 y.o. male who will undergo  Ultrasound guided transplant kidney biopsy with moderate sedation.    Allergies:   Allergies   Allergen Reactions   ??? Codeine Swelling       Medications:   Current Outpatient Medications   Medication Sig Dispense Refill   ??? amLODIPine (NORVASC) 10 MG tablet Take 0.5 tablets (5 mg total) by mouth every evening. 90 tablet 4   ??? MYFORTIC 180 mg EC tablet Take 3 tablets (540 mg total) by mouth Two (2) times a day. 180 tablet 11   ??? PROGRAF 1 mg capsule Take 4 capsules (4 mg total) by mouth two (2) times a day. 240 capsule 11   ??? sulfamethoxazole-trimethoprim (BACTRIM,SEPTRA) 400-80 mg per tablet Take 1 tablet (80 mg of trimethoprim total) by mouth Every Monday, Wednesday, and Friday. 12 tablet 1   ??? valGANciclovir (VALCYTE) 450 mg tablet Take 1 tablet (450 mg total) by mouth Every Monday, Wednesday, and Friday. 30 tablet 2     No current facility-administered medications for this encounter.        PMH:   Past Medical History:   Diagnosis Date   ??? Anemia of chronic disease    ??? Cough 11/04/2012   ??? End stage renal disease on dialysis (CMS-HCC)     started 09/2010   ??? Gout    ??? Hepatic steatosis    ??? Hypertension    ??? Patient awaiting renal transplant 09/03/2017   ??? Renal cell carcinoma (CMS-HCC)     03/2011   ??? Secondary hyperparathyroidism of renal origin (CMS-HCC)    ??? Shortness of breath 11/04/2012       ASA Grade: ASA 2 - Patient with mild systemic disease with no functional limitations    ROS:  General: Denies fever or chills.  Cardiovascular: Denies chest pain.   Pulmonary: Denies shortness of breath, snoring, sleep apnea, or respiratory infection.    Allergies:   Allergies   Allergen Reactions   ??? Codeine Swelling           PE:    Vitals:    11/11/17 0800   BP: 129/94   Pulse: 85   Resp: 16   Temp: 36.6 ??C   SpO2: 100%     General:  Well appearing male in NAD.  Airway assessment: Class 1 - Can visualize soft palate, fauces, uvula, and tonsillar pillars  Cardiovascular:  Regular rate and rhythm.  No murmurs, gallops or rubs.   Lungs: respirations nonlabored; clear to auscultation bilaterally

## 2017-11-11 NOTE — Unmapped (Addendum)
RESUMEN DE LA VISITA  [facility]    N??m. de expediente: [phone]    6 de mayo de 2019   IMG ULTRASOUND [facility]??      [facility]    [phone]     Instrucciones     Hable con el proveedor de atenci??n m??dica acerca de los medicamentos    PREGUNTE c??mo usar:      ?? amLODIPine??10 MG tablet??(NORVASC)??   ?? MYFORTIC??180 MG EC tablet??   ?? PROGRAF??1 MG capsule??   ?? sulfamethoxazole-trimethoprim??400-80 mg per tablet??(BACTRIM,SEPTRA)??   ?? valGANciclovir??450 mg tablet??(VALCYTE)??         Instrucciones  acerca de las actividades:      Instrucciones despu??s de una biopsia de ri????n    Le hicieron una biopsia de ri????n para ayudar a diagnosticar la afecci??n renal que tiene.    Los resultados de la biopsia se tardar??n varios d??as para completar.  Su nefr??logo [employer] dar?? [place].  Si tiene preguntas o inquietudes acerca de la biopsia, llame a la [facility] al [phone] y pida que llame al buscapersonas del nefr??logo en formaci??n de turno.    Siga las instrucciones a continuaci??n:  ? Economist para Chief Technology Officer o la ansiedad durante la biopsia, no debe manejar ni operar maquinaria pesada por 24 horas.  ? Evite cualquier actividad f??sica pesada durante 3 semanas.  ? No firme documentos legales el d??a del procedimiento.  ? No tome bebidas alcoh??licas el d??a del procedimiento.  ? No maneje ni opere maquinaria pesada el d??a del procedimiento.  ? No levante m??s de 15 libras ni haga esfuerzo por 3 semanas.  ? No tome ning??n medicamento antiinflamatorio no esteroide (ibuprofen, Motrin, Aleve) ni aspirina durante los pr??ximos 7 d??as, a menos que el m??dico le haya dicho algo diferente.  ? Mantenga el vendaje puesto, limpio, y seco por 2 d??as.  Se puede duchar despu??s de 2 d??as.  Despu??s de ducharse, quite el vendaje y limpie la zona con agua jabonosa tibia.  Ponga un vendaje nuevo. Repita este proceso hasta que la zona de la biopsia haya sanado.  No debe ba??arse en la ba??era, ni en una ba??era de hidromasaje, ni nadar hasta que la zona haya sanado.  ? Si tiene [employer] orina que no parece que est?? disminuyendo cada vez que orine, dificultad para Geographical information systems officer, aumento de dolor de [place], o [place], llame a la operadora de [facility] al [phone] y pida que llame al buscapersonas del nefr??logo en formaci??n de turno.    Si hay alg??n cambio repentino o marcado en c??mo se siente, vaya a la sala de emergencias m??s cercana o llame al 911.                       Seguimiento    Actualmente NO tiene citas de seguimiento programadas.          Motivo de la hospitalizaci??n    Su diagn??stico primario fue: No se encuentra en el expediente   Sus diagn??sticos tambi??n incluyeron:        M??dicos que lo atendieron durante la hospitalizaci??n    Proveedor Servicio Funci??n Especialidad   Neysa Hotter, MD  Doctora a cargo Neolog??a       Es al??rgico a lo siguiente    Al??rgeno Reacci??n   Code??na Hinchaz??n         Lista de medicamentos    PREGUNTE a su m??dico  sobre estos medicamentos      Por la ma??Sherrell Weir Por la tarde Por la noche A la hora de irse a dormir Cuando sea necesario   PREGUNTE    amLODIPine 10 MG tablet  Com??nmente conocido como: NORVASC   Tomar 0.5 tabletas (5 mg en total) por v??a oral cada noche.                 PREGUNTE    MYFORTIC 180 MG EC tablet   Tomar 3 tabletas (540 mg en total) por v??a oral dos (2) veces al d??a.   Medicamento gen??rico: micofenolato                PREGUNTE    PROGRAF 1 MG capsule  Tomar 4 c??psulas (4 mg en total) por v??a oral dos (2) veces al d??a.   Medicamento gen??rico: tacrolimus                PREGUNTE    sulfamethoxazole-  trimethoprim 400-80 mg per tablet  Com??nmente conocido como: BACTRIM, SEPTRA   Tomar 1 tableta (80 mg de trimetoprim total) por v??a oral todos los lunes, mi??rcoles y viernes.                PREGUNTE    valGANciclovir 450 mg tablet   Com??nmente conocido como: VALCYTE   Tomar 1 tableta (450 mg en total) por v??a oral todos los lunes, mi??rcoles y viernes.                    MyChart    ??Env??e mensajes al m??dico, revise los resultados de [place] m??dicas, renueve las recetas, haga citas y Arvella Merles m??s!    Vaya a [email or web address] y haga clic en ??Activate Your Account??. [place] su c??digo de activaci??n de My [place] Chart exactamente como aparece a continuaci??n junto con su fecha de nacimiento para completar el proceso de activaci??n.     Mi c??digo de activaci??n de My [place] Chart: [id number]  [place] el: 29 de julio de 2019 11:38 AM        Si necesita ayuda con My [place] Chart, llame a [facility] al [phone].    Care Everywhere CEID  [phone]: Este n??mero de identificaci??n se puede usar si otra instalaci??n m??dica que [employer] el programa Epic necesita solicitar el expediente m??dico de [place].     Informaci??n adjunta    Informaci??n de recursos ante una crisis:  L??neas directas nacionales de prevenci??n del suicidio:  1-800-SUICIDE [phone] en espa??ol o 1-800-273-TALK [phone]) en ingl??s    L??neas de atenci??n ante Neomia Dear crisis de [place]:   [phone]        Aprender Tacy Dura el uso seguro de los antibi??ticos    Aprender sobre el uso seguro de los antibi??ticos  Introducci??n  Los antibi??ticos son medicamentos utilizados para matar bacterias.  Las bacterias pueden causar infecciones. Estas incluyen faringoamigdalitis estreptoc??cica, infecciones de o??do, y pulmon??a.   Estos medicamentos no lo curan todo.  No matan los virus ni [employer].  No alivian enfermedades como el resfriado com??n, la gripe, o goteo nasal.  Y pueden tener efectos secundarios.   Hay muchos tipos de antibi??ticos.  Su m??dico decidir?? cu??l funcionar?? mejor para su infecci??n. Los ejemplos incluyen:  ?? Amoxicillin,  ?? Cephalexin (Keflex), y  ?? Ciprofloxacin (Cipro).  ??Cu??les son los efectos secundarios potenciales?  Los efectos secundarios pueden incluir:  ?? N??useas,  ?? Diarrea;  ?? Erupci??n de la piel,  ?? Candidiasis, y  ??  Neomia Dear reacci??n al??rgica severa.  Puede causar comez??n, hinchaz??n y dificultad para respirar. Esto es poco frecuente.   Usted podr??a tener otros efectos secundarios que no aparecen listados aqu??.  Revise la informaci??n adjunta con sus medicamentos.   ??Debe tomar antibi??ticos por si acaso?  No tome antibi??ticos cuando no los necesite. Si lo hace, puede que no funcionen cuando los necesite.   Cada vez que tome antibi??ticos, est?? m??s propenso a que algunas de las bacterias sobrevivan y que no sean matadas con el medicamento.  Las bacterias que no mueren pueden [place] y Environmental health practitioner a ser a??n m??s dif??ciles de Wellsite geologist.  Estas se conocen como bacterias resistentes a los antibi??ticos.  Esas bacterias pueden causar infecciones m??s graves y m??s duraderas.  Para tratarlas, puede que necesite de otros antibi??ticos m??s fuertes, con m??s efectos secundarios y m??s costosos.   Siempre preg??ntele al m??dico si los antibi??ticos son Consulting civil engineer.  Explique que no quiere antibi??ticos a no ser que sea necesario.   Ayude a proteger la comunidad.  El uso de antibi??ticos cuando no son necesarios causa el desarrollo de bacterias resistentes a los antibi??ticos.  Estas bacterias m??s fuertes pueden propagarse a familiares, ni??os y compa??eros de trabajo.  La gente en la comunidad correr??a el riesgo de contraer una infecci??n m??s dif??cil de curar y m??s costoso de tratar.   ??C??mo puede tomar antibi??ticos a salvo?  Use los medicamentos de [employer]. Tome los antibi??ticos como se indique.  No los deje de tomar antes de tiempo porque ya se siente mejor.  Necesita tomar el tratamiento completo del medicamento.  Esto ayuda a asegurar que la infecci??n se cure. Esto tambi??n ayuda a evitar la proliferaci??n de bacterias resistentes a los antibi??ticos.   Siempre tome la cantidad exacta que indique la etiqueta.  Si la etiqueta indica que tiene que tomar el medicamento a Conservation officer, historic buildings hora, siga las instrucciones.   Es posible que se sienta mejor despu??s de tomar los antibi??ticos por unos pocos d??as.  Pero es importante que siga tom??ndolos por el tiempo completo que fueron recetados.  Esto le ayudar?? a deshacerse de las bacterias que son un poco m??s potentes y que sobreviven durante los primeros d??as del tratamiento.   ??D??nde puede aprender m??s?  Acuda a la p??gina [email or web address]  Marcelino Freestone [id number] en el recuadro de b??squeda para aprender m??s sobre ??Aprender sobre el uso seguro de los antibi??ticos??.  Current as of: May 31, 2015  Content Version: 11.2  ?? 2006-2017 Healthwise, Incorporated. Care instructions adapted under license by [facility]. Si tiene [place] pregunta sobre una afecci??n m??dica o sobre esta informaci??n, siempre consulte con un proveedor de atenci??n m??dica.  Healthwise Incorporated Customer service manager garant??a o responsabilidad del uso de esta informaci??n.        [facility] N??m. de expediente: [phone]     CSN: [id number]   SA: [facility] SERVICE ARE[facility] Report:-IP After Visit Summary      Al Sallye Ober, yo reconozco que recib?? y entiendo las instrucciones del alta precedentes y materiales educativos para el paciente adjuntos (si los hay).   By signing below, I acknowledge that I have received and understand the foregoing discharge instructions and accompanying patient education materials (if any).      ___________________________________________________  Chales Salmon del paciente/representante autorizado/adulto responsable  Signature of Patient/Authorized Representative/Responsible Adult       _________________________________________________   Nemiah Commander de imprenta y relaci??n con el paciente  Printed Name and Relationship to Patient  _________________________________________________  Franco Nones y hora  Date and Time       _________________________________________________   Sherald Hess la enfermera u otro proveedor  Signature of Nurse or Other Provider       ________________________________________________   Nemiah Commander de imprenta y credenciales  Printed Name and Credentials       ________________________________________________  Georgette Dover  Date and Time

## 2017-11-11 NOTE — Unmapped (Signed)
KIDNEY BIOPSY PROCEDURE NOTE    INDICATIONS:  Assess for rejection     CONSENT/TIME OUT:    Risks, benefits and alternatives including blood loss requiring transfusion, loss of kidney or kidney function, and death were discussed with patient.  Written informed consent was obtained prior to the procedure and is detailed in the medical record.  Prior to the start of the procedure, a time out was taken and the identity of the patient was confirmed via name, medical record number and date of birth.  The availability of the correct equipment was verified.    PROCEDURE:  Name: Transplant Kidney Biopsy  Description: Ultrasound guided transplant kidney biopsy     Pre-Procedure blood specimens were sent for CBC, PT/aPTT, and type & screen.     The patient was given 1 mg of midazolam and 25 mcg of fentanyl during the procedure, and 10 mL lidocaine 1% were used for local anesthesia. Under ultrasound guidance, a 16cm 16G biopsy needle was inserted into the right kidney for a total of 2 passes with 2 core specimens obtained for analysis.      COMPLICATIONS:  Complications:  Post-procedure ultrasound shows possible small hematoma, no bleeding.  Patient asymptomatic and well   Complications of the procedure: none     The patient will be closely watched in the recovery area, kept flat for 6 hours while wearing an abdominal binder, and will have a repeat CBC and ultrasound in 4 hours to ensure no hemodynamically significant bleeding post-biopsy.    SPECIMEN(S):  Core #1: 1.9 x 0.1 x 0.1 (LM 1.6, IF 0.3)  Core #2: 1.6 x 0.1 x 0.1 (EM 0.2, LM 1.4)

## 2017-11-13 ENCOUNTER — Other Ambulatory Visit
Admission: RE | Admit: 2017-11-13 | Discharge: 2017-11-13 | Disposition: A | Discharge status: Home / Self Care | Payer: Medicare Other | Source: Ambulatory Visit | Attending: Nephrology | Admitting: Nephrology

## 2017-11-13 DIAGNOSIS — E1129 Type 2 diabetes mellitus with other diabetic kidney complication: Secondary | ICD-10-CM | POA: Diagnosis not present

## 2017-11-13 DIAGNOSIS — N390 Urinary tract infection, site not specified: Secondary | ICD-10-CM | POA: Diagnosis not present

## 2017-11-13 DIAGNOSIS — Z09 Encounter for follow-up examination after completed treatment for conditions other than malignant neoplasm: Secondary | ICD-10-CM | POA: Diagnosis not present

## 2017-11-13 DIAGNOSIS — D631 Anemia in chronic kidney disease: Secondary | ICD-10-CM | POA: Insufficient documentation

## 2017-11-13 DIAGNOSIS — Z79899 Other long term (current) drug therapy: Secondary | ICD-10-CM | POA: Diagnosis present

## 2017-11-13 DIAGNOSIS — Z9483 Pancreas transplant status: Secondary | ICD-10-CM | POA: Insufficient documentation

## 2017-11-13 DIAGNOSIS — Z114 Encounter for screening for human immunodeficiency virus [HIV]: Secondary | ICD-10-CM | POA: Diagnosis not present

## 2017-11-13 DIAGNOSIS — Z789 Other specified health status: Secondary | ICD-10-CM | POA: Diagnosis not present

## 2017-11-13 DIAGNOSIS — Z940 Kidney transplant status: Secondary | ICD-10-CM | POA: Diagnosis present

## 2017-11-13 DIAGNOSIS — D899 Disorder involving the immune mechanism, unspecified: Secondary | ICD-10-CM | POA: Diagnosis present

## 2017-11-13 DIAGNOSIS — E559 Vitamin D deficiency, unspecified: Secondary | ICD-10-CM | POA: Diagnosis not present

## 2017-11-13 LAB — CBC WITH DIFFERENTIAL/PLATELET
BASOS ABS: 0 10*3/uL (ref 0–0.1)
Basophils Relative: 0 %
EOS PCT: 1 %
Eosinophils Absolute: 0.1 10*3/uL (ref 0–0.7)
HCT: 36.2 % — ABNORMAL LOW (ref 40.0–52.0)
Hemoglobin: 11.8 g/dL — ABNORMAL LOW (ref 13.0–18.0)
Lymphocytes Relative: 3 %
Lymphs Abs: 0.2 10*3/uL — ABNORMAL LOW (ref 1.0–3.6)
MCH: 32.3 pg (ref 26.0–34.0)
MCHC: 32.7 g/dL (ref 32.0–36.0)
MCV: 98.8 fL (ref 80.0–100.0)
MONO ABS: 0.1 10*3/uL — AB (ref 0.2–1.0)
Monocytes Relative: 3 %
Neutro Abs: 5.5 10*3/uL (ref 1.4–6.5)
Neutrophils Relative %: 93 %
PLATELETS: 297 10*3/uL (ref 150–440)
RBC: 3.66 MIL/uL — AB (ref 4.40–5.90)
RDW: 16.1 % — AB (ref 11.5–14.5)
WBC: 5.9 10*3/uL (ref 3.8–10.6)

## 2017-11-13 LAB — BASIC METABOLIC PANEL
ANION GAP: 7 (ref 5–15)
BUN: 37 mg/dL — ABNORMAL HIGH (ref 6–20)
CALCIUM: 9.3 mg/dL (ref 8.9–10.3)
CO2: 23 mmol/L (ref 22–32)
Chloride: 105 mmol/L (ref 101–111)
Creatinine, Ser: 2.38 mg/dL — ABNORMAL HIGH (ref 0.61–1.24)
GFR calc non Af Amer: 28 mL/min — ABNORMAL LOW (ref >60)
GFR, EST AFRICAN AMERICAN: 32 mL/min — AB (ref >60)
Glucose, Bld: 132 mg/dL — ABNORMAL HIGH (ref 65–99)
Potassium: 5 mmol/L (ref 3.5–5.1)
SODIUM: 135 mmol/L (ref 135–145)

## 2017-11-13 LAB — MAGNESIUM: MAGNESIUM: 1.6 mg/dL — AB (ref 1.7–2.4)

## 2017-11-13 LAB — PHOSPHORUS: Phosphorus: 3.1 mg/dL (ref 2.5–4.6)

## 2017-11-13 NOTE — Unmapped (Signed)
See the Tourist information centre manager for documentation.  Carmin Muskrat

## 2017-11-14 LAB — TACROLIMUS LEVEL: TACROLIMUS (FK506) - LABCORP: 12.7 ng/mL (ref 2.0–20.0)

## 2017-11-15 ENCOUNTER — Other Ambulatory Visit
Admission: RE | Admit: 2017-11-15 | Discharge: 2017-11-15 | Disposition: A | Discharge status: Home / Self Care | Payer: Medicare Other | Source: Ambulatory Visit | Attending: Nephrology | Admitting: Nephrology

## 2017-11-15 DIAGNOSIS — E669 Obesity, unspecified: Secondary | ICD-10-CM | POA: Diagnosis not present

## 2017-11-15 DIAGNOSIS — Z79899 Other long term (current) drug therapy: Secondary | ICD-10-CM | POA: Diagnosis not present

## 2017-11-15 DIAGNOSIS — N189 Chronic kidney disease, unspecified: Secondary | ICD-10-CM | POA: Diagnosis not present

## 2017-11-15 DIAGNOSIS — Z940 Kidney transplant status: Secondary | ICD-10-CM | POA: Diagnosis not present

## 2017-11-15 DIAGNOSIS — Z9483 Pancreas transplant status: Secondary | ICD-10-CM | POA: Diagnosis not present

## 2017-11-15 DIAGNOSIS — Z09 Encounter for follow-up examination after completed treatment for conditions other than malignant neoplasm: Secondary | ICD-10-CM | POA: Insufficient documentation

## 2017-11-15 DIAGNOSIS — Z114 Encounter for screening for human immunodeficiency virus [HIV]: Secondary | ICD-10-CM | POA: Insufficient documentation

## 2017-11-15 DIAGNOSIS — N390 Urinary tract infection, site not specified: Secondary | ICD-10-CM | POA: Diagnosis not present

## 2017-11-15 DIAGNOSIS — D899 Disorder involving the immune mechanism, unspecified: Secondary | ICD-10-CM | POA: Insufficient documentation

## 2017-11-15 DIAGNOSIS — B259 Cytomegaloviral disease, unspecified: Secondary | ICD-10-CM | POA: Insufficient documentation

## 2017-11-15 DIAGNOSIS — E1129 Type 2 diabetes mellitus with other diabetic kidney complication: Secondary | ICD-10-CM | POA: Diagnosis not present

## 2017-11-15 DIAGNOSIS — Z789 Other specified health status: Secondary | ICD-10-CM | POA: Diagnosis not present

## 2017-11-15 DIAGNOSIS — D631 Anemia in chronic kidney disease: Secondary | ICD-10-CM | POA: Insufficient documentation

## 2017-11-15 LAB — CBC WITH DIFFERENTIAL/PLATELET
BASOS ABS: 0 10*3/uL (ref 0–0.1)
Basophils Relative: 0 %
EOS PCT: 1 %
Eosinophils Absolute: 0.1 10*3/uL (ref 0–0.7)
HCT: 35.7 % — ABNORMAL LOW (ref 40.0–52.0)
Hemoglobin: 12.2 g/dL — ABNORMAL LOW (ref 13.0–18.0)
LYMPHS ABS: 0.2 10*3/uL — AB (ref 1.0–3.6)
Lymphocytes Relative: 3 %
MCH: 33.4 pg (ref 26.0–34.0)
MCHC: 34.3 g/dL (ref 32.0–36.0)
MCV: 97.4 fL (ref 80.0–100.0)
MONO ABS: 0.1 10*3/uL — AB (ref 0.2–1.0)
Monocytes Relative: 2 %
Neutro Abs: 4.6 10*3/uL (ref 1.4–6.5)
Neutrophils Relative %: 94 %
PLATELETS: 270 10*3/uL (ref 150–440)
RBC: 3.66 MIL/uL — AB (ref 4.40–5.90)
RDW: 16.1 % — AB (ref 11.5–14.5)
WBC: 5 10*3/uL (ref 3.8–10.6)

## 2017-11-15 LAB — BASIC METABOLIC PANEL
ANION GAP: 6 (ref 5–15)
BUN: 37 mg/dL — ABNORMAL HIGH (ref 6–20)
CALCIUM: 9.7 mg/dL (ref 8.9–10.3)
CO2: 24 mmol/L (ref 22–32)
Chloride: 106 mmol/L (ref 101–111)
Creatinine, Ser: 2.26 mg/dL — ABNORMAL HIGH (ref 0.61–1.24)
GFR, EST AFRICAN AMERICAN: 34 mL/min — AB (ref >60)
GFR, EST NON AFRICAN AMERICAN: 30 mL/min — AB (ref >60)
Glucose, Bld: 127 mg/dL — ABNORMAL HIGH (ref 65–99)
Potassium: 5.4 mmol/L — ABNORMAL HIGH (ref 3.5–5.1)
SODIUM: 136 mmol/L (ref 135–145)

## 2017-11-15 LAB — MAGNESIUM: Magnesium: 1.7 mg/dL (ref 1.7–2.4)

## 2017-11-15 LAB — PHOSPHORUS: PHOSPHORUS: 2.9 mg/dL (ref 2.5–4.6)

## 2017-11-17 LAB — TACROLIMUS LEVEL: Tacrolimus (FK506) - LabCorp: 9.1 ng/mL (ref 2.0–20.0)

## 2017-11-18 ENCOUNTER — Other Ambulatory Visit
Admission: RE | Admit: 2017-11-18 | Discharge: 2017-11-18 | Disposition: A | Discharge status: Home / Self Care | Payer: Medicare Other | Source: Ambulatory Visit | Attending: Nephrology | Admitting: Nephrology

## 2017-11-18 DIAGNOSIS — N390 Urinary tract infection, site not specified: Secondary | ICD-10-CM | POA: Insufficient documentation

## 2017-11-18 DIAGNOSIS — Z9483 Pancreas transplant status: Secondary | ICD-10-CM | POA: Diagnosis not present

## 2017-11-18 DIAGNOSIS — Z940 Kidney transplant status: Secondary | ICD-10-CM | POA: Insufficient documentation

## 2017-11-18 DIAGNOSIS — Z09 Encounter for follow-up examination after completed treatment for conditions other than malignant neoplasm: Secondary | ICD-10-CM | POA: Diagnosis not present

## 2017-11-18 DIAGNOSIS — E789 Disorder of lipoprotein metabolism, unspecified: Secondary | ICD-10-CM | POA: Insufficient documentation

## 2017-11-18 DIAGNOSIS — D631 Anemia in chronic kidney disease: Secondary | ICD-10-CM | POA: Diagnosis not present

## 2017-11-18 DIAGNOSIS — B269 Mumps without complication: Secondary | ICD-10-CM | POA: Insufficient documentation

## 2017-11-18 DIAGNOSIS — Z79899 Other long term (current) drug therapy: Secondary | ICD-10-CM | POA: Diagnosis present

## 2017-11-18 DIAGNOSIS — Z114 Encounter for screening for human immunodeficiency virus [HIV]: Secondary | ICD-10-CM | POA: Insufficient documentation

## 2017-11-18 DIAGNOSIS — E1129 Type 2 diabetes mellitus with other diabetic kidney complication: Secondary | ICD-10-CM | POA: Diagnosis not present

## 2017-11-18 DIAGNOSIS — D899 Disorder involving the immune mechanism, unspecified: Secondary | ICD-10-CM | POA: Insufficient documentation

## 2017-11-18 DIAGNOSIS — E559 Vitamin D deficiency, unspecified: Secondary | ICD-10-CM | POA: Diagnosis not present

## 2017-11-18 LAB — BASIC METABOLIC PANEL
Anion gap: 6 (ref 5–15)
BUN: 37 mg/dL — AB (ref 6–20)
CALCIUM: 9.5 mg/dL (ref 8.9–10.3)
CO2: 26 mmol/L (ref 22–32)
CREATININE: 2.17 mg/dL — AB (ref 0.61–1.24)
Chloride: 104 mmol/L (ref 101–111)
GFR calc Af Amer: 36 mL/min — ABNORMAL LOW (ref >60)
GFR, EST NON AFRICAN AMERICAN: 31 mL/min — AB (ref >60)
Glucose, Bld: 128 mg/dL — ABNORMAL HIGH (ref 65–99)
Potassium: 5 mmol/L (ref 3.5–5.1)
Sodium: 136 mmol/L (ref 135–145)

## 2017-11-18 LAB — PHOSPHORUS
Lab: 2.9
Phosphorus: 2.8 mg/dL (ref 2.5–4.6)

## 2017-11-18 LAB — CBC WITH DIFFERENTIAL/PLATELET
Basophils Absolute: 0 10*3/uL (ref 0–0.1)
Basophils Relative: 0 %
EOS ABS: 0.1 10*3/uL (ref 0–0.7)
EOS PCT: 1 %
HCT: 36.5 % — ABNORMAL LOW (ref 40.0–52.0)
HEMOGLOBIN: 12.2 g/dL — AB (ref 13.0–18.0)
LYMPHS ABS: 0.2 10*3/uL — AB (ref 1.0–3.6)
Lymphocytes Relative: 4 %
MCH: 32.4 pg (ref 26.0–34.0)
MCHC: 33.3 g/dL (ref 32.0–36.0)
MCV: 97.5 fL (ref 80.0–100.0)
MONO ABS: 0.1 10*3/uL — AB (ref 0.2–1.0)
MONOS PCT: 3 %
Neutro Abs: 4.3 10*3/uL (ref 1.4–6.5)
Neutrophils Relative %: 92 %
PLATELETS: 287 10*3/uL (ref 150–440)
RBC: 3.75 MIL/uL — ABNORMAL LOW (ref 4.40–5.90)
RDW: 15.7 % — ABNORMAL HIGH (ref 11.5–14.5)
WBC: 4.7 10*3/uL (ref 3.8–10.6)

## 2017-11-18 LAB — MAGNESIUM
Lab: 1.7
Magnesium: 1.7 mg/dL (ref 1.7–2.4)

## 2017-11-18 LAB — COMPREHENSIVE METABOLIC PANEL
BLOOD UREA NITROGEN: 37 mg/dL — ABNORMAL HIGH
CHLORIDE: 106 mmol/L
CO2: 24 mmol/L
CREATININE: 2.26 mg/dL — ABNORMAL HIGH
EGFR MDRD AF AMER: 34 mL/min/{1.73_m2} — ABNORMAL LOW
EGFR MDRD NON AF AMER: 30 mL/min/{1.73_m2} — ABNORMAL LOW
GLUCOSE RANDOM: 127 mg/dL — ABNORMAL HIGH
POTASSIUM: 5.4 mmol/L — ABNORMAL HIGH
SODIUM: 136 mmol/L

## 2017-11-18 LAB — CHLORIDE: Lab: 106

## 2017-11-19 LAB — TACROLIMUS LEVEL: TACROLIMUS (FK506) - LABCORP: 10.3 ng/mL (ref 2.0–20.0)

## 2017-11-19 NOTE — Unmapped (Signed)
[facility] Refill Coordination Note  Specialty Medication(s): Myfortic 180 mg; Prograf 1 mg  Additional Medications shipped: none  *Spanish interpreter*    [facility], DOB: 1956/05/16  Phone: [phone] (home) , Alternate phone contact: N/A  Phone or address changes today?: No  All above HIPAA information was verified with patient.  Shipping Address: [address]  [place] [address]   Insurance changes? No    Completed refill call assessment today to schedule patient's medication shipment from the [facility] [phone]).      Confirmed the medication and dosage are correct and have not changed: Yes, regimen is correct and unchanged.    Confirmed patient started or stopped the following medications in the past month:  No, there are no changes reported at this time.    Are you tolerating your medication?:  Kennth reports tolerating the medication.    ADHERENCE    (Below is required for Medicare Part B or Transplant patients only - per drug):   Myfortic 180 mg   Quantity filled last month: 180   # of tablets left on hand: 7 DAYS    Prograf 1 mg   Quantity filled last month: 240   # of tablets left on hand: 7 DAYS        Did you miss any doses in the past 4 weeks? No missed doses reported.    FINANCIAL/SHIPPING    Delivery Scheduled: Yes, Expected medication delivery date: 11/21/17     The patient will receive an FSI print out for each medication shipped and additional FDA Medication Guides as required.  Patient education from [place] or [name] may also be included in the shipment    Gaje did not have any additional questions at this time.    Delivery address validated in FSI scheduling system: Yes, address listed in FSI is correct.    We will follow up with patient monthly for standard refill processing and delivery.      Thank you,  Burnett Corrente   [facility] Specialty PharmD Candidate

## 2017-11-20 ENCOUNTER — Other Ambulatory Visit
Admission: RE | Admit: 2017-11-20 | Discharge: 2017-11-20 | Disposition: A | Discharge status: Home / Self Care | Payer: Medicare Other | Source: Ambulatory Visit | Attending: Nephrology | Admitting: Nephrology

## 2017-11-20 DIAGNOSIS — D631 Anemia in chronic kidney disease: Secondary | ICD-10-CM | POA: Diagnosis not present

## 2017-11-20 DIAGNOSIS — N390 Urinary tract infection, site not specified: Secondary | ICD-10-CM | POA: Insufficient documentation

## 2017-11-20 DIAGNOSIS — N189 Chronic kidney disease, unspecified: Secondary | ICD-10-CM | POA: Insufficient documentation

## 2017-11-20 DIAGNOSIS — Z789 Other specified health status: Secondary | ICD-10-CM | POA: Diagnosis not present

## 2017-11-20 DIAGNOSIS — Z79899 Other long term (current) drug therapy: Secondary | ICD-10-CM | POA: Insufficient documentation

## 2017-11-20 DIAGNOSIS — B269 Mumps without complication: Secondary | ICD-10-CM | POA: Diagnosis not present

## 2017-11-20 DIAGNOSIS — D899 Disorder involving the immune mechanism, unspecified: Secondary | ICD-10-CM | POA: Insufficient documentation

## 2017-11-20 DIAGNOSIS — Z940 Kidney transplant status: Secondary | ICD-10-CM | POA: Diagnosis not present

## 2017-11-20 DIAGNOSIS — E669 Obesity, unspecified: Secondary | ICD-10-CM | POA: Diagnosis not present

## 2017-11-20 DIAGNOSIS — Z9483 Pancreas transplant status: Secondary | ICD-10-CM | POA: Insufficient documentation

## 2017-11-20 DIAGNOSIS — Z09 Encounter for follow-up examination after completed treatment for conditions other than malignant neoplasm: Secondary | ICD-10-CM | POA: Insufficient documentation

## 2017-11-20 DIAGNOSIS — E1129 Type 2 diabetes mellitus with other diabetic kidney complication: Secondary | ICD-10-CM | POA: Insufficient documentation

## 2017-11-20 DIAGNOSIS — Z114 Encounter for screening for human immunodeficiency virus [HIV]: Secondary | ICD-10-CM | POA: Diagnosis not present

## 2017-11-20 LAB — CBC WITH DIFFERENTIAL/PLATELET
BASOS ABS: 0 10*3/uL (ref 0–0.1)
BASOS PCT: 0 %
Eosinophils Absolute: 0.1 10*3/uL (ref 0–0.7)
Eosinophils Relative: 1 %
HCT: 37.7 % — ABNORMAL LOW (ref 40.0–52.0)
HEMOGLOBIN: 12.4 g/dL — AB (ref 13.0–18.0)
LYMPHS PCT: 4 %
Lymphs Abs: 0.2 10*3/uL — ABNORMAL LOW (ref 1.0–3.6)
MCH: 32.5 pg (ref 26.0–34.0)
MCHC: 32.9 g/dL (ref 32.0–36.0)
MCV: 99 fL (ref 80.0–100.0)
MONO ABS: 0.1 10*3/uL — AB (ref 0.2–1.0)
Monocytes Relative: 2 %
Neutro Abs: 4 10*3/uL (ref 1.4–6.5)
Neutrophils Relative %: 93 %
Platelets: 265 10*3/uL (ref 150–440)
RBC: 3.81 MIL/uL — ABNORMAL LOW (ref 4.40–5.90)
RDW: 15.4 % — AB (ref 11.5–14.5)
WBC: 4.3 10*3/uL (ref 3.8–10.6)

## 2017-11-20 LAB — PHOSPHORUS: Phosphorus: 3.4 mg/dL (ref 2.5–4.6)

## 2017-11-20 LAB — BASIC METABOLIC PANEL
Anion gap: 7 (ref 5–15)
BUN: 37 mg/dL — ABNORMAL HIGH (ref 6–20)
CHLORIDE: 107 mmol/L (ref 101–111)
CO2: 23 mmol/L (ref 22–32)
CREATININE: 2.12 mg/dL — AB (ref 0.61–1.24)
Calcium: 9.4 mg/dL (ref 8.9–10.3)
GFR calc non Af Amer: 32 mL/min — ABNORMAL LOW (ref >60)
GFR, EST AFRICAN AMERICAN: 37 mL/min — AB (ref >60)
Glucose, Bld: 126 mg/dL — ABNORMAL HIGH (ref 65–99)
POTASSIUM: 5.5 mmol/L — AB (ref 3.5–5.1)
Sodium: 137 mmol/L (ref 135–145)

## 2017-11-20 LAB — MAGNESIUM: MAGNESIUM: 1.6 mg/dL — AB (ref 1.7–2.4)

## 2017-11-20 MED FILL — MYFORTIC/180MG/TAB: MYFORTIC/180MG/TAB | 30 days supply | Qty: 180 | Fill #2

## 2017-11-20 MED FILL — PROGRAF/1MG/CAP: PROGRAF/1MG/CAP | 30 days supply | Qty: 240 | Fill #1

## 2017-11-21 LAB — TACROLIMUS LEVEL: Tacrolimus (FK506) - LabCorp: 6.9 ng/mL (ref 2.0–20.0)

## 2017-11-22 ENCOUNTER — Other Ambulatory Visit
Admission: RE | Admit: 2017-11-22 | Discharge: 2017-11-22 | Disposition: A | Discharge status: Home / Self Care | Payer: Medicare Other | Source: Ambulatory Visit | Attending: Nephrology | Admitting: Nephrology

## 2017-11-22 DIAGNOSIS — Z789 Other specified health status: Secondary | ICD-10-CM | POA: Diagnosis not present

## 2017-11-22 DIAGNOSIS — Z114 Encounter for screening for human immunodeficiency virus [HIV]: Secondary | ICD-10-CM | POA: Insufficient documentation

## 2017-11-22 DIAGNOSIS — T8610 Unspecified complication of kidney transplant: Secondary | ICD-10-CM | POA: Diagnosis not present

## 2017-11-22 DIAGNOSIS — B259 Cytomegaloviral disease, unspecified: Secondary | ICD-10-CM | POA: Insufficient documentation

## 2017-11-22 DIAGNOSIS — Z9483 Pancreas transplant status: Secondary | ICD-10-CM | POA: Insufficient documentation

## 2017-11-22 DIAGNOSIS — D899 Disorder involving the immune mechanism, unspecified: Secondary | ICD-10-CM | POA: Diagnosis present

## 2017-11-22 DIAGNOSIS — N390 Urinary tract infection, site not specified: Secondary | ICD-10-CM | POA: Insufficient documentation

## 2017-11-22 DIAGNOSIS — E1129 Type 2 diabetes mellitus with other diabetic kidney complication: Secondary | ICD-10-CM | POA: Insufficient documentation

## 2017-11-22 DIAGNOSIS — Z79899 Other long term (current) drug therapy: Secondary | ICD-10-CM | POA: Insufficient documentation

## 2017-11-22 DIAGNOSIS — Z09 Encounter for follow-up examination after completed treatment for conditions other than malignant neoplasm: Secondary | ICD-10-CM | POA: Insufficient documentation

## 2017-11-22 DIAGNOSIS — D631 Anemia in chronic kidney disease: Secondary | ICD-10-CM | POA: Insufficient documentation

## 2017-11-22 DIAGNOSIS — Z940 Kidney transplant status: Secondary | ICD-10-CM | POA: Diagnosis not present

## 2017-11-22 LAB — BASIC METABOLIC PANEL
ANION GAP: 7 (ref 5–15)
BUN: 41 mg/dL — AB (ref 6–20)
CALCIUM: 9.4 mg/dL
CALCIUM: 9.5 mg/dL (ref 8.9–10.3)
CHLORIDE: 107 mmol/L
CO2: 23 mmol/L (ref 22–32)
CREATININE: 2.12 mg/dL — ABNORMAL HIGH
CREATININE: 2.32 mg/dL — AB (ref 0.61–1.24)
Chloride: 105 mmol/L (ref 101–111)
EGFR MDRD NON AF AMER: 32 mL/min/{1.73_m2} — ABNORMAL LOW
GFR calc Af Amer: 33 mL/min — ABNORMAL LOW (ref >60)
GFR, EST NON AFRICAN AMERICAN: 29 mL/min — AB (ref >60)
GLUCOSE RANDOM: 126 mg/dL — ABNORMAL HIGH
Glucose, Bld: 130 mg/dL — ABNORMAL HIGH (ref 65–99)
POTASSIUM: 5.5 mmol/L — ABNORMAL HIGH
Potassium: 5.1 mmol/L (ref 3.5–5.1)
SODIUM: 137 mmol/L
Sodium: 135 mmol/L (ref 135–145)

## 2017-11-22 LAB — CBC WITH DIFFERENTIAL/PLATELET
BASOS PCT: 0 %
Basophils Absolute: 0 10*3/uL (ref 0–0.1)
EOS ABS: 0.1 10*3/uL (ref 0–0.7)
EOS PCT: 1 %
HCT: 37.8 % — ABNORMAL LOW (ref 40.0–52.0)
Hemoglobin: 12.5 g/dL — ABNORMAL LOW (ref 13.0–18.0)
Lymphocytes Relative: 4 %
Lymphs Abs: 0.2 10*3/uL — ABNORMAL LOW (ref 1.0–3.6)
MCH: 32.3 pg (ref 26.0–34.0)
MCHC: 33 g/dL (ref 32.0–36.0)
MCV: 97.7 fL (ref 80.0–100.0)
MONO ABS: 0.2 10*3/uL (ref 0.2–1.0)
MONOS PCT: 4 %
NEUTROS PCT: 91 %
Neutro Abs: 4.8 10*3/uL (ref 1.4–6.5)
Platelets: 266 10*3/uL (ref 150–440)
RBC: 3.87 MIL/uL — ABNORMAL LOW (ref 4.40–5.90)
RDW: 15.4 % — ABNORMAL HIGH (ref 11.5–14.5)
WBC: 5.3 10*3/uL (ref 3.8–10.6)

## 2017-11-22 LAB — MAGNESIUM
Lab: 1.6 — ABNORMAL LOW
Magnesium: 1.7 mg/dL (ref 1.7–2.4)

## 2017-11-22 LAB — PHOSPHORUS
Lab: 3.4
PHOSPHORUS: 3.1 mg/dL (ref 2.5–4.6)

## 2017-11-22 LAB — CBC W/ DIFFERENTIAL
BASOPHILS ABSOLUTE COUNT: 0 10*9/L
BASOPHILS RELATIVE PERCENT: 0 %
EOSINOPHILS ABSOLUTE COUNT: 0.1 10*9/L
EOSINOPHILS RELATIVE PERCENT: 1 %
HEMATOCRIT: 37.7 % — ABNORMAL LOW
HEMOGLOBIN: 12.4 g/dL — ABNORMAL LOW
LYMPHOCYTES ABSOLUTE COUNT: 0.2 10*9/L — ABNORMAL LOW
LYMPHOCYTES RELATIVE PERCENT: 4 %
MEAN CORPUSCULAR HEMOGLOBIN CONC: 32.9 g/dL
MEAN CORPUSCULAR HEMOGLOBIN: 32.5 pg
MEAN CORPUSCULAR VOLUME: 99 fL
MONOCYTES ABSOLUTE COUNT: 0.1 10*9/L — ABNORMAL LOW
MONOCYTES RELATIVE PERCENT: 2 %
NEUTROPHILS ABSOLUTE COUNT: 4 10*9/L
NEUTROPHILS RELATIVE PERCENT: 93 %
PLATELET COUNT: 265 10*9/L
RED BLOOD CELL COUNT: 3.81 10*12/L — ABNORMAL LOW
RED CELL DISTRIBUTION WIDTH: 15.4 % — ABNORMAL HIGH
WHITE BLOOD CELL COUNT: 4.3 10*9/L

## 2017-11-22 LAB — NUCLEATED RED BLOOD CELLS: Lab: 0

## 2017-11-22 LAB — GLUCOSE RANDOM: Lab: 126 — ABNORMAL HIGH

## 2017-11-23 LAB — TACROLIMUS LEVEL: TACROLIMUS (FK506) - LABCORP: 7.5 ng/mL (ref 2.0–20.0)

## 2017-11-25 ENCOUNTER — Other Ambulatory Visit
Admission: RE | Admit: 2017-11-25 | Discharge: 2017-11-25 | Disposition: A | Discharge status: Home / Self Care | Payer: Medicare Other | Source: Ambulatory Visit | Attending: Nephrology | Admitting: Nephrology

## 2017-11-25 DIAGNOSIS — X58XXXA Exposure to other specified factors, initial encounter: Secondary | ICD-10-CM | POA: Diagnosis not present

## 2017-11-25 DIAGNOSIS — Z79899 Other long term (current) drug therapy: Secondary | ICD-10-CM | POA: Diagnosis not present

## 2017-11-25 DIAGNOSIS — Z789 Other specified health status: Secondary | ICD-10-CM | POA: Insufficient documentation

## 2017-11-25 DIAGNOSIS — D899 Disorder involving the immune mechanism, unspecified: Secondary | ICD-10-CM | POA: Insufficient documentation

## 2017-11-25 DIAGNOSIS — Z9483 Pancreas transplant status: Secondary | ICD-10-CM | POA: Insufficient documentation

## 2017-11-25 DIAGNOSIS — B259 Cytomegaloviral disease, unspecified: Secondary | ICD-10-CM | POA: Insufficient documentation

## 2017-11-25 DIAGNOSIS — T8610 Unspecified complication of kidney transplant: Secondary | ICD-10-CM | POA: Insufficient documentation

## 2017-11-25 DIAGNOSIS — D631 Anemia in chronic kidney disease: Secondary | ICD-10-CM | POA: Insufficient documentation

## 2017-11-25 DIAGNOSIS — E559 Vitamin D deficiency, unspecified: Secondary | ICD-10-CM | POA: Diagnosis not present

## 2017-11-25 DIAGNOSIS — E1129 Type 2 diabetes mellitus with other diabetic kidney complication: Secondary | ICD-10-CM | POA: Insufficient documentation

## 2017-11-25 DIAGNOSIS — Z114 Encounter for screening for human immunodeficiency virus [HIV]: Secondary | ICD-10-CM | POA: Diagnosis not present

## 2017-11-25 DIAGNOSIS — Z940 Kidney transplant status: Secondary | ICD-10-CM | POA: Diagnosis not present

## 2017-11-25 DIAGNOSIS — N390 Urinary tract infection, site not specified: Secondary | ICD-10-CM | POA: Insufficient documentation

## 2017-11-25 LAB — BASIC METABOLIC PANEL
ANION GAP: 7 (ref 5–15)
BUN: 31 mg/dL — AB (ref 6–20)
CALCIUM: 9.4 mg/dL (ref 8.9–10.3)
CO2: 24 mmol/L (ref 22–32)
CREATININE: 2.31 mg/dL — AB (ref 0.61–1.24)
Chloride: 104 mmol/L (ref 101–111)
GFR calc Af Amer: 33 mL/min — ABNORMAL LOW (ref >60)
GFR, EST NON AFRICAN AMERICAN: 29 mL/min — AB (ref >60)
GLUCOSE: 122 mg/dL — AB (ref 65–99)
Potassium: 4.8 mmol/L (ref 3.5–5.1)
Sodium: 135 mmol/L (ref 135–145)

## 2017-11-25 LAB — CBC WITH DIFFERENTIAL/PLATELET
Basophils Absolute: 0 10*3/uL (ref 0–0.1)
Basophils Relative: 0 %
EOS PCT: 1 %
Eosinophils Absolute: 0 10*3/uL (ref 0–0.7)
HCT: 38.9 % — ABNORMAL LOW (ref 40.0–52.0)
Hemoglobin: 12.8 g/dL — ABNORMAL LOW (ref 13.0–18.0)
LYMPHS ABS: 0.2 10*3/uL — AB (ref 1.0–3.6)
Lymphocytes Relative: 5 %
MCH: 32 pg (ref 26.0–34.0)
MCHC: 32.8 g/dL (ref 32.0–36.0)
MCV: 97.6 fL (ref 80.0–100.0)
MONO ABS: 0.1 10*3/uL — AB (ref 0.2–1.0)
MONOS PCT: 2 %
Neutro Abs: 4.2 10*3/uL (ref 1.4–6.5)
Neutrophils Relative %: 92 %
PLATELETS: 247 10*3/uL (ref 150–440)
RBC: 3.98 MIL/uL — ABNORMAL LOW (ref 4.40–5.90)
RDW: 15.8 % — ABNORMAL HIGH (ref 11.5–14.5)
WBC: 4.6 10*3/uL (ref 3.8–10.6)

## 2017-11-25 LAB — PHOSPHORUS: Phosphorus: 3.1 mg/dL (ref 2.5–4.6)

## 2017-11-25 LAB — MAGNESIUM: Magnesium: 1.7 mg/dL (ref 1.7–2.4)

## 2017-11-25 MED ORDER — FAMOTIDINE 20 MG TABLET
Freq: Every day | ORAL | 3 refills | 0.00000 days | Status: CP
Start: 2017-11-25 — End: 2017-11-25

## 2017-11-25 MED ORDER — FAMOTIDINE 20 MG TABLET: each | 3 refills | 0 days

## 2017-11-26 LAB — TACROLIMUS LEVEL: TACROLIMUS (FK506) - LABCORP: 9.8 ng/mL (ref 2.0–20.0)

## 2017-11-26 LAB — CBC W/ DIFFERENTIAL
BASOPHILS ABSOLUTE COUNT: 0 10*9/L
BASOPHILS RELATIVE PERCENT: 0 %
EOSINOPHILS ABSOLUTE COUNT: 0.1 10*9/L
HEMATOCRIT: 37.8 % — ABNORMAL LOW
HEMOGLOBIN: 12.5 g/dL — ABNORMAL LOW
LYMPHOCYTES ABSOLUTE COUNT: 0.2 10*9/L — ABNORMAL LOW
LYMPHOCYTES RELATIVE PERCENT: 4 %
MEAN CORPUSCULAR HEMOGLOBIN: 32.3 pg
MEAN CORPUSCULAR VOLUME: 97.7 fL
MONOCYTES ABSOLUTE COUNT: 0.2 10*9/L
MONOCYTES RELATIVE PERCENT: 4 %
NEUTROPHILS ABSOLUTE COUNT: 4.8 10*9/L
NEUTROPHILS RELATIVE PERCENT: 91 %
PLATELET COUNT: 266 10*9/L
RED BLOOD CELL COUNT: 3.87 10*12/L — ABNORMAL LOW
RED CELL DISTRIBUTION WIDTH: 15.4 % — ABNORMAL HIGH
WBC ADJUSTED: 5.3 10*9/L

## 2017-11-26 LAB — SODIUM: Lab: 135

## 2017-11-26 LAB — HYPOCHROMIA: Lab: 0

## 2017-11-26 LAB — BASIC METABOLIC PANEL
BLOOD UREA NITROGEN: 41 mg/dL — ABNORMAL HIGH
CALCIUM: 9.5 mg/dL
CO2: 23 mmol/L
CREATININE: 2.32 mg/dL — ABNORMAL HIGH
GLUCOSE RANDOM: 130 mg/dL — ABNORMAL HIGH
POTASSIUM: 5.1 mmol/L
SODIUM: 135 mmol/L

## 2017-11-26 LAB — PHOSPHORUS: Lab: 3.1

## 2017-11-26 LAB — MAGNESIUM: Lab: 1.7

## 2017-11-27 ENCOUNTER — Other Ambulatory Visit
Admission: RE | Admit: 2017-11-27 | Discharge: 2017-11-27 | Disposition: A | Discharge status: Home / Self Care | Payer: Medicare Other | Source: Ambulatory Visit | Attending: Nephrology | Admitting: Nephrology

## 2017-11-27 DIAGNOSIS — Z9483 Pancreas transplant status: Secondary | ICD-10-CM | POA: Insufficient documentation

## 2017-11-27 DIAGNOSIS — E1122 Type 2 diabetes mellitus with diabetic chronic kidney disease: Secondary | ICD-10-CM | POA: Diagnosis not present

## 2017-11-27 DIAGNOSIS — Z940 Kidney transplant status: Secondary | ICD-10-CM | POA: Insufficient documentation

## 2017-11-27 DIAGNOSIS — D631 Anemia in chronic kidney disease: Secondary | ICD-10-CM | POA: Insufficient documentation

## 2017-11-27 DIAGNOSIS — Z789 Other specified health status: Secondary | ICD-10-CM | POA: Diagnosis not present

## 2017-11-27 DIAGNOSIS — N189 Chronic kidney disease, unspecified: Secondary | ICD-10-CM | POA: Diagnosis not present

## 2017-11-27 DIAGNOSIS — E559 Vitamin D deficiency, unspecified: Secondary | ICD-10-CM | POA: Diagnosis not present

## 2017-11-27 DIAGNOSIS — Z79899 Other long term (current) drug therapy: Secondary | ICD-10-CM | POA: Insufficient documentation

## 2017-11-27 DIAGNOSIS — D899 Disorder involving the immune mechanism, unspecified: Secondary | ICD-10-CM | POA: Diagnosis present

## 2017-11-27 DIAGNOSIS — N390 Urinary tract infection, site not specified: Secondary | ICD-10-CM | POA: Diagnosis not present

## 2017-11-27 DIAGNOSIS — B259 Cytomegaloviral disease, unspecified: Secondary | ICD-10-CM | POA: Diagnosis not present

## 2017-11-27 DIAGNOSIS — E1129 Type 2 diabetes mellitus with other diabetic kidney complication: Secondary | ICD-10-CM | POA: Insufficient documentation

## 2017-11-27 DIAGNOSIS — Z114 Encounter for screening for human immunodeficiency virus [HIV]: Secondary | ICD-10-CM | POA: Diagnosis not present

## 2017-11-27 LAB — MAGNESIUM
Lab: 1.7
MAGNESIUM: 1.8 mg/dL (ref 1.7–2.4)

## 2017-11-27 LAB — BASIC METABOLIC PANEL
ANION GAP: 5 (ref 5–15)
BUN: 37 mg/dL — ABNORMAL HIGH (ref 6–20)
CALCIUM: 9.5 mg/dL (ref 8.9–10.3)
CHLORIDE: 107 mmol/L (ref 101–111)
CO2: 25 mmol/L (ref 22–32)
CREATININE: 2.3 mg/dL — AB (ref 0.61–1.24)
GFR calc non Af Amer: 29 mL/min — ABNORMAL LOW (ref >60)
GFR, EST AFRICAN AMERICAN: 34 mL/min — AB (ref >60)
GLUCOSE: 123 mg/dL — AB (ref 65–99)
Potassium: 5 mmol/L (ref 3.5–5.1)
Sodium: 137 mmol/L (ref 135–145)

## 2017-11-27 LAB — CBC WITH DIFFERENTIAL/PLATELET
BASOS ABS: 0 10*3/uL (ref 0–0.1)
Basophils Relative: 0 %
EOS PCT: 1 %
Eosinophils Absolute: 0.1 10*3/uL (ref 0–0.7)
HEMATOCRIT: 38.1 % — AB (ref 40.0–52.0)
Hemoglobin: 12.6 g/dL — ABNORMAL LOW (ref 13.0–18.0)
LYMPHS ABS: 0.3 10*3/uL — AB (ref 1.0–3.6)
Lymphocytes Relative: 6 %
MCH: 32.3 pg (ref 26.0–34.0)
MCHC: 33.1 g/dL (ref 32.0–36.0)
MCV: 97.6 fL (ref 80.0–100.0)
Monocytes Absolute: 0.2 10*3/uL (ref 0.2–1.0)
Monocytes Relative: 4 %
NEUTROS ABS: 4 10*3/uL (ref 1.4–6.5)
Neutrophils Relative %: 89 %
Platelets: 225 10*3/uL (ref 150–440)
RBC: 3.91 MIL/uL — AB (ref 4.40–5.90)
RDW: 15.3 % — ABNORMAL HIGH (ref 11.5–14.5)
WBC: 4.5 10*3/uL (ref 3.8–10.6)

## 2017-11-27 LAB — PHOSPHORUS
Lab: 3.1
Phosphorus: 3.2 mg/dL (ref 2.5–4.6)

## 2017-11-27 LAB — COMPREHENSIVE METABOLIC PANEL
BLOOD UREA NITROGEN: 122 mg/dL — ABNORMAL HIGH
CALCIUM: 9.4 mg/dL
CO2: 24 mmol/L
CREATININE: 2.31 mg/dL — ABNORMAL HIGH
EGFR MDRD AF AMER: 33 mL/min/{1.73_m2} — ABNORMAL LOW
GLUCOSE RANDOM: 122 mg/dL — ABNORMAL HIGH
POTASSIUM: 4.8 mmol/L
SODIUM: 135 mmol/L

## 2017-11-27 LAB — CBC W/ DIFFERENTIAL
BASOPHILS ABSOLUTE COUNT: 0 10*9/L
BASOPHILS RELATIVE PERCENT: 0 %
EOSINOPHILS ABSOLUTE COUNT: 0 10*9/L
EOSINOPHILS RELATIVE PERCENT: 1 %
HEMATOCRIT: 38.9 % — ABNORMAL LOW
HEMOGLOBIN: 12.8 g/dL — ABNORMAL LOW
LYMPHOCYTES ABSOLUTE COUNT: 0.2 10*9/L — ABNORMAL LOW
LYMPHOCYTES RELATIVE PERCENT: 5 %
MEAN CORPUSCULAR HEMOGLOBIN CONC: 32.8 g/dL
MEAN CORPUSCULAR VOLUME: 97.6 fL
MONOCYTES ABSOLUTE COUNT: 0.1 10*9/L — ABNORMAL LOW
MONOCYTES RELATIVE PERCENT: 2 %
NEUTROPHILS ABSOLUTE COUNT: 4.2 10*9/L
PLATELET COUNT: 247 10*9/L
RED BLOOD CELL COUNT: 3.98 10*12/L — ABNORMAL LOW
WHITE BLOOD CELL COUNT: 4.6 10*9/L

## 2017-11-27 LAB — TACROLIMUS, TROUGH: Lab: 7.5

## 2017-11-27 LAB — CREATININE: Lab: 2.31 — ABNORMAL HIGH

## 2017-11-27 LAB — BASOPHILS RELATIVE PERCENT: Lab: 0

## 2017-11-27 MED FILL — FAMOTIDINE/20MG/TABS: FAMOTIDINE/20MG/TABS | 90 days supply | Qty: 90 | Fill #0

## 2017-11-29 ENCOUNTER — Other Ambulatory Visit
Admission: RE | Admit: 2017-11-29 | Discharge: 2017-11-29 | Disposition: A | Discharge status: Home / Self Care | Payer: Medicare Other | Source: Ambulatory Visit | Attending: Nephrology | Admitting: Nephrology

## 2017-11-29 DIAGNOSIS — B259 Cytomegaloviral disease, unspecified: Secondary | ICD-10-CM | POA: Insufficient documentation

## 2017-11-29 DIAGNOSIS — Z114 Encounter for screening for human immunodeficiency virus [HIV]: Secondary | ICD-10-CM | POA: Diagnosis not present

## 2017-11-29 DIAGNOSIS — D631 Anemia in chronic kidney disease: Secondary | ICD-10-CM | POA: Diagnosis not present

## 2017-11-29 DIAGNOSIS — E1129 Type 2 diabetes mellitus with other diabetic kidney complication: Secondary | ICD-10-CM | POA: Diagnosis not present

## 2017-11-29 DIAGNOSIS — Z789 Other specified health status: Secondary | ICD-10-CM | POA: Insufficient documentation

## 2017-11-29 DIAGNOSIS — D899 Disorder involving the immune mechanism, unspecified: Secondary | ICD-10-CM | POA: Insufficient documentation

## 2017-11-29 DIAGNOSIS — Z09 Encounter for follow-up examination after completed treatment for conditions other than malignant neoplasm: Secondary | ICD-10-CM | POA: Insufficient documentation

## 2017-11-29 DIAGNOSIS — Z79899 Other long term (current) drug therapy: Secondary | ICD-10-CM | POA: Diagnosis present

## 2017-11-29 DIAGNOSIS — E559 Vitamin D deficiency, unspecified: Secondary | ICD-10-CM | POA: Insufficient documentation

## 2017-11-29 DIAGNOSIS — N390 Urinary tract infection, site not specified: Secondary | ICD-10-CM | POA: Diagnosis not present

## 2017-11-29 DIAGNOSIS — Z940 Kidney transplant status: Secondary | ICD-10-CM | POA: Insufficient documentation

## 2017-11-29 LAB — CBC WITH DIFFERENTIAL/PLATELET
Basophils Absolute: 0 10*3/uL (ref 0–0.1)
Basophils Relative: 0 %
EOS ABS: 0 10*3/uL (ref 0–0.7)
EOS PCT: 1 %
HCT: 39.3 % — ABNORMAL LOW (ref 40.0–52.0)
HEMOGLOBIN: 12.8 g/dL — AB (ref 13.0–18.0)
LYMPHS ABS: 0.2 10*3/uL — AB (ref 1.0–3.6)
Lymphocytes Relative: 6 %
MCH: 31.9 pg (ref 26.0–34.0)
MCHC: 32.7 g/dL (ref 32.0–36.0)
MCV: 97.7 fL (ref 80.0–100.0)
MONOS PCT: 4 %
Monocytes Absolute: 0.2 10*3/uL (ref 0.2–1.0)
NEUTROS PCT: 89 %
Neutro Abs: 3.8 10*3/uL (ref 1.4–6.5)
PLATELETS: 228 10*3/uL (ref 150–440)
RBC: 4.02 MIL/uL — ABNORMAL LOW (ref 4.40–5.90)
RDW: 15.4 % — AB (ref 11.5–14.5)
WBC: 4.2 10*3/uL (ref 3.8–10.6)

## 2017-11-29 LAB — BASIC METABOLIC PANEL
ANION GAP: 4 — AB (ref 5–15)
BUN: 31 mg/dL — AB (ref 6–20)
CALCIUM: 9.6 mg/dL (ref 8.9–10.3)
CO2: 27 mmol/L (ref 22–32)
Chloride: 105 mmol/L (ref 101–111)
Creatinine, Ser: 2.08 mg/dL — ABNORMAL HIGH (ref 0.61–1.24)
GFR calc Af Amer: 38 mL/min — ABNORMAL LOW (ref >60)
GFR, EST NON AFRICAN AMERICAN: 33 mL/min — AB (ref >60)
GLUCOSE: 114 mg/dL — AB (ref 65–99)
POTASSIUM: 5.4 mmol/L — AB (ref 3.5–5.1)
Sodium: 136 mmol/L (ref 135–145)

## 2017-11-29 LAB — MAGNESIUM: Magnesium: 1.6 mg/dL — ABNORMAL LOW (ref 1.7–2.4)

## 2017-11-29 LAB — PHOSPHORUS: Phosphorus: 2.3 mg/dL — ABNORMAL LOW (ref 2.5–4.6)

## 2017-11-29 LAB — TACROLIMUS LEVEL: TACROLIMUS (FK506) - LABCORP: 9.6 ng/mL (ref 2.0–20.0)

## 2017-12-02 ENCOUNTER — Other Ambulatory Visit
Admission: RE | Admit: 2017-12-02 | Discharge: 2017-12-02 | Disposition: A | Discharge status: Home / Self Care | Payer: Medicare Other | Source: Ambulatory Visit | Attending: Nephrology | Admitting: Nephrology

## 2017-12-02 DIAGNOSIS — Z789 Other specified health status: Secondary | ICD-10-CM | POA: Insufficient documentation

## 2017-12-02 DIAGNOSIS — E559 Vitamin D deficiency, unspecified: Secondary | ICD-10-CM | POA: Insufficient documentation

## 2017-12-02 DIAGNOSIS — Z114 Encounter for screening for human immunodeficiency virus [HIV]: Secondary | ICD-10-CM | POA: Diagnosis not present

## 2017-12-02 DIAGNOSIS — Z09 Encounter for follow-up examination after completed treatment for conditions other than malignant neoplasm: Secondary | ICD-10-CM | POA: Insufficient documentation

## 2017-12-02 DIAGNOSIS — B259 Cytomegaloviral disease, unspecified: Secondary | ICD-10-CM | POA: Insufficient documentation

## 2017-12-02 DIAGNOSIS — D899 Disorder involving the immune mechanism, unspecified: Secondary | ICD-10-CM | POA: Diagnosis present

## 2017-12-02 DIAGNOSIS — Z940 Kidney transplant status: Secondary | ICD-10-CM | POA: Diagnosis not present

## 2017-12-02 DIAGNOSIS — Z79899 Other long term (current) drug therapy: Secondary | ICD-10-CM | POA: Insufficient documentation

## 2017-12-02 DIAGNOSIS — E1129 Type 2 diabetes mellitus with other diabetic kidney complication: Secondary | ICD-10-CM | POA: Insufficient documentation

## 2017-12-02 DIAGNOSIS — N189 Chronic kidney disease, unspecified: Secondary | ICD-10-CM | POA: Insufficient documentation

## 2017-12-02 DIAGNOSIS — Z9483 Pancreas transplant status: Secondary | ICD-10-CM | POA: Insufficient documentation

## 2017-12-02 DIAGNOSIS — D631 Anemia in chronic kidney disease: Secondary | ICD-10-CM | POA: Insufficient documentation

## 2017-12-02 LAB — BASIC METABOLIC PANEL
Anion gap: 6 (ref 5–15)
BUN: 31 mg/dL — AB (ref 6–20)
CHLORIDE: 105 mmol/L (ref 101–111)
CO2: 26 mmol/L (ref 22–32)
CREATININE: 2.38 mg/dL — AB (ref 0.61–1.24)
Calcium: 9.6 mg/dL (ref 8.9–10.3)
GFR calc Af Amer: 32 mL/min — ABNORMAL LOW (ref >60)
GFR calc non Af Amer: 28 mL/min — ABNORMAL LOW (ref >60)
GLUCOSE: 120 mg/dL — AB (ref 65–99)
Potassium: 5.1 mmol/L (ref 3.5–5.1)
Sodium: 137 mmol/L (ref 135–145)

## 2017-12-02 LAB — CBC WITH DIFFERENTIAL/PLATELET
BASOS ABS: 0 10*3/uL (ref 0–0.1)
Basophils Relative: 0 %
Eosinophils Absolute: 0 10*3/uL (ref 0–0.7)
Eosinophils Relative: 1 %
HCT: 40.3 % (ref 40.0–52.0)
HEMOGLOBIN: 13.3 g/dL (ref 13.0–18.0)
LYMPHS PCT: 7 %
Lymphs Abs: 0.3 10*3/uL — ABNORMAL LOW (ref 1.0–3.6)
MCH: 32.3 pg (ref 26.0–34.0)
MCHC: 33.2 g/dL (ref 32.0–36.0)
MCV: 97.5 fL (ref 80.0–100.0)
MONO ABS: 0.2 10*3/uL (ref 0.2–1.0)
MONOS PCT: 4 %
NEUTROS ABS: 3.5 10*3/uL (ref 1.4–6.5)
NEUTROS PCT: 88 %
Platelets: 198 10*3/uL (ref 150–440)
RBC: 4.13 MIL/uL — ABNORMAL LOW (ref 4.40–5.90)
RDW: 15.4 % — ABNORMAL HIGH (ref 11.5–14.5)
WBC: 4 10*3/uL (ref 3.8–10.6)

## 2017-12-02 LAB — TACROLIMUS LEVEL: Tacrolimus (FK506) - LabCorp: 13.8 ng/mL (ref 2.0–20.0)

## 2017-12-02 LAB — MAGNESIUM: Magnesium: 1.5 mg/dL — ABNORMAL LOW (ref 1.7–2.4)

## 2017-12-02 LAB — PHOSPHORUS: Phosphorus: 3.2 mg/dL (ref 2.5–4.6)

## 2017-12-04 ENCOUNTER — Ambulatory Visit: Admit: 2017-12-04 | Discharge: 2017-12-05 | Payer: MEDICARE

## 2017-12-04 ENCOUNTER — Ambulatory Visit
Admit: 2017-12-04 | Discharge: 2017-12-05 | Payer: MEDICARE | Attending: Pharmacist Clinician (PhC)/ Clinical Pharmacy Specialist | Primary: Pharmacist Clinician (PhC)/ Clinical Pharmacy Specialist

## 2017-12-04 ENCOUNTER — Ambulatory Visit: Admit: 2017-12-04 | Discharge: 2017-12-05 | Payer: MEDICARE | Attending: Nephrology | Primary: Nephrology

## 2017-12-04 DIAGNOSIS — Z79899 Other long term (current) drug therapy: Secondary | ICD-10-CM

## 2017-12-04 DIAGNOSIS — Z940 Kidney transplant status: Principal | ICD-10-CM

## 2017-12-04 DIAGNOSIS — D899 Disorder involving the immune mechanism, unspecified: Secondary | ICD-10-CM

## 2017-12-04 LAB — TACROLIMUS LEVEL: Tacrolimus (FK506) - LabCorp: 12 ng/mL (ref 2.0–20.0)

## 2017-12-04 LAB — COMPREHENSIVE METABOLIC PANEL
ALBUMIN: 5 g/dL (ref 3.5–5.0)
ALKALINE PHOSPHATASE: 85 U/L (ref 38–126)
ALT (SGPT): 34 U/L (ref 19–72)
ANION GAP: 9 mmol/L (ref 9–15)
AST (SGOT): 34 U/L (ref 19–55)
BILIRUBIN TOTAL: 0.4 mg/dL (ref 0.0–1.2)
BLOOD UREA NITROGEN: 31 mg/dL — ABNORMAL HIGH (ref 7–21)
BUN / CREAT RATIO: 13
CALCIUM: 10.6 mg/dL — ABNORMAL HIGH (ref 8.5–10.2)
CO2: 27 mmol/L (ref 22.0–30.0)
CREATININE: 2.36 mg/dL — ABNORMAL HIGH (ref 0.70–1.30)
EGFR MDRD AF AMER: 34 mL/min/{1.73_m2} — ABNORMAL LOW (ref >=60)
EGFR MDRD NON AF AMER: 28 mL/min/{1.73_m2} — ABNORMAL LOW (ref >=60)
GLUCOSE RANDOM: 113 mg/dL — ABNORMAL HIGH (ref 65–99)
POTASSIUM: 5.7 mmol/L — ABNORMAL HIGH (ref 3.5–5.0)
PROTEIN TOTAL: 7.8 g/dL (ref 6.5–8.3)

## 2017-12-04 LAB — URINALYSIS
BILIRUBIN UA: NEGATIVE
GLUCOSE UA: NEGATIVE
KETONES UA: NEGATIVE
LEUKOCYTE ESTERASE UA: NEGATIVE
NITRITE UA: NEGATIVE
PH UA: 5 (ref 5.0–9.0)
PROTEIN UA: 30 — AB
RBC UA: 2 /HPF (ref <=3)
SPECIFIC GRAVITY UA: 1.011 (ref 1.003–1.030)
SQUAMOUS EPITHELIAL: <1 /HPF (ref 0–5)
UROBILINOGEN UA: 0.2
WBC UA: <1 /HPF (ref <=2)

## 2017-12-04 LAB — MAGNESIUM: Magnesium:MCnc:Pt:Ser/Plas:Qn:: 1.6

## 2017-12-04 LAB — CBC W/ AUTO DIFF
BASOPHILS ABSOLUTE COUNT: 0 10*9/L (ref 0.0–0.1)
BASOPHILS RELATIVE PERCENT: 0.7 %
EOSINOPHILS ABSOLUTE COUNT: 0.1 10*9/L (ref 0.0–0.4)
EOSINOPHILS RELATIVE PERCENT: 1 %
HEMATOCRIT: 43.1 % (ref 41.0–53.0)
HEMOGLOBIN: 13.2 g/dL — ABNORMAL LOW (ref 13.5–17.5)
LARGE UNSTAINED CELLS: 1 % (ref 0–4)
LYMPHOCYTES ABSOLUTE COUNT: 0.4 10*9/L — ABNORMAL LOW (ref 1.5–5.0)
MEAN CORPUSCULAR HEMOGLOBIN CONC: 30.7 g/dL — ABNORMAL LOW (ref 31.0–37.0)
MEAN CORPUSCULAR HEMOGLOBIN: 31.1 pg (ref 26.0–34.0)
MEAN CORPUSCULAR VOLUME: 101.6 fL — ABNORMAL HIGH (ref 80.0–100.0)
MEAN PLATELET VOLUME: 9 fL (ref 7.0–10.0)
MONOCYTES ABSOLUTE COUNT: 0.2 10*9/L (ref 0.2–0.8)
MONOCYTES RELATIVE PERCENT: 3.4 %
NEUTROPHILS ABSOLUTE COUNT: 4.3 10*9/L (ref 2.0–7.5)
NEUTROPHILS RELATIVE PERCENT: 85.5 %
PLATELET COUNT: 223 10*9/L (ref 150–440)
RED BLOOD CELL COUNT: 4.24 10*12/L — ABNORMAL LOW (ref 4.50–5.90)
RED CELL DISTRIBUTION WIDTH: 14.9 % (ref 12.0–15.0)

## 2017-12-04 LAB — PROTEIN / CREATININE RATIO, URINE
PROTEIN URINE: 84.5 mg/dL
PROTEIN/CREAT RATIO, URINE: 0.879

## 2017-12-04 LAB — WBC ADJUSTED: Lab: 5

## 2017-12-04 LAB — TACROLIMUS, TROUGH: Lab: 8.5

## 2017-12-04 LAB — PHOSPHORUS: Phosphate:MCnc:Pt:Ser/Plas:Qn:: 3.6

## 2017-12-04 LAB — PROTEIN URINE: Protein:MCnc:Pt:Urine:Qn:: 84.5

## 2017-12-04 LAB — CO2: Carbon dioxide:SCnc:Pt:Ser/Plas:Qn:: 27

## 2017-12-04 LAB — SMEAR REVIEW

## 2017-12-04 LAB — MUCUS

## 2017-12-04 MED ORDER — PROGRAF 1 MG CAPSULE
ORAL_CAPSULE | 11 refills | 0 days
Start: 2017-12-04 — End: 2018-02-12

## 2017-12-04 NOTE — Unmapped (Signed)
BACTRIM - WAS WITHOUT MEDICATION FOR 15 DAYS PER PT  Sending out for delivery on 5/29  Letting coordinator now he will be without medication for 17 days total    Patient was ok with delivery date of Friday    Everlean Cherry CPHT

## 2017-12-04 NOTE — Unmapped (Signed)
[facility] transplant nephrology clinic visit    assessment and plan  1. s/p kidney transplant 09/03/2017. baseline creatinine 2-2.5 mg/dl. +mild proteinuria. no donor specific hla ab detected. tacrolimus decr to 4mg /3mg  q.am/pm re: 12hr lvl 6-8 ng/dl.   2. hypertension. blood pressure goal <130/80 mmhg.  3. preventive medicine. influenza '18. ppsv23 pneumococcal '12. valganciclovir 450mg  3x/wk 2-5/19 now completed. trimethoprim/sulfamethoxazole 80/400mg  3x/wk x55m 2-8/19.     history of present illness    Justin Mcknight is a 62 year old gentleman seen in follow up s/p kidney transplant 09/03/2017. post transplant course notable for +mild focal thrombotic microangiopathy seen on zero hour kidney bx and on follow up x2wks post transplant. +slow improving creatinine lvl.     [name] is seen with assistance of [place] Engineer, structural. he feels well. no fevers chills or sweats. +occ transient low blood pressure readings with lightheadedness. no chest pain palpitations or shortness of breath. no lower ext edema. appetite nl. no n/v/d. no dysuria hematuria or difficulty voiding. no tremor weakness or paresthesias. all other systems reviewed and negative x10 systems.    home bps have been 110s/80s mmhg.    past medical hx:  1. 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 hypertension. alemtuzumab induction. baseline creatinine 2-2.5 mg/dl.  > kidney bx 03/19: no acute rejection. +mild focal thrombotic microangiopathy.  > kidney bx 05/19: no acute rejection. no thrombotic microangiopathy.  2. hypertension  3. hx renal cell carcinoma s/p right nephrectomy '12.    past surgical hx: left forearm av fistula '12. right nephrectomy '12. kidney transplant '19.    allergies: codeine    medications: tacrolimus 4mg  bid, mycophenolate sodium 540mg  bid, amlodipine 5mg  daily, valganciclovir 450mg  3x/wk - completed, trimethoprim/sulfamethoxazole 80/400mg  3x/wk, famotidine 20 mg daily. 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    physical exam: t96.7 p88 bp130/78 wt79kg bmi 22.4. wd/wn gentleman nl/appropriate affect and mood. nl sclera anicteric. mmm no thrush. neck supple no palpable ln. heart rrr nl s1s2 no m/r/g. lungs clear bilateral. abd soft nt/nd. no lower ext edema. msk no synovitis/tophi. skin no rash. neuro alert oriented non focal exam.    labs 12/04/17: wbc5.0 hgb13.2 hct43.1 plts223. na135 k5.0 cl103 bicarb23 bun31 cr2.3 glc130 ca9.6 mg1.8 phos2.9. tacrolimus lvl 8.5 ng/ml. urine protein/cr 0.553.    scribe's attestation: Sakara Lehtinen, md obtained and performed the history, physical exam and medical decision making elements that were entered into the chart. signed by [place] ormond foster, scribe, on Dec 04, 2017 9:54 am.    ----------------------------------------------------------------------------------------------------------------------  Dec 04, 2017 6:42 PM. documentation assistance provided by the scribe. i was present during the time the encounter was recorded. the information recorded by the scribe was done at my direction and has been reviewed and validated by me.  ----------------------------------------------------------------------------------------------------------------------

## 2017-12-04 NOTE — Unmapped (Signed)
[facility] PHARMACY NOTE  12/04/2017   [place] Justin Mcknight  [id number]    Medication changes today:   1. Decrease tacrolimus to 4 mg qAM and 3 mg qPM    Education/Adherence tools provided today:  1.provided updated medication list  2. provided additional pill box education  3.  Facilitated medication access for Bactrim  4.  provided additional education on immunosuppression and transplant related medications including reviewing indications of medications, dosing and side effects  5. provided additional education on importance of increased water intake and not to drink water at night    Follow up items:  1. goal of understanding indications and dosing of immunosuppression medications  2. Start Vitamin D 1,000 unit daily at next visit   3. Pill box for accuracy    Next visit with pharmacy in 1-3 months  ____________________________________________________________________    Justin Mcknight is a 62 y.o. 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     Other PMH significant for hypertension     Readmitted 3/7-09/13/17 post renal biopsy 2/2 acute hemoglobin drop and consider for bleeding.  Renal u/s showed perinephric hematomas.     Seen by pharmacy today for: medication management and pill box fill and adherence education; last seen by pharmacy first visit.    CC:  Patient complains of fatigue - attributes to not sleeping well 2/2 urinary frequency at night    There were no vitals filed for this visit.    Allergies   Allergen Reactions   ??? Codeine Swelling       All medications reviewed and updated.     Medication list includes revisions made during today???s encounter    Outpatient Encounter Medications as of 12/04/2017   Medication Sig Dispense Refill   ??? amLODIPine (NORVASC) 10 MG tablet Take 0.5 tablets (5 mg total) by mouth every evening. 90 tablet 4   ??? famotidine (PEPCID) 20 MG tablet Take 1 tablet (20 mg total) by mouth daily. 90 tablet 3   ??? MYFORTIC 180 mg EC tablet Take 3 tablets (540 mg total) by mouth Two (2) times a day. 180 tablet 11   ??? PROGRAF 1 mg capsule Take 4 capsules (4 mg total) by mouth two (2) times a day. 240 capsule 11   ??? sulfamethoxazole-trimethoprim (BACTRIM,SEPTRA) 400-80 mg per tablet Take 1 tablet (80 mg of trimethoprim total) by mouth Every Monday, Wednesday, and Friday. 12 tablet 1   ??? valGANciclovir (VALCYTE) 450 mg tablet Take 1 tablet (450 mg total) by mouth Every Monday, Wednesday, and Friday. 30 tablet 2     No facility-administered encounter medications on file as of 12/04/2017.        Induction agent : alemtuzumab    CURRENT IMMUNOSUPPRESSION: tacrolimus 4 mg PO BID  prograf/cyclosporine goal: 6-8 (lower goal in light of TMA)   myfortic540  mg PO bid    steroid free     Patient complains of tremor - mild    IMMUNOSUPPRESSION DRUG LEVELS:  Tacrolimus level on 10/04/17 was 7.1   Lab Results   Component Value Date    TACROLIMUS 8.5 12/04/2017    TACROLIMUS 7.5 11/22/2017    TACROLIMUS 7.6 11/04/2017     No results found for: CYCLO  No results found for: EVEROLIMUS  No results found for: SIROLIMUS    Prograf level is 12 hour trough    Graft function: improving  DSA: ntd  Biopsies to date:         -  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 Continue to monitor.    ID prophylaxis:   CMV Status: D+/ R+, moderate risk . CMV prophylaxis: Valcyte 450 mg Mon/Thu x 3 months per protocol completed  No results found for: CMVCP  PCP Prophylaxis: Received pentamidine dose x1 on 3/1/9 prior to Mcknight discharge and was transitioned back to Bactrim post Mcknight DC x 6 months.  Thrush: not completed inpatient 2/2 nystatin shortage  Patient is  tolerating infectious prophylaxis well    Plan: Continue to monitor.    CV Prophylaxis: Aspirin 81 mg held for thrombocytopenia on discharge from transplant and further held for hematoma post kidney biopsy  The 10-year ASCVD risk score Justin George DC Jr., et al., 2013) is: 7.2% Statin therapy: Not indicated; currently on no statin  Plan: Continue to monitor    BP: Goal < 140/90. Clinic vitals reported above  Home BP ranges: checks ~twice weekly and BP is always <120/85  Current meds include: amlodipine 5 mg daily  Plan: Continue to monitor     Anemia:  H/H:   Lab Results   Component Value Date    HGB 12.8 (L) 11/25/2017     Lab Results   Component Value Date    HCT 38.9 (L) 11/25/2017     Iron panel:  Lab Results   Component Value Date    IRON 35 11/03/2012    TIBC 261 11/03/2012    FERRITIN 860 (H) 11/03/2012     Lab Results   Component Value Date    LABIRON 13 (L) 11/03/2012     Prior ESA use: none post transplant    Plan: stable. Continue to monitor.     DM:   Lab Results   Component Value Date    A1C 5.1 09/03/2017   . Goal A1c < 7  History of Dm? No  Established with endocrinologist/PCP for BG managment? No   Fasting BG: have been in in 120s-130s.    Diet: Eating well per patient.   Exercise:Has not been exercising 2/2 fatigue  Hypoglycemia: no  Plan:  Monitor FBG; if continue to be borderline elevated may need to start checking qAM (has not checked BG before). Continue to monitor    Electrolytes: wnl   Meds currently on: none  Plan:  Continue to monitor     GI/BM: pt reports 1-2BM daily.    Meds currently on: None - although brought old vial of famotidine in to clinic with other medications  Plan: Continue to monitor.  Continue to monitor    Pain: pt reports no pain   Meds currently on: none  Plan: Continue to monitor    Bone health:   Vitamin D Level: 26.6 on 12/04/17. Goal > 30.   Last DEXA results:  none available  Current meds include: none  Plan:   start Vitamin D 1,000 units once daily at next visit . Continue to monitor.     Women's/Men's Health:  Justin Mcknight is a 62 y.o. male. Patient reports no men's/women's health issues  Plan: Continue to monitor    Adherence: Patient has average understanding of medications; was able to independently identify names/doses of immunosuppressants and OI meds.  Patient  sometimes fill their own pill box on a regular basis at home.    Patient brought medication card:yes  Pill ZOX:[id number] - had carvedilol 6.25 mg daily in AM pockets (but  not PM) and was missing Bactrim and amlodipine 5 mg tablets.  Only medication vials that patient brought with the pill box were tacrolimus, Myfortic, and famotidine. Pharmacy staff removed carvedilol from pill box and informed patient that it should be replaced with half-tablets of amlodipine.  This author confirmed with [facility] that he does have amlodipine tablets at home and that they have not mailed carvedilol since it was DC'd in April. Last Bactrim shipment was sent on April 26th so patient has likely only missed 2 doses of Bactrim.   Plan: Facilitated access for Bactrim from [facility]. Continue to check pill box for accuracy at future visit. Provided moderate adherence counseling/intervention    Spent approximately 30 minutes on educating this patient and greater than 50% was spent in direct face to face counseling regarding post transplant medication education. Questions and concerns were address to patient's satisfaction.    Patient was reviewed with Dr. Toni Arthurs who was agreement with the stated plan:     During this visit, the following was completed:   BG log data assessment  BP log data assessment  Labs ordered and evaluated  complex treatment plan >1 DS   Patient education was completed for 11-24 minutes     All questions/concerns were addressed to the patient's satisfaction.  __________________________________________  Cleone Slim, PHARMD  [employer] [facility]  PAGER [phone]

## 2017-12-05 LAB — CMV VIRAL LD: Lab: NOT DETECTED

## 2017-12-05 LAB — CMV DNA, QUANTITATIVE, PCR: CMV VIRAL LD: NOT DETECTED

## 2017-12-05 LAB — VITAMIN D, TOTAL (25OH): Lab: 26.6

## 2017-12-05 MED FILL — SULFAMETHOXAZOLE/TRIMETHO/400-80MG/TABS: SULFAMETHOXAZOLE/TRIMETHO/400-80MG/TABS | 28 days supply | Qty: 12 | Fill #0

## 2017-12-06 LAB — CBC W/ DIFFERENTIAL
BASOPHILS ABSOLUTE COUNT: 0 10*9/L
BASOPHILS ABSOLUTE COUNT: 0 10*9/L
BASOPHILS RELATIVE PERCENT: 0 %
BASOPHILS RELATIVE PERCENT: 0 %
BASOPHILS RELATIVE PERCENT: 0 %
EOSINOPHILS ABSOLUTE COUNT: 0.1 10*9/L
EOSINOPHILS ABSOLUTE COUNT: 0 10*9/L
EOSINOPHILS ABSOLUTE COUNT: 0 10*9/L
EOSINOPHILS RELATIVE PERCENT: 1 %
EOSINOPHILS RELATIVE PERCENT: 1 %
HEMATOCRIT: 38.1 % — ABNORMAL LOW
HEMATOCRIT: 39.3 % — ABNORMAL LOW
HEMATOCRIT: 40.3 %
HEMOGLOBIN: 12.6 g/dL — ABNORMAL LOW
HEMOGLOBIN: 12.8 g/dL — ABNORMAL LOW
HEMOGLOBIN: 13.3 g/dL
LYMPHOCYTES ABSOLUTE COUNT: 0.2 10*9/L — ABNORMAL LOW
LYMPHOCYTES ABSOLUTE COUNT: 0.3 10*9/L — ABNORMAL LOW
LYMPHOCYTES RELATIVE PERCENT: 6 %
LYMPHOCYTES RELATIVE PERCENT: 6 %
LYMPHOCYTES RELATIVE PERCENT: 7 %
MEAN CORPUSCULAR HEMOGLOBIN CONC: 33.1 g/dL
MEAN CORPUSCULAR HEMOGLOBIN CONC: 32.7 g/dL
MEAN CORPUSCULAR HEMOGLOBIN CONC: 33.2 g/dL
MEAN CORPUSCULAR HEMOGLOBIN: 32.3 pg
MEAN CORPUSCULAR HEMOGLOBIN: 31.9 pg
MEAN CORPUSCULAR HEMOGLOBIN: 32.3 pg
MEAN CORPUSCULAR VOLUME: 97.6 fL
MEAN CORPUSCULAR VOLUME: 97.7 fL
MEAN CORPUSCULAR VOLUME: 97.5 fL
MONOCYTES ABSOLUTE COUNT: 0.2 10*9/L
MONOCYTES ABSOLUTE COUNT: 0.2 10*9/L
MONOCYTES ABSOLUTE COUNT: 0.2 10*9/L
MONOCYTES RELATIVE PERCENT: 4 %
MONOCYTES RELATIVE PERCENT: 4 %
MONOCYTES RELATIVE PERCENT: 4 %
NEUTROPHILS ABSOLUTE COUNT: 4 10*9/L
NEUTROPHILS ABSOLUTE COUNT: 3.8 10*9/L
NEUTROPHILS ABSOLUTE COUNT: 3.5 10*9/L
NEUTROPHILS RELATIVE PERCENT: 89 %
NEUTROPHILS RELATIVE PERCENT: 89 %
PLATELET COUNT: 225 10*9/L
PLATELET COUNT: 228 10*9/L
PLATELET COUNT: 198 10*9/L
RED BLOOD CELL COUNT: 3.91 10*12/L — ABNORMAL LOW
RED BLOOD CELL COUNT: 4.02 10*12/L — ABNORMAL LOW
RED CELL DISTRIBUTION WIDTH: 15.3 % — ABNORMAL HIGH
RED CELL DISTRIBUTION WIDTH: 15.4 % — ABNORMAL HIGH
RED CELL DISTRIBUTION WIDTH: 15.4 % — ABNORMAL HIGH
WHITE BLOOD CELL COUNT: 4.5 10*9/L
WHITE BLOOD CELL COUNT: 4.2 10*9/L
WHITE BLOOD CELL COUNT: 4 10*9/L

## 2017-12-06 LAB — BASIC METABOLIC PANEL
BLOOD UREA NITROGEN: 37 mg/dL — ABNORMAL HIGH
BLOOD UREA NITROGEN: 31 mg/dL — ABNORMAL HIGH
BLOOD UREA NITROGEN: 31 mg/dL — ABNORMAL HIGH
CALCIUM: 9.5 mg/dL
CALCIUM: 9.6 mg/dL
CHLORIDE: 105 mmol/L
CO2: 25 mmol/L
CO2: 27 mmol/L
CO2: 26 mmol/L
CREATININE: 2.3 mg/dL — ABNORMAL HIGH
EGFR MDRD NON AF AMER: 29 mL/min/{1.73_m2} — ABNORMAL LOW
EGFR MDRD NON AF AMER: 28 mL/min/{1.73_m2} — ABNORMAL LOW
GLUCOSE RANDOM: 123 mg/dL — ABNORMAL HIGH
GLUCOSE RANDOM: 120 mg/dL — ABNORMAL HIGH
POTASSIUM: 5.4 mmol/L — ABNORMAL HIGH
POTASSIUM: 5.1 mmol/L
SODIUM: 136 mmol/L

## 2017-12-06 LAB — WHITE BLOOD CELL COUNT: Lab: 4

## 2017-12-06 LAB — MAGNESIUM
Lab: 1.5 — ABNORMAL LOW
Lab: 1.6 — ABNORMAL LOW
Lab: 1.8

## 2017-12-06 LAB — CO2: Lab: 27

## 2017-12-06 LAB — MICROCYTES: Lab: 0

## 2017-12-06 LAB — POTASSIUM: Lab: 5.1

## 2017-12-06 LAB — PHOSPHORUS
Lab: 2.3 — ABNORMAL LOW
Lab: 3.2
Lab: 3.2

## 2017-12-06 LAB — TACROLIMUS, TROUGH
Lab: 13.8
Lab: 9.6
Lab: 9.8

## 2017-12-06 LAB — SODIUM: Lab: 137

## 2017-12-06 LAB — EOSINOPHILS ABSOLUTE COUNT: Lab: 0.1

## 2017-12-09 ENCOUNTER — Other Ambulatory Visit
Admission: RE | Admit: 2017-12-09 | Discharge: 2017-12-09 | Disposition: A | Discharge status: Home / Self Care | Payer: Medicare Other | Source: Ambulatory Visit | Attending: Nephrology | Admitting: Nephrology

## 2017-12-09 DIAGNOSIS — N189 Chronic kidney disease, unspecified: Secondary | ICD-10-CM | POA: Diagnosis not present

## 2017-12-09 DIAGNOSIS — E559 Vitamin D deficiency, unspecified: Secondary | ICD-10-CM | POA: Diagnosis not present

## 2017-12-09 DIAGNOSIS — Z114 Encounter for screening for human immunodeficiency virus [HIV]: Secondary | ICD-10-CM | POA: Insufficient documentation

## 2017-12-09 DIAGNOSIS — E1129 Type 2 diabetes mellitus with other diabetic kidney complication: Secondary | ICD-10-CM | POA: Insufficient documentation

## 2017-12-09 DIAGNOSIS — D631 Anemia in chronic kidney disease: Secondary | ICD-10-CM | POA: Diagnosis not present

## 2017-12-09 DIAGNOSIS — D899 Disorder involving the immune mechanism, unspecified: Secondary | ICD-10-CM | POA: Diagnosis present

## 2017-12-09 DIAGNOSIS — Z09 Encounter for follow-up examination after completed treatment for conditions other than malignant neoplasm: Secondary | ICD-10-CM | POA: Diagnosis not present

## 2017-12-09 DIAGNOSIS — Z789 Other specified health status: Secondary | ICD-10-CM | POA: Insufficient documentation

## 2017-12-09 DIAGNOSIS — N390 Urinary tract infection, site not specified: Secondary | ICD-10-CM | POA: Diagnosis not present

## 2017-12-09 DIAGNOSIS — Z940 Kidney transplant status: Secondary | ICD-10-CM | POA: Diagnosis not present

## 2017-12-09 DIAGNOSIS — Z79899 Other long term (current) drug therapy: Secondary | ICD-10-CM | POA: Insufficient documentation

## 2017-12-09 DIAGNOSIS — B259 Cytomegaloviral disease, unspecified: Secondary | ICD-10-CM | POA: Insufficient documentation

## 2017-12-09 DIAGNOSIS — Z9483 Pancreas transplant status: Secondary | ICD-10-CM | POA: Insufficient documentation

## 2017-12-09 LAB — BASIC METABOLIC PANEL
Anion gap: 9 (ref 5–15)
BUN: 26 mg/dL — ABNORMAL HIGH (ref 6–20)
CALCIUM: 9.7 mg/dL (ref 8.9–10.3)
CO2: 24 mmol/L (ref 22–32)
Chloride: 104 mmol/L (ref 101–111)
Creatinine, Ser: 2.15 mg/dL — ABNORMAL HIGH (ref 0.61–1.24)
GFR calc Af Amer: 36 mL/min — ABNORMAL LOW (ref >60)
GFR, EST NON AFRICAN AMERICAN: 31 mL/min — AB (ref >60)
GLUCOSE: 116 mg/dL — AB (ref 65–99)
Potassium: 5.1 mmol/L (ref 3.5–5.1)
SODIUM: 137 mmol/L (ref 135–145)

## 2017-12-09 LAB — CBC WITH DIFFERENTIAL/PLATELET
Basophils Absolute: 0 10*3/uL (ref 0–0.1)
Basophils Relative: 1 %
EOS ABS: 0 10*3/uL (ref 0–0.7)
EOS PCT: 1 %
HCT: 42 % (ref 40.0–52.0)
Hemoglobin: 14 g/dL (ref 13.0–18.0)
LYMPHS ABS: 0.3 10*3/uL — AB (ref 1.0–3.6)
Lymphocytes Relative: 6 %
MCH: 32.3 pg (ref 26.0–34.0)
MCHC: 33.4 g/dL (ref 32.0–36.0)
MCV: 96.7 fL (ref 80.0–100.0)
MONOS PCT: 7 %
Monocytes Absolute: 0.3 10*3/uL (ref 0.2–1.0)
Neutro Abs: 4.2 10*3/uL (ref 1.4–6.5)
Neutrophils Relative %: 85 %
PLATELETS: 214 10*3/uL (ref 150–440)
RBC: 4.34 MIL/uL — ABNORMAL LOW (ref 4.40–5.90)
RDW: 14.8 % — ABNORMAL HIGH (ref 11.5–14.5)
WBC: 4.8 10*3/uL (ref 3.8–10.6)

## 2017-12-09 LAB — MAGNESIUM: Magnesium: 1.8 mg/dL (ref 1.7–2.4)

## 2017-12-09 LAB — PHOSPHORUS: PHOSPHORUS: 3.2 mg/dL (ref 2.5–4.6)

## 2017-12-10 LAB — TACROLIMUS LEVEL: TACROLIMUS (FK506) - LABCORP: 10.4 ng/mL (ref 2.0–20.0)

## 2017-12-11 LAB — HLA DS POST TRANSPLANT
ANTI-DONOR DRW #1 MFI: 211 MFI
ANTI-DONOR DRW #2 MFI: 0 MFI
ANTI-DONOR HLA-A #1 MFI: 31 MFI
ANTI-DONOR HLA-A #2 MFI: 0 MFI
ANTI-DONOR HLA-B #1 MFI: 27 MFI
ANTI-DONOR HLA-B #2 MFI: 29 MFI
ANTI-DONOR HLA-C #1 MFI: 111 MFI
ANTI-DONOR HLA-C #2 MFI: 69 MFI
ANTI-DONOR HLA-DQB #2 MFI: 0 MFI
ANTI-DONOR HLA-DR #1 MFI: 0 MFI
ANTI-DONOR HLA-DR #2 MFI: 12 MFI

## 2017-12-11 LAB — FSAB CLASS 1 ANTIBODY SPECIFICITY

## 2017-12-11 LAB — DONOR HLA-DR ANTIGEN #1

## 2017-12-11 LAB — HLA CLASS 1 ANTIBODY RESULT: Lab: NEGATIVE

## 2017-12-11 LAB — HLA CL2 AB RESULT: Lab: NEGATIVE

## 2017-12-16 ENCOUNTER — Other Ambulatory Visit
Admission: RE | Admit: 2017-12-16 | Discharge: 2017-12-16 | Disposition: A | Discharge status: Home / Self Care | Payer: Medicare Other | Source: Ambulatory Visit | Attending: Nephrology | Admitting: Nephrology

## 2017-12-16 DIAGNOSIS — E1122 Type 2 diabetes mellitus with diabetic chronic kidney disease: Secondary | ICD-10-CM | POA: Insufficient documentation

## 2017-12-16 DIAGNOSIS — Z79899 Other long term (current) drug therapy: Secondary | ICD-10-CM | POA: Diagnosis not present

## 2017-12-16 DIAGNOSIS — N189 Chronic kidney disease, unspecified: Secondary | ICD-10-CM | POA: Insufficient documentation

## 2017-12-16 DIAGNOSIS — D899 Disorder involving the immune mechanism, unspecified: Secondary | ICD-10-CM | POA: Diagnosis present

## 2017-12-16 DIAGNOSIS — E559 Vitamin D deficiency, unspecified: Secondary | ICD-10-CM | POA: Diagnosis not present

## 2017-12-16 DIAGNOSIS — D631 Anemia in chronic kidney disease: Secondary | ICD-10-CM | POA: Diagnosis not present

## 2017-12-16 DIAGNOSIS — N390 Urinary tract infection, site not specified: Secondary | ICD-10-CM | POA: Diagnosis not present

## 2017-12-16 DIAGNOSIS — E1129 Type 2 diabetes mellitus with other diabetic kidney complication: Secondary | ICD-10-CM | POA: Insufficient documentation

## 2017-12-16 DIAGNOSIS — Z114 Encounter for screening for human immunodeficiency virus [HIV]: Secondary | ICD-10-CM | POA: Diagnosis not present

## 2017-12-16 DIAGNOSIS — B259 Cytomegaloviral disease, unspecified: Secondary | ICD-10-CM | POA: Diagnosis not present

## 2017-12-16 DIAGNOSIS — Z940 Kidney transplant status: Secondary | ICD-10-CM | POA: Insufficient documentation

## 2017-12-16 DIAGNOSIS — Z789 Other specified health status: Secondary | ICD-10-CM | POA: Insufficient documentation

## 2017-12-16 DIAGNOSIS — Z09 Encounter for follow-up examination after completed treatment for conditions other than malignant neoplasm: Secondary | ICD-10-CM | POA: Diagnosis not present

## 2017-12-16 DIAGNOSIS — Z9483 Pancreas transplant status: Secondary | ICD-10-CM | POA: Diagnosis not present

## 2017-12-16 LAB — CBC WITH DIFFERENTIAL/PLATELET
BASOS ABS: 0 10*3/uL (ref 0–0.1)
BASOS PCT: 0 %
Eosinophils Absolute: 0 10*3/uL (ref 0–0.7)
Eosinophils Relative: 1 %
HEMATOCRIT: 42 % (ref 40.0–52.0)
HEMOGLOBIN: 13.8 g/dL (ref 13.0–18.0)
Lymphocytes Relative: 6 %
Lymphs Abs: 0.3 10*3/uL — ABNORMAL LOW (ref 1.0–3.6)
MCH: 31.4 pg (ref 26.0–34.0)
MCHC: 32.8 g/dL (ref 32.0–36.0)
MCV: 95.8 fL (ref 80.0–100.0)
MONOS PCT: 8 %
Monocytes Absolute: 0.4 10*3/uL (ref 0.2–1.0)
NEUTROS ABS: 4.4 10*3/uL (ref 1.4–6.5)
NEUTROS PCT: 85 %
Platelets: 213 10*3/uL (ref 150–440)
RBC: 4.38 MIL/uL — AB (ref 4.40–5.90)
RDW: 14.9 % — ABNORMAL HIGH (ref 11.5–14.5)
WBC: 5.1 10*3/uL (ref 3.8–10.6)

## 2017-12-16 LAB — BASIC METABOLIC PANEL
Anion gap: 8 (ref 5–15)
BUN: 33 mg/dL — AB (ref 6–20)
CHLORIDE: 105 mmol/L (ref 101–111)
CO2: 23 mmol/L (ref 22–32)
Calcium: 9.5 mg/dL (ref 8.9–10.3)
Creatinine, Ser: 1.95 mg/dL — ABNORMAL HIGH (ref 0.61–1.24)
GFR calc Af Amer: 41 mL/min — ABNORMAL LOW (ref >60)
GFR calc non Af Amer: 35 mL/min — ABNORMAL LOW (ref >60)
Glucose, Bld: 116 mg/dL — ABNORMAL HIGH (ref 65–99)
POTASSIUM: 4.7 mmol/L (ref 3.5–5.1)
SODIUM: 136 mmol/L (ref 135–145)

## 2017-12-16 LAB — PHOSPHORUS: Phosphorus: 3 mg/dL (ref 2.5–4.6)

## 2017-12-16 LAB — MAGNESIUM: Magnesium: 1.7 mg/dL (ref 1.7–2.4)

## 2017-12-17 LAB — CBC W/ DIFFERENTIAL
BASOPHILS ABSOLUTE COUNT: 1 10*9/L
EOSINOPHILS ABSOLUTE COUNT: 1 10*9/L
EOSINOPHILS ABSOLUTE COUNT: 0 10*9/L
HEMATOCRIT: 42 %
HEMATOCRIT: 42 %
HEMOGLOBIN: 14 g/dL
HEMOGLOBIN: 13.8 g/dL
LYMPHOCYTES ABSOLUTE COUNT: 0.3 10*9/L — ABNORMAL LOW
LYMPHOCYTES ABSOLUTE COUNT: 0.3 10*9/L — ABNORMAL LOW
LYMPHOCYTES RELATIVE PERCENT: 6 %
MEAN CORPUSCULAR HEMOGLOBIN CONC: 39 g/dL
MEAN CORPUSCULAR HEMOGLOBIN: 31.4 pg
MEAN CORPUSCULAR VOLUME: 96.7 fL
MEAN CORPUSCULAR VOLUME: 95.8 fL
MONOCYTES ABSOLUTE COUNT: 0.3 10*9/L
MONOCYTES RELATIVE PERCENT: 7 %
MONOCYTES RELATIVE PERCENT: 8 %
NEUTROPHILS RELATIVE PERCENT: 85 %
NEUTROPHILS RELATIVE PERCENT: 85 %
PLATELET COUNT: 214 10*9/L
PLATELET COUNT: 213 10*9/L
RED BLOOD CELL COUNT: 4.34 10*12/L — ABNORMAL LOW
RED BLOOD CELL COUNT: 4.38 10*12/L — ABNORMAL LOW
RED CELL DISTRIBUTION WIDTH: 14.8 % — ABNORMAL HIGH
WBC ADJUSTED: 4.8 10*9/L
WBC ADJUSTED: 5.1 10*9/L

## 2017-12-17 LAB — BASIC METABOLIC PANEL
BLOOD UREA NITROGEN: 26 mg/dL — ABNORMAL HIGH
BLOOD UREA NITROGEN: 33 mg/dL — ABNORMAL HIGH
CALCIUM: 9.7 mg/dL
CALCIUM: 9.5 mg/dL
CHLORIDE: 104 mmol/L
CO2: 24 mmol/L
CREATININE: 2.15 mg/dL — ABNORMAL HIGH
CREATININE: 1.95 mg/dL — ABNORMAL HIGH
EGFR MDRD AF AMER: 41 mL/min/{1.73_m2} — ABNORMAL LOW
EGFR MDRD NON AF AMER: 31 mL/min/{1.73_m2} — ABNORMAL LOW
EGFR MDRD NON AF AMER: 35 mL/min/{1.73_m2} — ABNORMAL LOW
GLUCOSE RANDOM: 116 mg/dL — ABNORMAL HIGH
POTASSIUM: 5.1 mmol/L
POTASSIUM: 1.7 mmol/L

## 2017-12-17 LAB — PHOSPHORUS: Lab: 3

## 2017-12-17 LAB — MAGNESIUM: Lab: 1.7

## 2017-12-17 LAB — RED BLOOD CELL COUNT: Lab: 4.34 — ABNORMAL LOW

## 2017-12-17 LAB — PLATELET COUNT: Lab: 213

## 2017-12-17 LAB — EGFR MDRD NON AF AMER: Lab: 31 — ABNORMAL LOW

## 2017-12-17 LAB — CREATININE: Lab: 1.95 — ABNORMAL HIGH

## 2017-12-18 LAB — TACROLIMUS LEVEL: Tacrolimus (FK506) - LabCorp: 10.9 ng/mL (ref 2.0–20.0)

## 2017-12-23 NOTE — Unmapped (Signed)
[facility] Refill Coordination Note  Specialty Medication(s): Prograf, Myfortic  Additional Medications shipped: bactrim    [address] [employer], DOB: [birth year]  Phone: [phone] (home) , Alternate phone contact: N/A  Phone or address changes today?: No  All above HIPAA information was verified with patient.  Shipping Address: [address]  [place] [address]   Insurance changes? No    Completed refill call assessment today to schedule patient's medication shipment from the [facility] [phone]).      Confirmed the medication and dosage are correct and have not changed: Yes, regimen is correct and unchanged.    Confirmed patient started or stopped the following medications in the past month:  No, there are no changes reported at this time.    Are you tolerating your medication?:  Justin Mcknight reports tolerating the medication.    ADHERENCE    Did you miss any doses in the past 4 weeks? No missed doses reported.      Prograf 1 mg   Quantity filled last month: 240   # of tablets left on hand: 104    Myfortic 180 mg   Quantity filled last month: 180   # of tablets left on hand: 60      FINANCIAL/SHIPPING    Delivery Scheduled: Yes, Expected medication delivery date: Monday, June 24     The patient will receive an FSI print out for each medication shipped and additional FDA Medication Guides as required.  Patient education from [place] or [name] may also be included in the shipment    Wynn did not have any additional questions at this time.    Delivery address validated in FSI scheduling system: Yes, address listed in FSI is correct.    We will follow up with patient monthly for standard refill processing and delivery.      Thank you,  Tawanna Solo Shared [facility] Specialty Pharmacist

## 2017-12-23 NOTE — Unmapped (Signed)
[facility] Pharm[facility] Refill Coordination Note  Medication: Prograf 1mg  & Myfortic 180mg     Unable to reach patient to schedule shipment for medication being filled at [facility]. Left voicemail on phone.  As this is the 3rd unsuccessful attempt to reach the patient, no additional phone call attempts will be made at this time.      Phone numbers attempted: [phone] Judie Petit) & [phone] (H)    Last scheduled delivery: 11/20/2017    Please call the [facility] at [phone] (option 4) should you have any further questions.      Thanks,  [facility] Specialty Team

## 2017-12-24 ENCOUNTER — Other Ambulatory Visit
Admission: RE | Admit: 2017-12-24 | Discharge: 2017-12-24 | Disposition: A | Discharge status: Home / Self Care | Payer: Medicare Other | Source: Ambulatory Visit | Attending: Nephrology | Admitting: Nephrology

## 2017-12-24 DIAGNOSIS — E559 Vitamin D deficiency, unspecified: Secondary | ICD-10-CM | POA: Diagnosis not present

## 2017-12-24 DIAGNOSIS — Z79899 Other long term (current) drug therapy: Secondary | ICD-10-CM | POA: Diagnosis not present

## 2017-12-24 DIAGNOSIS — Y838 Other surgical procedures as the cause of abnormal reaction of the patient, or of later complication, without mention of misadventure at the time of the procedure: Secondary | ICD-10-CM | POA: Insufficient documentation

## 2017-12-24 DIAGNOSIS — Z789 Other specified health status: Secondary | ICD-10-CM | POA: Diagnosis not present

## 2017-12-24 DIAGNOSIS — Z114 Encounter for screening for human immunodeficiency virus [HIV]: Secondary | ICD-10-CM | POA: Insufficient documentation

## 2017-12-24 DIAGNOSIS — Z09 Encounter for follow-up examination after completed treatment for conditions other than malignant neoplasm: Secondary | ICD-10-CM | POA: Insufficient documentation

## 2017-12-24 DIAGNOSIS — B259 Cytomegaloviral disease, unspecified: Secondary | ICD-10-CM | POA: Insufficient documentation

## 2017-12-24 DIAGNOSIS — E1129 Type 2 diabetes mellitus with other diabetic kidney complication: Secondary | ICD-10-CM | POA: Diagnosis not present

## 2017-12-24 DIAGNOSIS — D899 Disorder involving the immune mechanism, unspecified: Secondary | ICD-10-CM | POA: Diagnosis present

## 2017-12-24 DIAGNOSIS — T8610 Unspecified complication of kidney transplant: Secondary | ICD-10-CM | POA: Insufficient documentation

## 2017-12-24 DIAGNOSIS — Z9483 Pancreas transplant status: Secondary | ICD-10-CM | POA: Insufficient documentation

## 2017-12-24 DIAGNOSIS — D631 Anemia in chronic kidney disease: Secondary | ICD-10-CM | POA: Diagnosis not present

## 2017-12-24 DIAGNOSIS — N189 Chronic kidney disease, unspecified: Secondary | ICD-10-CM | POA: Diagnosis not present

## 2017-12-24 DIAGNOSIS — N390 Urinary tract infection, site not specified: Secondary | ICD-10-CM | POA: Diagnosis not present

## 2017-12-24 LAB — CBC WITH DIFFERENTIAL/PLATELET
Basophils Absolute: 0 10*3/uL (ref 0–0.1)
Basophils Relative: 0 %
Eosinophils Absolute: 0.1 10*3/uL (ref 0–0.7)
Eosinophils Relative: 1 %
HEMATOCRIT: 45.3 % (ref 40.0–52.0)
Hemoglobin: 14.7 g/dL (ref 13.0–18.0)
LYMPHS ABS: 0.5 10*3/uL — AB (ref 1.0–3.6)
Lymphocytes Relative: 7 %
MCH: 31.4 pg (ref 26.0–34.0)
MCHC: 32.6 g/dL (ref 32.0–36.0)
MCV: 96.4 fL (ref 80.0–100.0)
MONO ABS: 0.5 10*3/uL (ref 0.2–1.0)
MONOS PCT: 8 %
NEUTROS ABS: 5.7 10*3/uL (ref 1.4–6.5)
Neutrophils Relative %: 84 %
Platelets: 218 10*3/uL (ref 150–440)
RBC: 4.69 MIL/uL (ref 4.40–5.90)
RDW: 14.5 % (ref 11.5–14.5)
WBC: 6.8 10*3/uL (ref 3.8–10.6)

## 2017-12-24 LAB — BASIC METABOLIC PANEL
Anion gap: 8 (ref 5–15)
BUN: 36 mg/dL — ABNORMAL HIGH (ref 6–20)
CALCIUM: 9.7 mg/dL (ref 8.9–10.3)
CO2: 22 mmol/L (ref 22–32)
Chloride: 108 mmol/L (ref 101–111)
Creatinine, Ser: 2.33 mg/dL — ABNORMAL HIGH (ref 0.61–1.24)
GFR calc non Af Amer: 29 mL/min — ABNORMAL LOW (ref >60)
GFR, EST AFRICAN AMERICAN: 33 mL/min — AB (ref >60)
GLUCOSE: 127 mg/dL — AB (ref 65–99)
POTASSIUM: 4.9 mmol/L (ref 3.5–5.1)
Sodium: 138 mmol/L (ref 135–145)

## 2017-12-24 LAB — PHOSPHORUS: PHOSPHORUS: 2.9 mg/dL (ref 2.5–4.6)

## 2017-12-24 LAB — MAGNESIUM: Magnesium: 1.9 mg/dL (ref 1.7–2.4)

## 2017-12-25 LAB — TACROLIMUS LEVEL: TACROLIMUS (FK506) - LABCORP: 12.8 ng/mL (ref 2.0–20.0)

## 2017-12-27 MED FILL — SULFAMETHOXAZOLE/TRIMETHO/400-80MG/TABS: SULFAMETHOXAZOLE/TRIMETHO/400-80MG/TABS | 28 days supply | Qty: 12 | Fill #1

## 2017-12-27 MED FILL — PROGRAF/1MG/CAP: PROGRAF/1MG/CAP | 30 days supply | Qty: 240 | Fill #2

## 2017-12-27 MED FILL — MYFORTIC/180MG/TAB: MYFORTIC/180MG/TAB | 30 days supply | Qty: 180 | Fill #3

## 2017-12-30 ENCOUNTER — Other Ambulatory Visit
Admission: RE | Admit: 2017-12-30 | Discharge: 2017-12-30 | Disposition: A | Discharge status: Home / Self Care | Payer: Medicare Other | Source: Ambulatory Visit | Attending: Nephrology | Admitting: Nephrology

## 2017-12-30 DIAGNOSIS — B259 Cytomegaloviral disease, unspecified: Secondary | ICD-10-CM | POA: Insufficient documentation

## 2017-12-30 DIAGNOSIS — Z114 Encounter for screening for human immunodeficiency virus [HIV]: Secondary | ICD-10-CM | POA: Insufficient documentation

## 2017-12-30 DIAGNOSIS — Z79899 Other long term (current) drug therapy: Secondary | ICD-10-CM | POA: Diagnosis not present

## 2017-12-30 DIAGNOSIS — N189 Chronic kidney disease, unspecified: Secondary | ICD-10-CM | POA: Diagnosis not present

## 2017-12-30 DIAGNOSIS — D631 Anemia in chronic kidney disease: Secondary | ICD-10-CM | POA: Insufficient documentation

## 2017-12-30 DIAGNOSIS — Z940 Kidney transplant status: Secondary | ICD-10-CM | POA: Insufficient documentation

## 2017-12-30 DIAGNOSIS — E559 Vitamin D deficiency, unspecified: Secondary | ICD-10-CM | POA: Insufficient documentation

## 2017-12-30 DIAGNOSIS — E1122 Type 2 diabetes mellitus with diabetic chronic kidney disease: Secondary | ICD-10-CM | POA: Insufficient documentation

## 2017-12-30 DIAGNOSIS — Z9483 Pancreas transplant status: Secondary | ICD-10-CM | POA: Insufficient documentation

## 2017-12-30 DIAGNOSIS — N390 Urinary tract infection, site not specified: Secondary | ICD-10-CM | POA: Diagnosis not present

## 2017-12-30 DIAGNOSIS — Z789 Other specified health status: Secondary | ICD-10-CM | POA: Diagnosis not present

## 2017-12-30 DIAGNOSIS — E1129 Type 2 diabetes mellitus with other diabetic kidney complication: Secondary | ICD-10-CM | POA: Diagnosis not present

## 2017-12-30 DIAGNOSIS — D899 Disorder involving the immune mechanism, unspecified: Secondary | ICD-10-CM | POA: Diagnosis present

## 2017-12-30 LAB — BASIC METABOLIC PANEL
Anion gap: 10 (ref 5–15)
BUN: 27 mg/dL — AB (ref 6–20)
CALCIUM: 9.2 mg/dL (ref 8.9–10.3)
CO2: 22 mmol/L (ref 22–32)
Chloride: 105 mmol/L (ref 101–111)
Creatinine, Ser: 1.8 mg/dL — ABNORMAL HIGH (ref 0.61–1.24)
GFR calc Af Amer: 45 mL/min — ABNORMAL LOW (ref >60)
GFR calc non Af Amer: 39 mL/min — ABNORMAL LOW (ref >60)
GLUCOSE: 118 mg/dL — AB (ref 65–99)
Potassium: 4.4 mmol/L (ref 3.5–5.1)
Sodium: 137 mmol/L (ref 135–145)

## 2017-12-30 LAB — CBC WITH DIFFERENTIAL/PLATELET
BASOS ABS: 0 10*3/uL (ref 0–0.1)
Basophils Relative: 0 %
EOS PCT: 1 %
Eosinophils Absolute: 0.1 10*3/uL (ref 0–0.7)
HCT: 43.8 % (ref 40.0–52.0)
HEMOGLOBIN: 14.5 g/dL (ref 13.0–18.0)
LYMPHS ABS: 0.4 10*3/uL — AB (ref 1.0–3.6)
Lymphocytes Relative: 7 %
MCH: 31.5 pg (ref 26.0–34.0)
MCHC: 33.2 g/dL (ref 32.0–36.0)
MCV: 95 fL (ref 80.0–100.0)
Monocytes Absolute: 0.4 10*3/uL (ref 0.2–1.0)
Monocytes Relative: 6 %
NEUTROS PCT: 86 %
Neutro Abs: 5.7 10*3/uL (ref 1.4–6.5)
PLATELETS: 222 10*3/uL (ref 150–440)
RBC: 4.61 MIL/uL (ref 4.40–5.90)
RDW: 14.6 % — ABNORMAL HIGH (ref 11.5–14.5)
WBC: 6.7 10*3/uL (ref 3.8–10.6)

## 2017-12-30 LAB — PHOSPHORUS: PHOSPHORUS: 2.6 mg/dL (ref 2.5–4.6)

## 2017-12-30 LAB — MAGNESIUM: Magnesium: 1.8 mg/dL (ref 1.7–2.4)

## 2018-01-01 LAB — TACROLIMUS LEVEL: TACROLIMUS (FK506) - LABCORP: 12 ng/mL (ref 2.0–20.0)

## 2018-01-07 ENCOUNTER — Other Ambulatory Visit
Admission: RE | Admit: 2018-01-07 | Discharge: 2018-01-07 | Disposition: A | Discharge status: Home / Self Care | Payer: Medicare Other | Source: Ambulatory Visit | Attending: Nephrology | Admitting: Nephrology

## 2018-01-07 DIAGNOSIS — B259 Cytomegaloviral disease, unspecified: Secondary | ICD-10-CM | POA: Insufficient documentation

## 2018-01-07 DIAGNOSIS — D899 Disorder involving the immune mechanism, unspecified: Secondary | ICD-10-CM | POA: Diagnosis present

## 2018-01-07 DIAGNOSIS — Z79899 Other long term (current) drug therapy: Secondary | ICD-10-CM | POA: Diagnosis not present

## 2018-01-07 DIAGNOSIS — Z114 Encounter for screening for human immunodeficiency virus [HIV]: Secondary | ICD-10-CM | POA: Insufficient documentation

## 2018-01-07 DIAGNOSIS — E559 Vitamin D deficiency, unspecified: Secondary | ICD-10-CM | POA: Diagnosis not present

## 2018-01-07 DIAGNOSIS — E1129 Type 2 diabetes mellitus with other diabetic kidney complication: Secondary | ICD-10-CM | POA: Insufficient documentation

## 2018-01-07 DIAGNOSIS — Z940 Kidney transplant status: Secondary | ICD-10-CM | POA: Diagnosis not present

## 2018-01-07 DIAGNOSIS — Z789 Other specified health status: Secondary | ICD-10-CM | POA: Diagnosis not present

## 2018-01-07 DIAGNOSIS — Z9483 Pancreas transplant status: Secondary | ICD-10-CM | POA: Diagnosis not present

## 2018-01-07 DIAGNOSIS — D631 Anemia in chronic kidney disease: Secondary | ICD-10-CM | POA: Diagnosis not present

## 2018-01-07 DIAGNOSIS — N189 Chronic kidney disease, unspecified: Secondary | ICD-10-CM | POA: Insufficient documentation

## 2018-01-07 DIAGNOSIS — N390 Urinary tract infection, site not specified: Secondary | ICD-10-CM | POA: Diagnosis not present

## 2018-01-07 DIAGNOSIS — E1122 Type 2 diabetes mellitus with diabetic chronic kidney disease: Secondary | ICD-10-CM | POA: Insufficient documentation

## 2018-01-07 LAB — MAGNESIUM: Magnesium: 1.8 mg/dL (ref 1.7–2.4)

## 2018-01-07 LAB — BASIC METABOLIC PANEL
Anion gap: 7 (ref 5–15)
BUN: 27 mg/dL — AB (ref 8–23)
CALCIUM: 9.4 mg/dL (ref 8.9–10.3)
CO2: 25 mmol/L (ref 22–32)
Chloride: 107 mmol/L (ref 98–111)
Creatinine, Ser: 1.77 mg/dL — ABNORMAL HIGH (ref 0.61–1.24)
GFR calc Af Amer: 46 mL/min — ABNORMAL LOW (ref >60)
GFR, EST NON AFRICAN AMERICAN: 40 mL/min — AB (ref >60)
Glucose, Bld: 122 mg/dL — ABNORMAL HIGH (ref 70–99)
POTASSIUM: 4.9 mmol/L (ref 3.5–5.1)
Sodium: 139 mmol/L (ref 135–145)

## 2018-01-07 LAB — CBC WITH DIFFERENTIAL/PLATELET
BASOS PCT: 0 %
Basophils Absolute: 0 10*3/uL (ref 0–0.1)
EOS ABS: 0.1 10*3/uL (ref 0–0.7)
EOS PCT: 2 %
HCT: 42.9 % (ref 40.0–52.0)
HEMOGLOBIN: 14.6 g/dL (ref 13.0–18.0)
LYMPHS ABS: 0.4 10*3/uL — AB (ref 1.0–3.6)
Lymphocytes Relative: 7 %
MCH: 32.1 pg (ref 26.0–34.0)
MCHC: 34 g/dL (ref 32.0–36.0)
MCV: 94.6 fL (ref 80.0–100.0)
MONO ABS: 0.4 10*3/uL (ref 0.2–1.0)
MONOS PCT: 7 %
NEUTROS PCT: 84 %
Neutro Abs: 5 10*3/uL (ref 1.4–6.5)
PLATELETS: 202 10*3/uL (ref 150–440)
RBC: 4.53 MIL/uL (ref 4.40–5.90)
RDW: 14.3 % (ref 11.5–14.5)
WBC: 5.9 10*3/uL (ref 3.8–10.6)

## 2018-01-07 LAB — PHOSPHORUS: PHOSPHORUS: 2.8 mg/dL (ref 2.5–4.6)

## 2018-01-08 LAB — TACROLIMUS LEVEL: Tacrolimus (FK506) - LabCorp: 11.3 ng/mL (ref 2.0–20.0)

## 2018-01-14 ENCOUNTER — Other Ambulatory Visit
Admission: RE | Admit: 2018-01-14 | Discharge: 2018-01-14 | Disposition: A | Discharge status: Home / Self Care | Payer: Medicare Other | Source: Ambulatory Visit | Attending: Nephrology | Admitting: Nephrology

## 2018-01-14 DIAGNOSIS — Z09 Encounter for follow-up examination after completed treatment for conditions other than malignant neoplasm: Secondary | ICD-10-CM | POA: Insufficient documentation

## 2018-01-14 DIAGNOSIS — N390 Urinary tract infection, site not specified: Secondary | ICD-10-CM | POA: Insufficient documentation

## 2018-01-14 DIAGNOSIS — E1129 Type 2 diabetes mellitus with other diabetic kidney complication: Secondary | ICD-10-CM | POA: Insufficient documentation

## 2018-01-14 DIAGNOSIS — E559 Vitamin D deficiency, unspecified: Secondary | ICD-10-CM | POA: Diagnosis not present

## 2018-01-14 DIAGNOSIS — Z114 Encounter for screening for human immunodeficiency virus [HIV]: Secondary | ICD-10-CM | POA: Insufficient documentation

## 2018-01-14 DIAGNOSIS — Z940 Kidney transplant status: Secondary | ICD-10-CM | POA: Insufficient documentation

## 2018-01-14 DIAGNOSIS — Z9483 Pancreas transplant status: Secondary | ICD-10-CM | POA: Insufficient documentation

## 2018-01-14 DIAGNOSIS — Z789 Other specified health status: Secondary | ICD-10-CM | POA: Insufficient documentation

## 2018-01-14 DIAGNOSIS — B269 Mumps without complication: Secondary | ICD-10-CM | POA: Insufficient documentation

## 2018-01-14 DIAGNOSIS — D631 Anemia in chronic kidney disease: Secondary | ICD-10-CM | POA: Diagnosis not present

## 2018-01-14 DIAGNOSIS — Z79899 Other long term (current) drug therapy: Secondary | ICD-10-CM | POA: Diagnosis present

## 2018-01-14 LAB — CBC WITH DIFFERENTIAL/PLATELET
Basophils Absolute: 0.1 10*3/uL (ref 0–0.1)
Basophils Relative: 1 %
EOS PCT: 2 %
Eosinophils Absolute: 0.1 10*3/uL (ref 0–0.7)
HCT: 45.6 % (ref 40.0–52.0)
HEMOGLOBIN: 14.9 g/dL (ref 13.0–18.0)
LYMPHS PCT: 7 %
Lymphs Abs: 0.5 10*3/uL — ABNORMAL LOW (ref 1.0–3.6)
MCH: 30.9 pg (ref 26.0–34.0)
MCHC: 32.6 g/dL (ref 32.0–36.0)
MCV: 94.8 fL (ref 80.0–100.0)
Monocytes Absolute: 0.5 10*3/uL (ref 0.2–1.0)
Monocytes Relative: 7 %
NEUTROS ABS: 5.7 10*3/uL (ref 1.4–6.5)
Neutrophils Relative %: 83 %
PLATELETS: 217 10*3/uL (ref 150–440)
RBC: 4.81 MIL/uL (ref 4.40–5.90)
RDW: 14.3 % (ref 11.5–14.5)
WBC: 6.9 10*3/uL (ref 3.8–10.6)

## 2018-01-14 LAB — BASIC METABOLIC PANEL
Anion gap: 6 (ref 5–15)
BUN: 30 mg/dL — AB (ref 8–23)
CHLORIDE: 108 mmol/L (ref 98–111)
CO2: 25 mmol/L (ref 22–32)
Calcium: 9.3 mg/dL (ref 8.9–10.3)
Creatinine, Ser: 2.1 mg/dL — ABNORMAL HIGH (ref 0.61–1.24)
GFR calc Af Amer: 37 mL/min — ABNORMAL LOW (ref >60)
GFR calc non Af Amer: 32 mL/min — ABNORMAL LOW (ref >60)
Glucose, Bld: 123 mg/dL — ABNORMAL HIGH (ref 70–99)
POTASSIUM: 5 mmol/L (ref 3.5–5.1)
SODIUM: 139 mmol/L (ref 135–145)

## 2018-01-14 LAB — MAGNESIUM: Magnesium: 1.7 mg/dL (ref 1.7–2.4)

## 2018-01-14 LAB — PHOSPHORUS: Phosphorus: 3 mg/dL (ref 2.5–4.6)

## 2018-01-15 LAB — CBC W/ DIFFERENTIAL
BASOPHILS ABSOLUTE COUNT: 0 10*9/L
BASOPHILS RELATIVE PERCENT: 0 %
EOSINOPHILS ABSOLUTE COUNT: 0.1 10*9/L
EOSINOPHILS RELATIVE PERCENT: 1 %
HEMATOCRIT: 43.8 %
HEMOGLOBIN: 14.5 g/dL
LYMPHOCYTES ABSOLUTE COUNT: 0.4 10*9/L — ABNORMAL LOW
LYMPHOCYTES RELATIVE PERCENT: 7 %
MEAN CORPUSCULAR HEMOGLOBIN CONC: 33.2 g/dL
MEAN CORPUSCULAR HEMOGLOBIN: 31.5 pg
MEAN CORPUSCULAR VOLUME: 95 fL
MONOCYTES ABSOLUTE COUNT: 0.4 10*9/L
MONOCYTES RELATIVE PERCENT: 6 %
NEUTROPHILS ABSOLUTE COUNT: 5.7 10*9/L
PLATELET COUNT: 222 10*9/L
RED BLOOD CELL COUNT: 4.61 10*12/L
RED CELL DISTRIBUTION WIDTH: 14.6 % — ABNORMAL HIGH

## 2018-01-15 LAB — BASIC METABOLIC PANEL
CALCIUM: 9.2 mg/dL
CO2: 22 mmol/L
CREATININE: 1.8 mg/dL
EGFR MDRD AF AMER: 45 mL/min/{1.73_m2}
EGFR MDRD NON AF AMER: 39 mL/min/{1.73_m2}
GLUCOSE RANDOM: 118 mg/dL
POTASSIUM: 4.4 mmol/L
SODIUM: 137 mmol/L

## 2018-01-15 LAB — SODIUM: Lab: 137

## 2018-01-15 LAB — HEMATOCRIT: Lab: 43.8

## 2018-01-15 LAB — MAGNESIUM: Lab: 1.8

## 2018-01-16 LAB — TACROLIMUS LEVEL: Tacrolimus (FK506) - LabCorp: 10.2 ng/mL (ref 2.0–20.0)

## 2018-01-21 ENCOUNTER — Other Ambulatory Visit
Admission: RE | Admit: 2018-01-21 | Discharge: 2018-01-21 | Disposition: A | Discharge status: Home / Self Care | Payer: Medicare Other | Source: Ambulatory Visit | Attending: Nephrology | Admitting: Nephrology

## 2018-01-21 DIAGNOSIS — E559 Vitamin D deficiency, unspecified: Secondary | ICD-10-CM | POA: Insufficient documentation

## 2018-01-21 DIAGNOSIS — N390 Urinary tract infection, site not specified: Secondary | ICD-10-CM | POA: Insufficient documentation

## 2018-01-21 DIAGNOSIS — D631 Anemia in chronic kidney disease: Secondary | ICD-10-CM | POA: Insufficient documentation

## 2018-01-21 DIAGNOSIS — Z114 Encounter for screening for human immunodeficiency virus [HIV]: Secondary | ICD-10-CM | POA: Diagnosis not present

## 2018-01-21 DIAGNOSIS — E1129 Type 2 diabetes mellitus with other diabetic kidney complication: Secondary | ICD-10-CM | POA: Insufficient documentation

## 2018-01-21 DIAGNOSIS — Z789 Other specified health status: Secondary | ICD-10-CM | POA: Insufficient documentation

## 2018-01-21 DIAGNOSIS — B269 Mumps without complication: Secondary | ICD-10-CM | POA: Diagnosis not present

## 2018-01-21 DIAGNOSIS — D899 Disorder involving the immune mechanism, unspecified: Secondary | ICD-10-CM | POA: Insufficient documentation

## 2018-01-21 DIAGNOSIS — Z79899 Other long term (current) drug therapy: Secondary | ICD-10-CM | POA: Diagnosis present

## 2018-01-21 DIAGNOSIS — Z09 Encounter for follow-up examination after completed treatment for conditions other than malignant neoplasm: Secondary | ICD-10-CM | POA: Insufficient documentation

## 2018-01-21 DIAGNOSIS — Z940 Kidney transplant status: Secondary | ICD-10-CM | POA: Diagnosis present

## 2018-01-21 DIAGNOSIS — Z9483 Pancreas transplant status: Secondary | ICD-10-CM | POA: Diagnosis not present

## 2018-01-21 LAB — CBC WITH DIFFERENTIAL/PLATELET
Basophils Absolute: 0.1 10*3/uL (ref 0–0.1)
Basophils Relative: 1 %
Eosinophils Absolute: 0.1 10*3/uL (ref 0–0.7)
Eosinophils Relative: 2 %
HEMATOCRIT: 46.3 % (ref 40.0–52.0)
Hemoglobin: 15 g/dL (ref 13.0–18.0)
LYMPHS ABS: 0.5 10*3/uL — AB (ref 1.0–3.6)
Lymphocytes Relative: 8 %
MCH: 30.7 pg (ref 26.0–34.0)
MCHC: 32.3 g/dL (ref 32.0–36.0)
MCV: 95.1 fL (ref 80.0–100.0)
MONOS PCT: 9 %
Monocytes Absolute: 0.6 10*3/uL (ref 0.2–1.0)
NEUTROS ABS: 5.6 10*3/uL (ref 1.4–6.5)
Neutrophils Relative %: 80 %
Platelets: 208 10*3/uL (ref 150–440)
RBC: 4.87 MIL/uL (ref 4.40–5.90)
RDW: 14.2 % (ref 11.5–14.5)
WBC: 6.9 10*3/uL (ref 3.8–10.6)

## 2018-01-21 LAB — BASIC METABOLIC PANEL
ANION GAP: 6 (ref 5–15)
BLOOD UREA NITROGEN: 27 mg/dL — ABNORMAL HIGH
BUN: 28 mg/dL — AB (ref 8–23)
CALCIUM: 9.4 mg/dL
CALCIUM: 9.4 mg/dL (ref 8.9–10.3)
CHLORIDE: 107 mmol/L
CO2: 25 mmol/L
CO2: 24 mmol/L (ref 22–32)
CREATININE: 1.77 mg/dL — ABNORMAL HIGH
Chloride: 108 mmol/L (ref 98–111)
Creatinine, Ser: 2.1 mg/dL — ABNORMAL HIGH (ref 0.61–1.24)
EGFR MDRD NON AF AMER: 40 mL/min/{1.73_m2} — ABNORMAL LOW
GFR calc Af Amer: 37 mL/min — ABNORMAL LOW (ref >60)
GFR calc non Af Amer: 32 mL/min — ABNORMAL LOW (ref >60)
GLUCOSE: 117 mg/dL — AB (ref 70–99)
POTASSIUM: 4.9 mmol/L
POTASSIUM: 4.6 mmol/L (ref 3.5–5.1)
SODIUM: 138 mmol/L (ref 135–145)

## 2018-01-21 LAB — MAGNESIUM
Lab: 1.8
Magnesium: 1.8 mg/dL (ref 1.7–2.4)

## 2018-01-21 LAB — PHOSPHORUS
Lab: 2.8
Phosphorus: 3 mg/dL (ref 2.5–4.6)

## 2018-01-21 LAB — CBC W/ DIFFERENTIAL
BASOPHILS ABSOLUTE COUNT: 0 10*9/L
BASOPHILS RELATIVE PERCENT: 0 %
EOSINOPHILS ABSOLUTE COUNT: 0.1 10*9/L
EOSINOPHILS RELATIVE PERCENT: 2 %
HEMATOCRIT: 42.9 %
HEMOGLOBIN: 14.6 g/dL
LYMPHOCYTES ABSOLUTE COUNT: 0.4 10*9/L — ABNORMAL LOW
LYMPHOCYTES RELATIVE PERCENT: 7 %
MEAN CORPUSCULAR HEMOGLOBIN CONC: 34 g/dL
MEAN CORPUSCULAR HEMOGLOBIN: 32.1 pg
MEAN CORPUSCULAR VOLUME: 94.6 fL
MONOCYTES ABSOLUTE COUNT: 0.4 10*9/L
MONOCYTES RELATIVE PERCENT: 7 %
NEUTROPHILS ABSOLUTE COUNT: 5 10*9/L
NEUTROPHILS RELATIVE PERCENT: 84 %
PLATELET COUNT: 202 10*9/L
RED BLOOD CELL COUNT: 4.53 10*12/L
RED CELL DISTRIBUTION WIDTH: 14.3 %
WBC ADJUSTED: 5.9 10*9/L

## 2018-01-21 LAB — MACROCYTES: Lab: 0

## 2018-01-21 LAB — EGFR MDRD NON AF AMER: Lab: 40 — ABNORMAL LOW

## 2018-01-21 LAB — TACROLIMUS, TROUGH: Lab: 12

## 2018-01-21 NOTE — Unmapped (Signed)
Patient does not need a refill of specialty medication at this time. Moving specialty refill reminder call to appropriate date and removed call attempt data.  Spoke with patient and he states that he has enough medication for 2 more weeks and does not need any refills at this time.  Patient states he will call us next week to schedule something.

## 2018-01-22 LAB — CBC W/ DIFFERENTIAL
BASOPHILS ABSOLUTE COUNT: 0.1 10*9/L
BASOPHILS RELATIVE PERCENT: 1 %
EOSINOPHILS ABSOLUTE COUNT: 0.1 10*9/L
EOSINOPHILS RELATIVE PERCENT: 2 %
HEMATOCRIT: 45.6 %
LYMPHOCYTES ABSOLUTE COUNT: 0.5 10*9/L — ABNORMAL LOW
LYMPHOCYTES RELATIVE PERCENT: 7 %
MEAN CORPUSCULAR HEMOGLOBIN CONC: 32.6 g/dL
MEAN CORPUSCULAR HEMOGLOBIN: 30.9 pg
MEAN CORPUSCULAR VOLUME: 94.8 fL
MONOCYTES ABSOLUTE COUNT: 0.5 10*9/L
MONOCYTES RELATIVE PERCENT: 7 %
NEUTROPHILS ABSOLUTE COUNT: 5.9 10*9/L
NEUTROPHILS RELATIVE PERCENT: 83 %
PLATELET COUNT: 217 10*9/L
RED BLOOD CELL COUNT: 4.81 10*12/L
RED CELL DISTRIBUTION WIDTH: 14.3 %
WBC ADJUSTED: 6.9 10*9/L

## 2018-01-22 LAB — BASIC METABOLIC PANEL
BLOOD UREA NITROGEN: 30 mg/dL — ABNORMAL HIGH
CALCIUM: 9.3 mg/dL
CHLORIDE: 108 mmol/L
CREATININE: 2.1 mg/dL — ABNORMAL HIGH
EGFR MDRD AF AMER: 37 mL/min/{1.73_m2} — ABNORMAL LOW
GLUCOSE RANDOM: 123 mg/dL — ABNORMAL HIGH
POTASSIUM: 5 mmol/L
SODIUM: 139 mmol/L

## 2018-01-22 LAB — CHLORIDE: Lab: 108

## 2018-01-22 LAB — PHOSPHORUS: Lab: 3

## 2018-01-22 LAB — TACROLIMUS, TROUGH: Lab: 10.2

## 2018-01-22 LAB — NEUTROPHILS RELATIVE PERCENT: Lab: 83

## 2018-01-22 LAB — MAGNESIUM: Lab: 1.7

## 2018-01-22 NOTE — Unmapped (Signed)
Spoke with lab supervisor at [facility].  Informed her that we only have labs dated 6/24 and 7/2.  Inquired why we were not getting lab results in a timely manner.  She reported that patient is going to a facility that is not connected to their electronic medical records system, thus labs have to be faxed manually.  Informed her that this delay is having a direct impact on patient care.  She stated that she will flag a reminder for herself to fax results as soon as they are available.  Meanwhile, she will fax all results that they currently have since June 24.  Also learned that Tac levels are being sent to [facility], thus there is at least a 2 day delay in getting results.  Requested a verbal on most recent creat which was done yesterday:  2.1.  Most recent FK level was 10.2, performed 7/9.  FK level from 7/16 is still pending.  Dr. Toni Arthurs informed of findings.

## 2018-01-23 LAB — CBC W/ DIFFERENTIAL
BASOPHILS ABSOLUTE COUNT: 0.1 10*9/L
BASOPHILS RELATIVE PERCENT: 1 %
EOSINOPHILS ABSOLUTE COUNT: 0.1 10*9/L
EOSINOPHILS RELATIVE PERCENT: 2 %
HEMATOCRIT: 46.3 %
HEMOGLOBIN: 15 g/dL
LYMPHOCYTES ABSOLUTE COUNT: 0.5 10*9/L — ABNORMAL LOW
LYMPHOCYTES RELATIVE PERCENT: 8 %
MEAN CORPUSCULAR HEMOGLOBIN CONC: 32.3 g/dL
MEAN CORPUSCULAR VOLUME: 95.1 fL
MONOCYTES ABSOLUTE COUNT: 0.6 10*9/L
MONOCYTES RELATIVE PERCENT: 9 %
NEUTROPHILS ABSOLUTE COUNT: 5.6 10*9/L
NEUTROPHILS RELATIVE PERCENT: 80 %
PLATELET COUNT: 208 10*9/L
RED BLOOD CELL COUNT: 4.87 10*12/L
RED CELL DISTRIBUTION WIDTH: 14.2 %
WBC ADJUSTED: 6.9 10*9/L

## 2018-01-23 LAB — BASIC METABOLIC PANEL
BLOOD UREA NITROGEN: 28 mg/dL — ABNORMAL HIGH
CALCIUM: 9.4 mg/dL
CO2: 24 mmol/L
CREATININE: 2.1 mg/dL — ABNORMAL HIGH
EGFR MDRD AF AMER: 37 mL/min/{1.73_m2} — ABNORMAL LOW
EGFR MDRD NON AF AMER: 32 mL/min/{1.73_m2} — ABNORMAL LOW
GLUCOSE RANDOM: 117 mg/dL — ABNORMAL HIGH
POTASSIUM: 4.6 mmol/L

## 2018-01-23 LAB — MAGNESIUM: Lab: 1.8

## 2018-01-23 LAB — SLIDE SCAN: Lab: 0

## 2018-01-23 LAB — PHOSPHORUS: Lab: 3

## 2018-01-23 LAB — POTASSIUM: Lab: 4.6

## 2018-01-23 NOTE — Unmapped (Signed)
Interpreted phone call. See telephone encounter for details.

## 2018-01-23 NOTE — Unmapped (Signed)
Second attempt to call pt regarding lab results and clinic appt.  No answer.  Interpreter services utilized.    Of note, Dr. Toni Arthurs clarified prograf dosing as follows:  Plan:  Assess if pt had reduced prograf dose to 4mg  in am and 3mg  in pm as previously instructed.  If he did, then instruct him to reduce to 3mg  in am and 2mg  in pm.  If he did not, then he should take 3mg  twice daily.  Also, Dr. Toni Arthurs would like to see pt in clinic on July 30.

## 2018-01-23 NOTE — Unmapped (Signed)
Recent lab results reviewed by Dr. Toni Arthurs.  Plan:  Assess if pt had reduced prograf dose to 4mg  in am and 3mg  in pm as previously instructed.  If he did, then instruct him to reduce to 3mg  in am and 2mg  in pm.  If he did not, then he should take 4mg  in am and 3mg  in pm as previously instructed.  Also, Dr. Toni Arthurs would like to see pt in clinic on July 30.    Attempted to call pt using interpreter services.  No answer.  Message was left for pt to return call.

## 2018-01-23 NOTE — Unmapped (Signed)
Assisted provider/staff in attempting to call patient. See telephone encounter for details.

## 2018-01-24 LAB — TACROLIMUS LEVEL: TACROLIMUS (FK506) - LABCORP: 14.7 ng/mL (ref 2.0–20.0)

## 2018-01-28 ENCOUNTER — Other Ambulatory Visit
Admission: RE | Admit: 2018-01-28 | Discharge: 2018-01-28 | Disposition: A | Discharge status: Home / Self Care | Payer: Medicare Other | Source: Ambulatory Visit | Attending: Nephrology | Admitting: Nephrology

## 2018-01-28 DIAGNOSIS — E1122 Type 2 diabetes mellitus with diabetic chronic kidney disease: Secondary | ICD-10-CM | POA: Diagnosis not present

## 2018-01-28 DIAGNOSIS — Z79899 Other long term (current) drug therapy: Secondary | ICD-10-CM | POA: Diagnosis not present

## 2018-01-28 DIAGNOSIS — Z9483 Pancreas transplant status: Secondary | ICD-10-CM | POA: Diagnosis not present

## 2018-01-28 DIAGNOSIS — D631 Anemia in chronic kidney disease: Secondary | ICD-10-CM | POA: Diagnosis not present

## 2018-01-28 DIAGNOSIS — B259 Cytomegaloviral disease, unspecified: Secondary | ICD-10-CM | POA: Insufficient documentation

## 2018-01-28 DIAGNOSIS — N390 Urinary tract infection, site not specified: Secondary | ICD-10-CM | POA: Diagnosis not present

## 2018-01-28 DIAGNOSIS — E559 Vitamin D deficiency, unspecified: Secondary | ICD-10-CM | POA: Diagnosis not present

## 2018-01-28 DIAGNOSIS — Z940 Kidney transplant status: Secondary | ICD-10-CM | POA: Insufficient documentation

## 2018-01-28 DIAGNOSIS — Z789 Other specified health status: Secondary | ICD-10-CM | POA: Diagnosis not present

## 2018-01-28 DIAGNOSIS — Z114 Encounter for screening for human immunodeficiency virus [HIV]: Secondary | ICD-10-CM | POA: Diagnosis not present

## 2018-01-28 DIAGNOSIS — E1129 Type 2 diabetes mellitus with other diabetic kidney complication: Secondary | ICD-10-CM | POA: Insufficient documentation

## 2018-01-28 DIAGNOSIS — D899 Disorder involving the immune mechanism, unspecified: Secondary | ICD-10-CM | POA: Diagnosis present

## 2018-01-28 LAB — CBC WITH DIFFERENTIAL/PLATELET
Basophils Absolute: 0 10*3/uL (ref 0–0.1)
Basophils Relative: 0 %
EOS ABS: 0.1 10*3/uL (ref 0–0.7)
EOS PCT: 1 %
HCT: 45.2 % (ref 40.0–52.0)
Hemoglobin: 14.9 g/dL (ref 13.0–18.0)
LYMPHS ABS: 0.4 10*3/uL — AB (ref 1.0–3.6)
Lymphocytes Relative: 5 %
MCH: 30.9 pg (ref 26.0–34.0)
MCHC: 33 g/dL (ref 32.0–36.0)
MCV: 93.7 fL (ref 80.0–100.0)
MONO ABS: 0.5 10*3/uL (ref 0.2–1.0)
MONOS PCT: 6 %
Neutro Abs: 6.6 10*3/uL — ABNORMAL HIGH (ref 1.4–6.5)
Neutrophils Relative %: 88 %
PLATELETS: 207 10*3/uL (ref 150–440)
RBC: 4.82 MIL/uL (ref 4.40–5.90)
RDW: 14.4 % (ref 11.5–14.5)
WBC: 7.6 10*3/uL (ref 3.8–10.6)

## 2018-01-28 LAB — BASIC METABOLIC PANEL
ANION GAP: 6 (ref 5–15)
BUN: 32 mg/dL — AB (ref 8–23)
CALCIUM: 9.3 mg/dL (ref 8.9–10.3)
CO2: 24 mmol/L (ref 22–32)
CREATININE: 1.97 mg/dL — AB (ref 0.61–1.24)
Chloride: 109 mmol/L (ref 98–111)
GFR calc Af Amer: 40 mL/min — ABNORMAL LOW (ref >60)
GFR, EST NON AFRICAN AMERICAN: 35 mL/min — AB (ref >60)
GLUCOSE: 127 mg/dL — AB (ref 70–99)
Potassium: 4.5 mmol/L (ref 3.5–5.1)
Sodium: 139 mmol/L (ref 135–145)

## 2018-01-28 LAB — PHOSPHORUS: Phosphorus: 2.9 mg/dL (ref 2.5–4.6)

## 2018-01-28 LAB — MAGNESIUM: Magnesium: 1.7 mg/dL (ref 1.7–2.4)

## 2018-01-28 NOTE — Unmapped (Signed)
[facility] Refill Coordination Note  Specialty Medication(s): PROGRAF 1 MG AND MYFORTIC  180MG                      Additional Medications shipped: SEPTRA    [facility], DOB: [birth year]  Phone: [phone] (home) , Alternate phone contact: N/A  Phone or address changes today?: No  All above HIPAA information was verified with patient.  Shipping Address: [address]  [place] [address]   Insurance changes? No    Completed refill call assessment today to schedule patient's medication shipment from the [facility] [phone]).      Confirmed the medication and dosage are correct and have not changed: Yes, regimen is correct and unchanged.    Confirmed patient started or stopped the following medications in the past month:  No, there are no changes reported at this time.    Are you tolerating your medication?:  Bryse reports tolerating the medication.    ADHERENCE    (Below is required for Medicare Part B or Transplant patients only - per drug):    Prograf 1 mg   Quantity filled last month: 240 TABLETS   # of tablets left on hand: 1 WEEK  Myfortic 180 mg   Quantity filled last month: 180   TABLETS   # of tablets left on hand: 1 WEEK            Did you miss any doses in the past 4 weeks? No missed doses reported.    FINANCIAL/SHIPPING    Delivery Scheduled: Yes, Expected medication delivery date: 01/31/18     The patient will receive an FSI print out for each medication shipped and additional FDA Medication Guides as required.  Patient education from [place] or [name] may also be included in the shipment    Nuh did not have any additional questions at this time.    Delivery address validated in FSI scheduling system: Yes, address listed in FSI is correct.    We will follow up with patient monthly for standard refill processing and delivery.      Thank you,  Unk Lightning   [facility] Shared [facility] Specialty Technician

## 2018-01-29 LAB — TACROLIMUS LEVEL: Tacrolimus (FK506) - LabCorp: 13.8 ng/mL (ref 2.0–20.0)

## 2018-01-30 LAB — BASIC METABOLIC PANEL
CO2: 24 mmol/L
EGFR MDRD AF AMER: 40 mL/min/{1.73_m2} — ABNORMAL LOW
EGFR MDRD NON AF AMER: 35 mL/min/{1.73_m2} — ABNORMAL LOW
GLUCOSE RANDOM: 127 mg/dL — ABNORMAL HIGH
POTASSIUM: 4.5 mmol/L
SODIUM: 139 mmol/L

## 2018-01-30 LAB — CBC W/ DIFFERENTIAL
BASOPHILS ABSOLUTE COUNT: 0 10*9/L
EOSINOPHILS ABSOLUTE COUNT: 1 10*9/L
EOSINOPHILS RELATIVE PERCENT: 1 %
HEMATOCRIT: 45.2 %
LYMPHOCYTES ABSOLUTE COUNT: 0 10*9/L — ABNORMAL LOW
LYMPHOCYTES RELATIVE PERCENT: 5 %
MEAN CORPUSCULAR HEMOGLOBIN CONC: 33 g/dL
MEAN CORPUSCULAR HEMOGLOBIN: 30.9 pg
MEAN CORPUSCULAR VOLUME: 93.7 fL
MONOCYTES ABSOLUTE COUNT: 0.5 10*9/L
MONOCYTES RELATIVE PERCENT: 6 %
NEUTROPHILS ABSOLUTE COUNT: 6.6 10*9/L — ABNORMAL HIGH
NEUTROPHILS RELATIVE PERCENT: 88 %
PLATELET COUNT: 207 10*9/L
RED BLOOD CELL COUNT: 4.82 10*12/L
RED CELL DISTRIBUTION WIDTH: 14.4 %
WHITE BLOOD CELL COUNT: 7.6 10*9/L

## 2018-01-30 LAB — PHOSPHORUS: Lab: 2.9

## 2018-01-30 LAB — MAGNESIUM: Lab: 1.7

## 2018-01-30 LAB — EGFR MDRD NON AF AMER: Lab: 35 — ABNORMAL LOW

## 2018-01-30 LAB — HEMATOCRIT: Lab: 45.2

## 2018-01-31 MED FILL — MYFORTIC/180MG/TAB: MYFORTIC/180MG/TAB | 30 days supply | Qty: 180 | Fill #4

## 2018-01-31 MED FILL — PROGRAF/1MG/CAP: PROGRAF/1MG/CAP | 30 days supply | Qty: 240 | Fill #3

## 2018-01-31 MED FILL — SULFAMETHOXAZOLE/TRIMETHO/400-80MG/TABS: SULFAMETHOXAZOLE/TRIMETHO/400-80MG/TABS | 28 days supply | Qty: 12 | Fill #2

## 2018-02-04 ENCOUNTER — Other Ambulatory Visit
Admission: RE | Admit: 2018-02-04 | Discharge: 2018-02-04 | Disposition: A | Discharge status: Home / Self Care | Payer: Medicare Other | Source: Ambulatory Visit | Attending: Nephrology | Admitting: Nephrology

## 2018-02-04 DIAGNOSIS — Z114 Encounter for screening for human immunodeficiency virus [HIV]: Secondary | ICD-10-CM | POA: Insufficient documentation

## 2018-02-04 DIAGNOSIS — Z09 Encounter for follow-up examination after completed treatment for conditions other than malignant neoplasm: Secondary | ICD-10-CM | POA: Diagnosis not present

## 2018-02-04 DIAGNOSIS — B269 Mumps without complication: Secondary | ICD-10-CM | POA: Diagnosis not present

## 2018-02-04 DIAGNOSIS — N189 Chronic kidney disease, unspecified: Secondary | ICD-10-CM | POA: Diagnosis not present

## 2018-02-04 DIAGNOSIS — E559 Vitamin D deficiency, unspecified: Secondary | ICD-10-CM | POA: Insufficient documentation

## 2018-02-04 DIAGNOSIS — E1129 Type 2 diabetes mellitus with other diabetic kidney complication: Secondary | ICD-10-CM | POA: Insufficient documentation

## 2018-02-04 DIAGNOSIS — Z789 Other specified health status: Secondary | ICD-10-CM | POA: Diagnosis not present

## 2018-02-04 DIAGNOSIS — D631 Anemia in chronic kidney disease: Secondary | ICD-10-CM | POA: Insufficient documentation

## 2018-02-04 DIAGNOSIS — Z79899 Other long term (current) drug therapy: Secondary | ICD-10-CM | POA: Diagnosis not present

## 2018-02-04 DIAGNOSIS — D899 Disorder involving the immune mechanism, unspecified: Secondary | ICD-10-CM | POA: Diagnosis present

## 2018-02-04 DIAGNOSIS — N390 Urinary tract infection, site not specified: Secondary | ICD-10-CM | POA: Diagnosis not present

## 2018-02-04 DIAGNOSIS — Z940 Kidney transplant status: Secondary | ICD-10-CM | POA: Diagnosis not present

## 2018-02-04 DIAGNOSIS — Z9483 Pancreas transplant status: Secondary | ICD-10-CM | POA: Diagnosis not present

## 2018-02-04 LAB — CBC WITH DIFFERENTIAL/PLATELET
BASOS ABS: 0 10*3/uL (ref 0–0.1)
Basophils Relative: 1 %
Eosinophils Absolute: 0.1 10*3/uL (ref 0–0.7)
Eosinophils Relative: 1 %
HCT: 46.5 % (ref 40.0–52.0)
Hemoglobin: 15.4 g/dL (ref 13.0–18.0)
LYMPHS PCT: 7 %
Lymphs Abs: 0.6 10*3/uL — ABNORMAL LOW (ref 1.0–3.6)
MCH: 31.1 pg (ref 26.0–34.0)
MCHC: 33.1 g/dL (ref 32.0–36.0)
MCV: 94 fL (ref 80.0–100.0)
MONO ABS: 0.5 10*3/uL (ref 0.2–1.0)
Monocytes Relative: 6 %
Neutro Abs: 6.8 10*3/uL — ABNORMAL HIGH (ref 1.4–6.5)
Neutrophils Relative %: 85 %
PLATELETS: 203 10*3/uL (ref 150–440)
RBC: 4.95 MIL/uL (ref 4.40–5.90)
RDW: 13.9 % (ref 11.5–14.5)
WBC: 8 10*3/uL (ref 3.8–10.6)

## 2018-02-04 LAB — BASIC METABOLIC PANEL
ANION GAP: 8 (ref 5–15)
BUN: 28 mg/dL — AB (ref 8–23)
CALCIUM: 9.4 mg/dL (ref 8.9–10.3)
CO2: 23 mmol/L (ref 22–32)
Chloride: 108 mmol/L (ref 98–111)
Creatinine, Ser: 2.04 mg/dL — ABNORMAL HIGH (ref 0.61–1.24)
GFR calc Af Amer: 39 mL/min — ABNORMAL LOW (ref >60)
GFR calc non Af Amer: 33 mL/min — ABNORMAL LOW (ref >60)
Glucose, Bld: 119 mg/dL — ABNORMAL HIGH (ref 70–99)
POTASSIUM: 4.7 mmol/L (ref 3.5–5.1)
Sodium: 139 mmol/L (ref 135–145)

## 2018-02-04 LAB — MAGNESIUM
Lab: 1.6 — ABNORMAL LOW
MAGNESIUM: 1.6 mg/dL — AB (ref 1.7–2.4)

## 2018-02-04 LAB — PHOSPHORUS: PHOSPHORUS: 2.7 mg/dL (ref 2.5–4.6)

## 2018-02-06 LAB — TACROLIMUS LEVEL: Tacrolimus (FK506) - LabCorp: 15 ng/mL (ref 2.0–20.0)

## 2018-02-06 NOTE — Unmapped (Signed)
Current Outpatient Medications on File Prior to Visit   Medication Sig   ??? amLODIPine (NORVASC) 10 MG tablet Take 0.5 tablets (5 mg total) by mouth every evening.   ??? MYFORTIC 180 mg EC tablet Take 3 tablets (540 mg total) by mouth Two (2) times a day.   ??? PROGRAF 1 mg capsule Take 4 caps in am, 3 caps in pm   ??? sulfamethoxazole-trimethoprim (BACTRIM,SEPTRA) 400-80 mg per tablet Take 1 tablet (80 mg of trimethoprim total) by mouth Every Monday, Wednesday, and Friday.     No current facility-administered medications on file prior to visit.

## 2018-02-07 LAB — CBC W/ DIFFERENTIAL
BASOPHILS RELATIVE PERCENT: 1 %
EOSINOPHILS ABSOLUTE COUNT: 0.1 10*9/L
EOSINOPHILS RELATIVE PERCENT: 1 %
HEMATOCRIT: 46.5 %
HEMOGLOBIN: 15.4 g/dL
LYMPHOCYTES ABSOLUTE COUNT: 0.6 10*9/L — ABNORMAL LOW
LYMPHOCYTES RELATIVE PERCENT: 7 %
MEAN CORPUSCULAR HEMOGLOBIN: 31.1 pg
MONOCYTES ABSOLUTE COUNT: 0.5 10*9/L
MONOCYTES RELATIVE PERCENT: 6 %
NEUTROPHILS ABSOLUTE COUNT: 6.8 10*9/L — ABNORMAL HIGH
NEUTROPHILS RELATIVE PERCENT: 85 %
PLATELET COUNT: 203 10*9/L
RED BLOOD CELL COUNT: 4.95 10*12/L
RED CELL DISTRIBUTION WIDTH: 13.9 %

## 2018-02-07 LAB — BASIC METABOLIC PANEL
CALCIUM: 9.4 mg/dL
CHLORIDE: 108 mmol/L
CO2: 23 mmol/L
CREATININE: 2.04 mg/dL — ABNORMAL HIGH
EGFR MDRD NON AF AMER: 33 mL/min/{1.73_m2} — ABNORMAL LOW
GLUCOSE RANDOM: 119 mg/dL — ABNORMAL HIGH
POTASSIUM: 4.7 mmol/L
SODIUM: 139 mmol/L

## 2018-02-07 LAB — MONOCYTES ABSOLUTE COUNT: Lab: 0.5

## 2018-02-07 LAB — PHOSPHORUS: Lab: 2.7

## 2018-02-07 LAB — EGFR MDRD AF AMER: Lab: 39 — ABNORMAL LOW

## 2018-02-11 ENCOUNTER — Other Ambulatory Visit
Admission: RE | Admit: 2018-02-11 | Discharge: 2018-02-11 | Disposition: A | Discharge status: Home / Self Care | Payer: Medicare Other | Source: Ambulatory Visit | Attending: Nephrology | Admitting: Nephrology

## 2018-02-11 DIAGNOSIS — Z114 Encounter for screening for human immunodeficiency virus [HIV]: Secondary | ICD-10-CM | POA: Insufficient documentation

## 2018-02-11 DIAGNOSIS — Z79899 Other long term (current) drug therapy: Secondary | ICD-10-CM | POA: Insufficient documentation

## 2018-02-11 DIAGNOSIS — D631 Anemia in chronic kidney disease: Secondary | ICD-10-CM | POA: Insufficient documentation

## 2018-02-11 DIAGNOSIS — N390 Urinary tract infection, site not specified: Secondary | ICD-10-CM | POA: Diagnosis not present

## 2018-02-11 DIAGNOSIS — E1129 Type 2 diabetes mellitus with other diabetic kidney complication: Secondary | ICD-10-CM | POA: Insufficient documentation

## 2018-02-11 DIAGNOSIS — Z09 Encounter for follow-up examination after completed treatment for conditions other than malignant neoplasm: Secondary | ICD-10-CM | POA: Diagnosis present

## 2018-02-11 DIAGNOSIS — E559 Vitamin D deficiency, unspecified: Secondary | ICD-10-CM | POA: Diagnosis not present

## 2018-02-11 DIAGNOSIS — Z789 Other specified health status: Secondary | ICD-10-CM | POA: Diagnosis not present

## 2018-02-11 DIAGNOSIS — Z9483 Pancreas transplant status: Secondary | ICD-10-CM | POA: Insufficient documentation

## 2018-02-11 DIAGNOSIS — B259 Cytomegaloviral disease, unspecified: Secondary | ICD-10-CM | POA: Insufficient documentation

## 2018-02-11 DIAGNOSIS — Z940 Kidney transplant status: Secondary | ICD-10-CM | POA: Diagnosis present

## 2018-02-11 LAB — BASIC METABOLIC PANEL
Anion gap: 8 (ref 5–15)
BUN: 24 mg/dL — AB (ref 8–23)
CO2: 24 mmol/L (ref 22–32)
CREATININE: 2.14 mg/dL — AB (ref 0.61–1.24)
Calcium: 9.2 mg/dL (ref 8.9–10.3)
Chloride: 106 mmol/L (ref 98–111)
GFR calc Af Amer: 37 mL/min — ABNORMAL LOW (ref >60)
GFR calc non Af Amer: 32 mL/min — ABNORMAL LOW (ref >60)
Glucose, Bld: 118 mg/dL — ABNORMAL HIGH (ref 70–99)
Potassium: 4.5 mmol/L (ref 3.5–5.1)
Sodium: 138 mmol/L (ref 135–145)

## 2018-02-11 LAB — CBC WITH DIFFERENTIAL/PLATELET
Basophils Absolute: 0.1 10*3/uL (ref 0–0.1)
Basophils Relative: 1 %
Eosinophils Absolute: 0.1 10*3/uL (ref 0–0.7)
Eosinophils Relative: 1 %
HEMATOCRIT: 48.4 % (ref 40.0–52.0)
Hemoglobin: 15.9 g/dL (ref 13.0–18.0)
LYMPHS ABS: 0.6 10*3/uL — AB (ref 1.0–3.6)
LYMPHS PCT: 7 %
MCH: 30.7 pg (ref 26.0–34.0)
MCHC: 32.9 g/dL (ref 32.0–36.0)
MCV: 93.4 fL (ref 80.0–100.0)
MONO ABS: 0.6 10*3/uL (ref 0.2–1.0)
MONOS PCT: 8 %
NEUTROS ABS: 6.6 10*3/uL — AB (ref 1.4–6.5)
Neutrophils Relative %: 83 %
Platelets: 202 10*3/uL (ref 150–440)
RBC: 5.18 MIL/uL (ref 4.40–5.90)
RDW: 14.3 % (ref 11.5–14.5)
WBC: 8 10*3/uL (ref 3.8–10.6)

## 2018-02-11 LAB — PHOSPHORUS: Phosphorus: 2.8 mg/dL (ref 2.5–4.6)

## 2018-02-11 LAB — MAGNESIUM: Magnesium: 1.9 mg/dL (ref 1.7–2.4)

## 2018-02-12 LAB — TACROLIMUS, TROUGH: Lab: 15

## 2018-02-12 MED ORDER — PROGRAF 1 MG CAPSULE: capsule | 11 refills | 0 days

## 2018-02-12 MED ORDER — PROGRAF 1 MG CAPSULE
ORAL_CAPSULE | Freq: Two times a day (BID) | ORAL | 11 refills | 0.00000 days | Status: CP
Start: 2018-02-12 — End: 2018-02-25

## 2018-02-12 NOTE — Unmapped (Signed)
Epic [facility]) medication reconciliation is completed.

## 2018-02-13 LAB — TACROLIMUS LEVEL: TACROLIMUS (FK506) - LABCORP: 14.7 ng/mL (ref 2.0–20.0)

## 2018-02-13 LAB — CBC W/ DIFFERENTIAL
BASOPHILS ABSOLUTE COUNT: 0.1 10*9/L
BASOPHILS RELATIVE PERCENT: 1 %
EOSINOPHILS ABSOLUTE COUNT: 0.1 10*9/L
EOSINOPHILS RELATIVE PERCENT: 1 %
LYMPHOCYTES ABSOLUTE COUNT: 0.6 10*9/L — ABNORMAL LOW
LYMPHOCYTES RELATIVE PERCENT: 7 %
MEAN CORPUSCULAR HEMOGLOBIN CONC: 32.9 g/dL
MEAN CORPUSCULAR HEMOGLOBIN: 30.7 pg
MEAN CORPUSCULAR VOLUME: 93.4 fL
MONOCYTES ABSOLUTE COUNT: 0.6 10*9/L
MONOCYTES RELATIVE PERCENT: 8 %
NEUTROPHILS ABSOLUTE COUNT: 6.6 10*9/L — ABNORMAL HIGH
NEUTROPHILS RELATIVE PERCENT: 83 %
PLATELET COUNT: 202 10*9/L
RED BLOOD CELL COUNT: 5.18 10*12/L
RED CELL DISTRIBUTION WIDTH: 14.3 %
WBC ADJUSTED: 8 10*9/L

## 2018-02-13 LAB — COMPREHENSIVE METABOLIC PANEL
BLOOD UREA NITROGEN: 24 mg/dL — ABNORMAL HIGH
CALCIUM: 9.2 mg/dL
CO2: 24 mmol/L
CREATININE: 2.14 mg/dL — ABNORMAL HIGH
EGFR MDRD NON AF AMER: 32 mL/min/{1.73_m2} — ABNORMAL LOW
GLUCOSE RANDOM: 118 mg/dL — ABNORMAL HIGH
POTASSIUM: 4.5 mmol/L
SODIUM: 138 mmol/L

## 2018-02-13 LAB — CREATININE: Lab: 2.14 — ABNORMAL HIGH

## 2018-02-13 LAB — HYPERCHROMASIA: Lab: 0

## 2018-02-13 LAB — TACROLIMUS, TROUGH: Lab: 15

## 2018-02-13 LAB — MAGNESIUM
Lab: 1.9
MAGNESIUM: 1.9 mg/dL

## 2018-02-13 LAB — PHOSPHORUS: Lab: 2.8

## 2018-02-13 NOTE — Unmapped (Signed)
Patient via pacific spanish interpreter. Prograf level 15.0; Pt advised to hold prograf dose tonight and  decrease prograf  Dose to 2mg  BID per Justin Mcknight PharmD

## 2018-02-17 NOTE — Unmapped (Signed)
Per Dr. Alveda Reasons request: have patient get labs at a [facility] facility.  Called patient with spanish interpretor:    Asked patient if he could get his labs drawn at [facility] tomorrow instead of going to [facility] due to delay in getting lab results.  Patient states he will go to [facility] tomorrow morning to get labs drawn. Verbalized understanding to hold morning medications until after his labs are drawn.    Patient reports that he takes his prograf at 9pm

## 2018-02-17 NOTE — Unmapped (Signed)
See the Language Assistant Navigator for documentation.  Justin Mcknight

## 2018-02-18 ENCOUNTER — Ambulatory Visit: Admit: 2018-02-18 | Discharge: 2018-02-19 | Payer: MEDICARE

## 2018-02-18 DIAGNOSIS — Z940 Kidney transplant status: Secondary | ICD-10-CM

## 2018-02-18 DIAGNOSIS — Z79899 Other long term (current) drug therapy: Principal | ICD-10-CM

## 2018-02-18 LAB — CBC W/ AUTO DIFF
BASOPHILS ABSOLUTE COUNT: 0.1 10*9/L (ref 0.0–0.1)
BASOPHILS RELATIVE PERCENT: 1.1 %
EOSINOPHILS ABSOLUTE COUNT: 0.1 10*9/L (ref 0.0–0.4)
EOSINOPHILS RELATIVE PERCENT: 1.7 %
HEMATOCRIT: 50.8 % (ref 41.0–53.0)
HEMOGLOBIN: 16 g/dL (ref 13.5–17.5)
LARGE UNSTAINED CELLS: 3 % (ref 0–4)
LYMPHOCYTES ABSOLUTE COUNT: 0.6 10*9/L — ABNORMAL LOW (ref 1.5–5.0)
LYMPHOCYTES RELATIVE PERCENT: 9.7 %
MEAN CORPUSCULAR HEMOGLOBIN CONC: 31.4 g/dL (ref 31.0–37.0)
MEAN CORPUSCULAR VOLUME: 94.6 fL (ref 80.0–100.0)
MEAN PLATELET VOLUME: 8 fL (ref 7.0–10.0)
MONOCYTES ABSOLUTE COUNT: 0.3 10*9/L (ref 0.2–0.8)
MONOCYTES RELATIVE PERCENT: 4.4 %
NEUTROPHILS ABSOLUTE COUNT: 5.1 10*9/L (ref 2.0–7.5)
NEUTROPHILS RELATIVE PERCENT: 80.1 %
PLATELET COUNT: 210 10*9/L (ref 150–440)
RED BLOOD CELL COUNT: 5.37 10*12/L (ref 4.50–5.90)
RED CELL DISTRIBUTION WIDTH: 14.7 % (ref 12.0–15.0)
WBC ADJUSTED: 6.3 10*9/L (ref 4.5–11.0)

## 2018-02-18 LAB — RENAL FUNCTION PANEL
ANION GAP: 12 mmol/L (ref 9–15)
BLOOD UREA NITROGEN: 27 mg/dL — ABNORMAL HIGH (ref 7–21)
BUN / CREAT RATIO: 14
CALCIUM: 9.9 mg/dL (ref 8.5–10.2)
CHLORIDE: 107 mmol/L (ref 98–107)
CO2: 19 mmol/L — ABNORMAL LOW (ref 22.0–30.0)
CREATININE: 1.89 mg/dL — ABNORMAL HIGH (ref 0.70–1.30)
EGFR CKD-EPI AA MALE: 43 mL/min/{1.73_m2} — ABNORMAL LOW (ref >=60)
EGFR CKD-EPI NON-AA MALE: 37 mL/min/{1.73_m2} — ABNORMAL LOW (ref >=60)
GLUCOSE RANDOM: 140 mg/dL (ref 65–179)
PHOSPHORUS: 2.7 mg/dL — ABNORMAL LOW (ref 2.9–4.7)
POTASSIUM: 4.4 mmol/L (ref 3.5–5.0)
SODIUM: 138 mmol/L (ref 135–145)

## 2018-02-18 LAB — MEAN CORPUSCULAR VOLUME: Lab: 94.6

## 2018-02-18 LAB — TACROLIMUS, TROUGH: Lab: 13

## 2018-02-18 LAB — MAGNESIUM: Magnesium:MCnc:Pt:Ser/Plas:Qn:: 1.5 — ABNORMAL LOW

## 2018-02-18 LAB — EGFR CKD-EPI AA MALE: Lab: 43 — ABNORMAL LOW

## 2018-02-18 NOTE — Unmapped (Signed)
See the Language Assistant Navigator for documentation.  Justin Mcknight

## 2018-02-18 NOTE — Unmapped (Signed)
Talked with patient with the use of hospital interpretor.      Called patient to follow on lab results drawn this morning. Patient states he held his prograf this morning and last took the medicine at 9pm yesterday.  Patient states he is taking 4 capsules BID not 2mg  as prescribed last week.  Patient verbalized that he is to start taking 2mg  tonight and then 2mg  BID of the prograf. Explained to patient  That his levels are too high and we need him to take the lower dose.  Patient states he will continue to get his labs drawn at [facility] and will go back next Tuesday to get labs.    Patient denies any other needs at this time

## 2018-02-25 ENCOUNTER — Institutional Professional Consult (permissible substitution)
Admit: 2018-02-25 | Discharge: 2018-02-25 | Payer: MEDICARE | Attending: Pharmacist Clinician (PhC)/ Clinical Pharmacy Specialist | Primary: Pharmacist Clinician (PhC)/ Clinical Pharmacy Specialist

## 2018-02-25 ENCOUNTER — Ambulatory Visit: Admit: 2018-02-25 | Discharge: 2018-02-25 | Payer: MEDICARE

## 2018-02-25 ENCOUNTER — Ambulatory Visit: Admit: 2018-02-25 | Discharge: 2018-02-25 | Payer: MEDICARE | Attending: Nephrology | Primary: Nephrology

## 2018-02-25 DIAGNOSIS — D899 Disorder involving the immune mechanism, unspecified: Secondary | ICD-10-CM

## 2018-02-25 DIAGNOSIS — Z940 Kidney transplant status: Principal | ICD-10-CM

## 2018-02-25 DIAGNOSIS — Z0000 Encounter for general adult medical examination without abnormal findings: Principal | ICD-10-CM

## 2018-02-25 DIAGNOSIS — Z79899 Other long term (current) drug therapy: Secondary | ICD-10-CM

## 2018-02-25 LAB — COMPREHENSIVE METABOLIC PANEL
ALBUMIN: 4.5 g/dL (ref 3.5–5.0)
ALKALINE PHOSPHATASE: 76 U/L (ref 38–126)
ALT (SGPT): 51 U/L (ref 19–72)
ANION GAP: 7 mmol/L — ABNORMAL LOW (ref 9–15)
AST (SGOT): 45 U/L (ref 19–55)
BILIRUBIN TOTAL: 0.3 mg/dL (ref 0.0–1.2)
BLOOD UREA NITROGEN: 23 mg/dL — ABNORMAL HIGH (ref 7–21)
BUN / CREAT RATIO: 13
CALCIUM: 10 mg/dL (ref 8.5–10.2)
CHLORIDE: 104 mmol/L (ref 98–107)
CO2: 26 mmol/L (ref 22.0–30.0)
CREATININE: 1.82 mg/dL — ABNORMAL HIGH (ref 0.70–1.30)
EGFR CKD-EPI AA MALE: 45 mL/min/{1.73_m2} — ABNORMAL LOW (ref >=60)
EGFR CKD-EPI NON-AA MALE: 39 mL/min/{1.73_m2} — ABNORMAL LOW (ref >=60)
POTASSIUM: 4.5 mmol/L (ref 3.5–5.0)
PROTEIN TOTAL: 7.4 g/dL (ref 6.5–8.3)
SODIUM: 137 mmol/L (ref 135–145)

## 2018-02-25 LAB — CBC W/ AUTO DIFF
BASOPHILS ABSOLUTE COUNT: 0.1 10*9/L (ref 0.0–0.1)
BASOPHILS RELATIVE PERCENT: 1 %
EOSINOPHILS ABSOLUTE COUNT: 0.1 10*9/L (ref 0.0–0.4)
EOSINOPHILS RELATIVE PERCENT: 2.4 %
HEMATOCRIT: 51.3 % (ref 41.0–53.0)
HEMOGLOBIN: 16.1 g/dL (ref 13.5–17.5)
LARGE UNSTAINED CELLS: 2 % (ref 0–4)
LYMPHOCYTES ABSOLUTE COUNT: 0.5 10*9/L — ABNORMAL LOW (ref 1.5–5.0)
MEAN CORPUSCULAR HEMOGLOBIN CONC: 31.4 g/dL (ref 31.0–37.0)
MEAN CORPUSCULAR HEMOGLOBIN: 30.3 pg (ref 26.0–34.0)
MEAN PLATELET VOLUME: 8.5 fL (ref 7.0–10.0)
MONOCYTES ABSOLUTE COUNT: 0.6 10*9/L (ref 0.2–0.8)
MONOCYTES RELATIVE PERCENT: 10 %
NEUTROPHILS ABSOLUTE COUNT: 4.3 10*9/L (ref 2.0–7.5)
NEUTROPHILS RELATIVE PERCENT: 75.1 %
PLATELET COUNT: 229 10*9/L (ref 150–440)
RED BLOOD CELL COUNT: 5.32 10*12/L (ref 4.50–5.90)
RED CELL DISTRIBUTION WIDTH: 14 % (ref 12.0–15.0)
WBC ADJUSTED: 5.7 10*9/L (ref 4.5–11.0)

## 2018-02-25 LAB — URINALYSIS
BILIRUBIN UA: NEGATIVE
GLUCOSE UA: NEGATIVE
KETONES UA: NEGATIVE
LEUKOCYTE ESTERASE UA: NEGATIVE
NITRITE UA: NEGATIVE
PH UA: 5.5 (ref 5.0–9.0)
PROTEIN UA: 30 — AB
RBC UA: 3 /HPF (ref <=3)
SPECIFIC GRAVITY UA: 1.016 (ref 1.003–1.030)
SQUAMOUS EPITHELIAL: <1 /HPF (ref 0–5)
UROBILINOGEN UA: 0.2
WBC UA: 1 /HPF (ref <=2)

## 2018-02-25 LAB — AST (SGOT): Aspartate aminotransferase:CCnc:Pt:Ser/Plas:Qn:: 45

## 2018-02-25 LAB — CREATININE, URINE: Lab: 108.2

## 2018-02-25 LAB — PROTEIN / CREATININE RATIO, URINE: CREATININE, URINE: 108.2 mg/dL

## 2018-02-25 LAB — SPERM

## 2018-02-25 LAB — MEAN CORPUSCULAR HEMOGLOBIN CONC: Lab: 31.4

## 2018-02-25 LAB — SMEAR REVIEW: Lab: 0

## 2018-02-25 LAB — TACROLIMUS, TROUGH: Lab: 4.4 — ABNORMAL LOW

## 2018-02-25 LAB — MAGNESIUM: Magnesium:MCnc:Pt:Ser/Plas:Qn:: 1.7

## 2018-02-25 LAB — PHOSPHORUS: Phosphate:MCnc:Pt:Ser/Plas:Qn:: 2.8 — ABNORMAL LOW

## 2018-02-25 MED ORDER — AMLODIPINE 5 MG TABLET
ORAL_TABLET | Freq: Every evening | ORAL | 11 refills | 0.00000 days | Status: CP
Start: 2018-02-25 — End: 2018-05-28
  Filled 2018-03-05: qty 30, 30d supply, fill #0

## 2018-02-25 MED ORDER — PROGRAF 1 MG CAPSULE
ORAL_CAPSULE | Freq: Two times a day (BID) | ORAL | 11 refills | 0 days | Status: CP
Start: 2018-02-25 — End: 2018-05-29
  Filled 2018-03-05: qty 180, 30d supply, fill #0

## 2018-02-25 MED ORDER — ATORVASTATIN 10 MG TABLET
ORAL_TABLET | Freq: Every day | ORAL | 11 refills | 30.00000 days | Status: CP
Start: 2018-02-25 — End: 2019-02-25
  Filled 2018-03-05: qty 30, 30d supply, fill #0

## 2018-02-25 NOTE — Unmapped (Signed)
Saw patient in clinic today. States he is doing well.  Reports some left sided throat pain for the past 8 days with swallowing but it is getting better. NO fevers. No CP/SOB, no tremors, and no urinary symptoms (burning or frequency).    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 Sent today

## 2018-02-25 NOTE — Unmapped (Signed)
PROGRAF DOSE INCREASE   TEST CLAIM $0.00

## 2018-02-25 NOTE — Unmapped (Signed)
[facility]IT[facility] TRANSPLANT CLINIC PHARMACY NOTE  02/25/2018   Ettore Trebilcock [facility]  [id number]    Medication changes today:   1. Start atorvastatin 10mg  once daily  2. Continue prograf 2mg  twice daily (see below)  3. Remove docusate and miralax from med list    Education/Adherence tools provided today:  1.provided updated medication list  2.  provided additional education on immunosuppression and transplant related medications including reviewing indications of medications, dosing and side effects    Follow up items:  1. goal of understanding indications and dosing of immunosuppression medications  2. Consider starting Vitamin D supplementation  3. Pill box for accuracy    Next visit with pharmacy in 1-3 months  ____________________________________________________________________    Justin Mcknight is a 62 y.o. 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     Other PMH significant for hypertension     Readmitted 3/7-09/13/17 post renal biopsy 2/2 acute hemoglobin drop and consider for bleeding.  Renal u/s showed perinephric hematomas.     Seen by pharmacy today for: medication management and pill box fill and adherence education; last seen by pharmacy 2 months ago.    CC:  Patient has no complaints today    Vitals:    02/25/18 0942   BP: 112/68   Pulse: 77   Temp: 36.7 ??C (98.1 ??F)       Allergies   Allergen Reactions   ??? Codeine Swelling     All medications reviewed and updated.     Medication list includes revisions made during today???s encounter    Outpatient Encounter Medications as of 02/25/2018   Medication Sig Dispense Refill   ??? MYFORTIC 180 mg EC tablet TOME 3 TABLETAS VIA ORAL DOS VECES AL DIA ( TAKE 3 TABLETS BY MOUTH TWICE DAILY ) 180 tablet 11   ??? PROGRAF 1 mg capsule TAKE 2 CAPSULES (2MG ) BY MOUTH TWICE DAILY ( TOME 2 CAPSULAS VIA ORAL DOS VECES AL DIA ) 240 capsule 11   ??? sulfamethoxazole-trimethoprim (BACTRIM,SEPTRA) 400-80 mg per tablet TAKE 1 TABLET BY MOUTH EVERY MONDAY WEDNESDAY AND FRIDAY ( TOME 1 TABLETA VIA ORAL CADA LUNES MIERCOLES Y VIERNES 12 tablet 0   ??? [DISCONTINUED] amLODIPine (NORVASC) 10 MG tablet Take 0.5 tablets (5 mg total) by mouth every evening. 90 tablet 4   ??? [DISCONTINUED] aspirin (ECOTRIN) 81 MG tablet Take 1 tablet (81 mg total) by mouth daily. HOLD until directed to start by your coordinator. 30 tablet 11   ??? [DISCONTINUED] docusate sodium (COLACE) 100 MG capsule TAKE 1 CAPSULE BY MOUTH TWICE DAILY AS NEEDED FOR CONSTIPATION. ( TOME 1 CAPSULA VIA ORAL DOS VECES AL DIA SEGUN NECESITE PARA EL ESTRENIMIENTO ) (Patient not taking: Reported on 02/25/2018) 60 capsule 0   ??? [DISCONTINUED] famotidine (PEPCID) 20 MG tablet Take 1 tablet (20 mg total) by mouth daily. (Patient not taking: Reported on 12/04/2017) 90 tablet 3   ??? [DISCONTINUED] gabapentin (NEURONTIN) 100 MG capsule Take 1 capsule (100 mg total) by mouth nightly. for 12 days 12 capsule 0   ??? [DISCONTINUED] polyethylene glycol (GLYCOLAX) 17 gram/dose powder MIX 17 GRAMS (USE MEASURE LINE IN CAP) IN 4-8 OUNCES OF WATER, JUICE, SODA, COFFEE, OR TEA AND DRINK ONCE DAILY ( COMBINE 17 GRAMOS (UTILICE LA LINEA DE MEDIDA EN LA TAPA) AGUA, JUGO, GASEOSA, CAFE O TE Y BEBA UNA VEZ AL DIA SEGUN NECESITE ) (Patient not taking: Reported on 02/25/2018) 510 g 0   ??? [DISCONTINUED] traMADol (ULTRAM) 50 mg  tablet TAKE 1-2 TABLETS (50-100 MG TOTAL) BY MOUTH EVERY 12 HOURS AS NEEDED FOR PAIN (TOME 1-2 TABLETAS VIA ORAL CADA 12 HORAS SEGUN NECESITE PARA EL DOLOR ) (Patient not taking: Reported on 02/25/2018) 30 each 0     No facility-administered encounter medications on file as of 02/25/2018.      Induction agent : alemtuzumab    CURRENT IMMUNOSUPPRESSION: tacrolimus 2 mg PO BID  prograf/cyclosporine goal: 6-8   myfortic540  mg PO bid    steroid free     Patient was previously getting his tac level drawn at [facility] in [place] - had several supratherapeutic levels from June and July 2019 and was instructed by the coordinator to reduce his tacrolims dose during those times via voicemail. Coordinator called last week again to verify his tac dose d/t consistently high levels and pt reported still being on 4mg  twice daily since 10/2017 and never changed his dose.     With interpreter, coordinator changed dose to 2 mg bid. Pt has been on 2 mg bid since 8/13. We interviewed the patient today to find out if there is any barrier we can help resolve. 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 Confirmed with the patient that he has started taking tac 2mg  twice daily since last week. We also communicated this to the coordinator. We will try to avoid calling the patient before 3PM from now on.   Pt also reports that he is responsive to text messages and that is how he knew of his appt today.     Patient is tolerating immunosuppression well - reported that he had increased frequency of tremors back in July, but that has gone away always completely since he decreased his tacro dose.     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  WBC/ANC:  5.7/4.3    Plan: Continue tacro 2mg  twice daily - pending level from today. Continue to monitor.    ID prophylaxis:   CMV Status: D+/ R+, moderate risk . CMV prophylaxis: Valcyte 450 mg Mon/Thu x 3 months per protocol completed  No results found for: CMVCP  PCP Prophylaxis: Received pentamidine dose x1 on 3/1/9 prior to hospital discharge and was transitioned back to Bactrim post hospital DC x 6 months.  Thrush: not completed inpatient 2/2 nystatin shortage  Patient is  tolerating infectious prophylaxis well    Plan: Scheduled to stop bactrim on 03/03/2018 per protocol. Continue to monitor.    CV Prophylaxis: Aspirin 81 mg held for thrombocytopenia on discharge from transplant and further held for hematoma post kidney biopsy  The 10-year ASCVD risk score Denman George DC Jr., et al., 2013) is: 6.8%   Statin therapy: Indicated; currently on no statin  Plan: Start atorvastatin 10mg  once daily. Continue to monitor    BP: Goal < 140/90. Clinic vitals reported above  Home BP ranges: checks ~twice weekly and BP is always <120/85  Current meds include: amlodipine 5 mg daily - pt reports using 10 mg tablets and breaking them with his teeth which led to different sized halves.   Plan: Will send new rx for 5 mg tablet    Anemia:  H/H:   Lab Results   Component Value Date    HGB 16.0 02/18/2018     Lab Results   Component Value Date    HCT 50.8 02/18/2018     Iron panel:  Lab Results   Component Value Date    IRON 35 11/03/2012    TIBC 261 11/03/2012    FERRITIN 860 (H) 11/03/2012     Lab Results   Component Value Date    LABIRON 13 (L) 11/03/2012     Prior ESA use: none post transplant    Plan: stable. Continue to monitor.     DM:   Lab Results   Component Value Date    A1C 5.1 09/03/2017   . Goal A1c < 7  History of Dm? No  Established with endocrinologist/PCP for BG managment? No   Fasting BG: have been in in 120s-130s.    Diet: Eating well per patient.   Hypoglycemia: no  Plan: Continue to monitor    Electrolytes: wnl   Meds currently on: none  Plan:  Continue to monitor     GI/BM: pt reports 1-2BM daily.    Meds currently on: none (pt stopped taking docusate and miralax)  Plan: Remove docusate and miralax from med list. Continue to monitor    Pain: pt reports no pain   Meds currently on: none  Plan: Continue to monitor    Bone health:   Vitamin D Level: 26.6 on 12/04/17. Goal > 30.   Last DEXA results:  none available  Current meds include: none -   Plan: Will hold off on starting vitamin D this time as vitamin D was slightly below goal and has had trouble with following medications in the past. Re-assess need for vitamin D at next visit. Continue to monitor.     Women's/Men's Health:  Osama Coleson is a 62 y.o. male. Patient reports no men's/women's health issues  Plan: Continue to monitor    Adherence: Patient has average understanding of medications; was able to independently identify names/doses of immunosuppressants and OI meds.  Patient  does fill their own pill box on a regular basis at home.    Patient brought medication card:yes   Did not bring pill box  Plan: Provided moderate adherence counseling/intervention    Spent approximately 30 minutes on educating this patient and greater than 50% was spent in direct face to face counseling regarding post transplant medication education. Questions and concerns were address to patient's satisfaction.    Patient was reviewed with Dr. Toni Arthurs who was agreement with the stated plan:     During this visit, the following was completed:   BG log data assessment  BP log data assessment  Labs ordered and evaluated  complex treatment plan >1 DS   Patient education was completed for 25-60 minutes     All questions/concerns were addressed to the patient's satisfaction.  __________________________________________  Lucy Chris, PharmD  PGY-1 Pharmacy Resident  Pager [phone]    PATIENT SEEN AND EVALUATED BY:   Noah Charon, PHARMD, CPP  [employer] TRANSPLANT  PAGER [phone]

## 2018-02-26 LAB — CMV DNA, QUANTITATIVE, PCR
CMV QUANT: <50 [IU]/mL — ABNORMAL HIGH (ref <0)
CMV VIRAL LD: DETECTED — AB

## 2018-02-26 LAB — CMV COMMENT: Lab: 0

## 2018-02-26 NOTE — Unmapped (Signed)
Called patient to update him on tac level from blood work yesterday.    Per Dr. Toni Arthurs change prograf to 3mg  BID.    Called patient with use of [employer] Samuel Bouche [phone].  Patient verbalized understanding of dose change and to take 3mg  BID.    Patient will have blood work next week

## 2018-02-27 NOTE — Unmapped (Signed)
[facility] transplant nephrology clinic visit    assessment and plan  1. s/p kidney transplant 09/03/2017. baseline creatinine +/-2 mg/dl. +proteinuria < 1gm/d. no donor specific hla ab detected. tacrolimus incr to 3mg  bid re: 12hr lvl 6-8 ng/ml.   2. hypertension. blood pressure goal < 130/80 mmhg.  3. hyperlipidemia. +atorvastatin 10mg  daily.  4. av fistula/+aneurysm. [place] transplant surgery referral for av fistula ligation.  5. preventive medicine. influenza '18. ppsv23 pneumococcal '12. pcv13 pneumococcal and zoster vaccination recommended at 1 year post transplant. trimethoprim/sulfamethoxazole 80/400mg  3x/wk x60m 2-8/19 now completed.    history of present illness    Justin Mcknight is a 62 year old gentleman seen in follow up s/p kidney transplant 09/03/2017. post transplant course notable for +mild focal thrombotic microangiopathy seen on zero hour kidney bx and on follow up x2wks post transplant which had resolved on follow up kidney bx 05/19.    Justin Mcknight is seen with assistance of [employer], structural. he feels well. no fevers chills or sweats. no chest pain palpitations or shortness of breath. no lower ext edema. no headache or lightheaded. appetite nl. no abdominal pain. no n/v/d. no dysuria hematuria or difficulty voiding. tremor improved with tacrolimus dose reduction. all other systems reviewed and negative x10 systems.    past medical hx:  1. 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 hypertension. alemtuzumab induction. baseline creatinine +/-2 mg/dl.  > kidney bx 03/19: no acute rejection. +mild focal thrombotic microangiopathy.  > kidney bx 05/19: no acute rejection. no thrombotic microangiopathy.  2. hypertension  3. hx renal cell carcinoma s/p right nephrectomy '12.    past surgical hx: left forearm av fistula '12. right nephrectomy '12. kidney transplant '19.    allergies: codeine    medications: tacrolimus 2mg  bid, mycophenolate sodium 540mg  bid, trimethoprim/sulfamethoxazole 80/400mg  3x/wk, amlodipine 5mg  daily.    soc hx: married x7 children. works full Advertising account planner. no smoking hx.    physical exam: t98.1 p77 bp112/68 wt84.6kg bmi 24. wd/wn gentleman nl/appropriate affect and mood. nl sclera anicteric. mmm no thrush. neck supple no palpable ln. heart rrr nl s1s2 no m/r/g. lungs clear bilateral. abd soft nt/nd. no lower ext edema. msk no synovitis/tophi. skin no rash. neuro alert oriented non focal exam.    labs 02/25/18: wbc5.7 hgb16.1 hct51.3 plts229. RU045 k4.5 cl104 bicarb26 bun23 cr1.8 glc116 ca10 mg1.7 phos2.8 albumin4.5. liver function panel nl. cmv pcr < 50. tacrolimus 12hr lvl 4.4 ng/ml. urine protein/cr 0.593.

## 2018-03-04 ENCOUNTER — Ambulatory Visit: Admit: 2018-03-04 | Discharge: 2018-03-05 | Payer: MEDICARE

## 2018-03-04 DIAGNOSIS — Z79899 Other long term (current) drug therapy: Secondary | ICD-10-CM

## 2018-03-04 DIAGNOSIS — Z940 Kidney transplant status: Principal | ICD-10-CM

## 2018-03-04 LAB — MANUAL DIFFERENTIAL
LYMPHOCYTES - ABS (DIFF): 1.2 10*9/L — ABNORMAL LOW (ref 1.5–5.0)
LYMPHOCYTES - REL (DIFF): 16 %
MONOCYTES - ABS (DIFF): 0.6 10*9/L (ref 0.2–0.8)
MONOCYTES - REL (DIFF): 9 %
NEUTROPHILS - ABS (DIFF): 5.4 10*9/L (ref 2.0–7.5)
NEUTROPHILS - REL (DIFF): 75 %

## 2018-03-04 LAB — TACROLIMUS, TROUGH: Lab: 7.5

## 2018-03-04 LAB — CBC W/ AUTO DIFF
HEMATOCRIT: 50.7 % (ref 41.0–53.0)
HEMOGLOBIN: 16 g/dL (ref 13.5–17.5)
MEAN CORPUSCULAR HEMOGLOBIN CONC: 31.6 g/dL (ref 31.0–37.0)
MEAN CORPUSCULAR HEMOGLOBIN: 29.8 pg (ref 26.0–34.0)
MEAN CORPUSCULAR VOLUME: 94.1 fL (ref 80.0–100.0)
MEAN PLATELET VOLUME: 8.3 fL (ref 7.0–10.0)
RED BLOOD CELL COUNT: 5.39 10*12/L (ref 4.50–5.90)
RED CELL DISTRIBUTION WIDTH: 14.8 % (ref 12.0–15.0)
WBC ADJUSTED: 7.2 10*9/L (ref 4.5–11.0)

## 2018-03-04 LAB — COMPREHENSIVE METABOLIC PANEL
ALBUMIN: 4.6 g/dL (ref 3.5–5.0)
ALKALINE PHOSPHATASE: 81 U/L (ref 38–126)
ALT (SGPT): 72 U/L (ref 19–72)
AST (SGOT): 59 U/L — ABNORMAL HIGH (ref 19–55)
BILIRUBIN TOTAL: 0.5 mg/dL (ref 0.0–1.2)
BUN / CREAT RATIO: 14
CALCIUM: 10 mg/dL (ref 8.5–10.2)
CHLORIDE: 104 mmol/L (ref 98–107)
CO2: 24 mmol/L (ref 22.0–30.0)
CREATININE: 1.8 mg/dL — ABNORMAL HIGH (ref 0.70–1.30)
EGFR CKD-EPI AA MALE: 46 mL/min/{1.73_m2} — ABNORMAL LOW (ref >=60)
EGFR CKD-EPI NON-AA MALE: 40 mL/min/{1.73_m2} — ABNORMAL LOW (ref >=60)
GLUCOSE RANDOM: 128 mg/dL (ref 65–179)
POTASSIUM: 4.7 mmol/L (ref 3.5–5.0)
PROTEIN TOTAL: 7.3 g/dL (ref 6.5–8.3)
SODIUM: 140 mmol/L (ref 135–145)

## 2018-03-04 LAB — BASOPHILS - ABS (DIFF): Lab: 0

## 2018-03-04 LAB — PROTEIN / CREATININE RATIO, URINE: CREATININE, URINE: 63.4 mg/dL

## 2018-03-04 LAB — PHOSPHORUS
PHOSPHORUS: 2.9 mg/dL (ref 2.9–4.7)
Phosphate:MCnc:Pt:Ser/Plas:Qn:: 2.9

## 2018-03-04 LAB — PROTEIN TOTAL: Protein:MCnc:Pt:Ser/Plas:Qn:: 7.3

## 2018-03-04 LAB — MAGNESIUM: Magnesium:MCnc:Pt:Ser/Plas:Qn:: 1.6

## 2018-03-04 LAB — MEAN CORPUSCULAR VOLUME: Lab: 94.1

## 2018-03-04 LAB — PROTEIN URINE: Protein:MCnc:Pt:Urine:Qn:: 26.1

## 2018-03-04 NOTE — Unmapped (Signed)
[facility] Refill Coordination Note  Specialty Medication(s): Prograf 1mg , Myfortic 180mg   Additional Medications shipped: amlodipine, atorvastatin    Justin Mcknight, DOB: [birth year]  Phone: [phone] (home) , Alternate phone contact: N/A  Phone or address changes today?: No  All above HIPAA information was verified with patient.  Shipping Address: [address]  [place] [address]   Insurance changes? No    Completed refill call assessment today to schedule patient's medication shipment from the [facility] [phone]).      Confirmed the medication and dosage are correct and have not changed: No, patient reports changes to the regimen as follows: Prograf is now 3mg  po bid, pt is aware    Confirmed patient started or stopped the following medications in the past month:  No, there are no changes reported at this time.    Are you tolerating your medication?:  Justin Mcknight reports tolerating the medication.    ADHERENCE    Myfortic 180 mg   Quantity filled last month: 180   # of tablets left on hand: 7 days  Prograf 1 mg   Quantity filled last month: 240   # of tablets left on hand: 7 days    Did you miss any doses in the past 4 weeks? No missed doses reported.    FINANCIAL/SHIPPING    Delivery Scheduled: Yes, Expected medication delivery date: 03/06/18     The patient will receive a drug information handout for each medication shipped and additional FDA Medication Guides as required.      Justin Mcknight did not have any additional questions at this time.    Delivery address validated in Epic.    We will follow up with patient monthly for standard refill processing and delivery.      Thank you,  Justin Mcknight   [facility] Specialty Pharmacist

## 2018-03-05 MED FILL — PROGRAF 1 MG CAPSULE: 30 days supply | Qty: 180 | Fill #0 | Status: AC

## 2018-03-05 MED FILL — ATORVASTATIN 10 MG TABLET: 30 days supply | Qty: 30 | Fill #0 | Status: AC

## 2018-03-05 MED FILL — AMLODIPINE 5 MG TABLET: 30 days supply | Qty: 30 | Fill #0 | Status: AC

## 2018-03-05 MED FILL — MYFORTIC 180 MG TABLET,DELAYED RELEASE: ORAL | 30 days supply | Qty: 180 | Fill #0

## 2018-03-05 MED FILL — MYFORTIC 180 MG TABLET,DELAYED RELEASE: 30 days supply | Qty: 180 | Fill #0 | Status: AC

## 2018-03-11 ENCOUNTER — Ambulatory Visit: Admit: 2018-03-11 | Discharge: 2018-03-12 | Payer: MEDICARE

## 2018-03-11 DIAGNOSIS — Z79899 Other long term (current) drug therapy: Secondary | ICD-10-CM

## 2018-03-11 DIAGNOSIS — Z940 Kidney transplant status: Principal | ICD-10-CM

## 2018-03-11 LAB — CBC W/ AUTO DIFF
HEMATOCRIT: 53.8 % — ABNORMAL HIGH (ref 41.0–53.0)
HEMOGLOBIN: 16.8 g/dL (ref 13.5–17.5)
MEAN CORPUSCULAR HEMOGLOBIN: 29.4 pg (ref 26.0–34.0)
MEAN PLATELET VOLUME: 8.3 fL (ref 7.0–10.0)
PLATELET COUNT: 236 10*9/L (ref 150–440)
RED BLOOD CELL COUNT: 5.69 10*12/L (ref 4.50–5.90)
RED CELL DISTRIBUTION WIDTH: 14.5 % (ref 12.0–15.0)
WBC ADJUSTED: 7 10*9/L (ref 4.5–11.0)

## 2018-03-11 LAB — RENAL FUNCTION PANEL
ALBUMIN: 4.8 g/dL (ref 3.5–5.0)
ANION GAP: 13 mmol/L (ref 9–15)
BLOOD UREA NITROGEN: 22 mg/dL — ABNORMAL HIGH (ref 7–21)
BUN / CREAT RATIO: 11
CALCIUM: 10.4 mg/dL — ABNORMAL HIGH (ref 8.5–10.2)
CHLORIDE: 102 mmol/L (ref 98–107)
CO2: 26 mmol/L (ref 22.0–30.0)
CREATININE: 1.99 mg/dL — ABNORMAL HIGH (ref 0.70–1.30)
EGFR CKD-EPI AA MALE: 41 mL/min/{1.73_m2} — ABNORMAL LOW (ref >=60)
EGFR CKD-EPI NON-AA MALE: 35 mL/min/{1.73_m2} — ABNORMAL LOW (ref >=60)
GLUCOSE RANDOM: 125 mg/dL — ABNORMAL HIGH (ref 65–99)
POTASSIUM: 5.5 mmol/L — ABNORMAL HIGH (ref 3.5–5.0)
SODIUM: 141 mmol/L (ref 135–145)

## 2018-03-11 LAB — MANUAL DIFFERENTIAL
BASOPHILS - ABS (DIFF): 0 10*9/L (ref 0.0–0.1)
BASOPHILS - REL (DIFF): 0 %
EOSINOPHILS - ABS (DIFF): 0 10*9/L (ref 0.0–0.4)
EOSINOPHILS - REL (DIFF): 0 %
LYMPHOCYTES - ABS (DIFF): 0.4 10*9/L — ABNORMAL LOW (ref 1.5–5.0)
LYMPHOCYTES - REL (DIFF): 6 %
MONOCYTES - ABS (DIFF): 0.7 10*9/L (ref 0.2–0.8)
NEUTROPHILS - ABS (DIFF): 5.9 10*9/L (ref 2.0–7.5)
NEUTROPHILS - REL (DIFF): 84 %

## 2018-03-11 LAB — MAGNESIUM: Magnesium:MCnc:Pt:Ser/Plas:Qn:: 1.7

## 2018-03-11 LAB — TACROLIMUS, TROUGH: Lab: 7.8

## 2018-03-11 LAB — PROTEIN / CREATININE RATIO, URINE
CREATININE, URINE: 138.1 mg/dL
PROTEIN URINE: 24.3 mg/dL

## 2018-03-11 LAB — MONOCYTES - ABS (DIFF): Lab: 0.7

## 2018-03-11 LAB — BLOOD UREA NITROGEN: Urea nitrogen:MCnc:Pt:Ser/Plas:Qn:: 22 — ABNORMAL HIGH

## 2018-03-11 LAB — PROTEIN URINE: Protein:MCnc:Pt:Urine:Qn:: 24.3

## 2018-03-11 LAB — HEMATOCRIT: Lab: 53.8 — ABNORMAL HIGH

## 2018-03-18 ENCOUNTER — Ambulatory Visit: Admit: 2018-03-18 | Discharge: 2018-03-19 | Payer: MEDICARE

## 2018-03-18 DIAGNOSIS — Z940 Kidney transplant status: Principal | ICD-10-CM

## 2018-03-18 LAB — CBC W/ AUTO DIFF
BASOPHILS ABSOLUTE COUNT: 0 10*9/L (ref 0.0–0.1)
BASOPHILS RELATIVE PERCENT: 0.6 %
EOSINOPHILS ABSOLUTE COUNT: 0.1 10*9/L (ref 0.0–0.4)
HEMATOCRIT: 51.7 % (ref 41.0–53.0)
HEMOGLOBIN: 16.1 g/dL (ref 13.5–17.5)
LARGE UNSTAINED CELLS: 4 % (ref 0–4)
LYMPHOCYTES RELATIVE PERCENT: 11.3 %
MEAN CORPUSCULAR HEMOGLOBIN CONC: 31.2 g/dL (ref 31.0–37.0)
MEAN CORPUSCULAR HEMOGLOBIN: 29.3 pg (ref 26.0–34.0)
MEAN CORPUSCULAR VOLUME: 93.9 fL (ref 80.0–100.0)
MEAN PLATELET VOLUME: 7.9 fL (ref 7.0–10.0)
MONOCYTES ABSOLUTE COUNT: 0.5 10*9/L (ref 0.2–0.8)
MONOCYTES RELATIVE PERCENT: 9 %
NEUTROPHILS ABSOLUTE COUNT: 4.4 10*9/L (ref 2.0–7.5)
NEUTROPHILS RELATIVE PERCENT: 73.5 %
PLATELET COUNT: 204 10*9/L (ref 150–440)
RED BLOOD CELL COUNT: 5.5 10*12/L (ref 4.50–5.90)
RED CELL DISTRIBUTION WIDTH: 14.9 % (ref 12.0–15.0)

## 2018-03-18 LAB — MAGNESIUM: Magnesium:MCnc:Pt:Ser/Plas:Qn:: 1.6

## 2018-03-18 LAB — RENAL FUNCTION PANEL
ALBUMIN: 4.7 g/dL (ref 3.5–5.0)
ANION GAP: 11 mmol/L (ref 9–15)
BLOOD UREA NITROGEN: 22 mg/dL — ABNORMAL HIGH (ref 7–21)
BUN / CREAT RATIO: 12
CALCIUM: 10.2 mg/dL (ref 8.5–10.2)
CHLORIDE: 103 mmol/L (ref 98–107)
CO2: 26 mmol/L (ref 22.0–30.0)
CREATININE: 1.91 mg/dL — ABNORMAL HIGH (ref 0.70–1.30)
EGFR CKD-EPI AA MALE: 43 mL/min/{1.73_m2} — ABNORMAL LOW (ref >=60)
EGFR CKD-EPI NON-AA MALE: 37 mL/min/{1.73_m2} — ABNORMAL LOW (ref >=60)
GLUCOSE RANDOM: 123 mg/dL (ref 65–179)
PHOSPHORUS: 3.1 mg/dL (ref 2.9–4.7)
POTASSIUM: 4.8 mmol/L (ref 3.5–5.0)

## 2018-03-18 LAB — CO2: Carbon dioxide:SCnc:Pt:Ser/Plas:Qn:: 26

## 2018-03-18 LAB — CREATININE, URINE: Lab: 106.8

## 2018-03-18 LAB — PROTEIN / CREATININE RATIO, URINE
CREATININE, URINE: 106.8 mg/dL
PROTEIN/CREAT RATIO, URINE: 0.204

## 2018-03-18 LAB — RED CELL DISTRIBUTION WIDTH: Lab: 14.9

## 2018-03-18 LAB — TACROLIMUS, TROUGH: Lab: 7.5

## 2018-03-24 NOTE — Unmapped (Signed)
[facility] Refill and Clinical Coordination Note  Medication(s): myfortic, prograf    [facility], [place]: 1956-02-02  Phone: [phone] (home) , Alternate phone contact: N/A  Shipping address: [address]  [place] [address]  Phone or address changes today?: No  All above HIPAA information verified.  Insurance changes? No    Completed refill and clinical call assessment today to schedule patient's medication shipment from the [facility] [phone]).      MEDICATION RECONCILIATION    Confirmed the medication and dosage are correct and have not changed: Yes, regimen is correct and unchanged.    Were there any changes to your medication(s) in the past month:  Reports stopping the following medications: bactrim. It is still saying active in epic med list, however per 8/20 CPP note looks like pt restarted 6 mo supply in early March - will verify with coordinator as well today.    ADHERENCE    Is this medicine transplant or covered by Medicare Part B? Yes.    Prograf 1 mg   Quantity filled last month: 180   # of tablets left on hand: 12 days left    Myfortic 180 mg   Quantity filled last month: 180   # of tablets left on hand: 12 days left      Did you miss any doses in the past 4 weeks? No missed doses reported.  Adherence counseling provided? Not needed     SIDE EFFECT MANAGEMENT    Are you tolerating your medication?:  Orrie reports tolerating the medication.  Side effect management discussed: None      Therapy is appropriate and should be continued.    Evidence of clinical benefit: See Epic note from 02/25/18      FINANCIAL/SHIPPING    Delivery Scheduled: Yes, Expected medication delivery date: 04/02/18 via ups     Additional medications refilled: amlodipine, atorvastatin - 4rx total    The patient will receive a drug information handout for each medication shipped and additional FDA Medication Guides as required.      Axtyn did not have any additional questions at this time.    Delivery address confirmed in Epic.     We will follow up with patient monthly for standard refill processing and delivery.      Thank you,  Thad Ranger   [facility] Specialty Pharmacist

## 2018-03-25 ENCOUNTER — Ambulatory Visit: Admit: 2018-03-25 | Discharge: 2018-03-26 | Payer: MEDICARE

## 2018-03-25 DIAGNOSIS — Z940 Kidney transplant status: Principal | ICD-10-CM

## 2018-03-25 LAB — CBC W/ AUTO DIFF
BASOPHILS ABSOLUTE COUNT: 0 10*9/L (ref 0.0–0.1)
EOSINOPHILS ABSOLUTE COUNT: 0.1 10*9/L (ref 0.0–0.4)
EOSINOPHILS RELATIVE PERCENT: 0.7 %
HEMATOCRIT: 49.9 % (ref 41.0–53.0)
HEMOGLOBIN: 15.5 g/dL (ref 13.5–17.5)
LARGE UNSTAINED CELLS: 3 % (ref 0–4)
LYMPHOCYTES ABSOLUTE COUNT: 0.7 10*9/L — ABNORMAL LOW (ref 1.5–5.0)
LYMPHOCYTES RELATIVE PERCENT: 10.9 %
MEAN CORPUSCULAR HEMOGLOBIN CONC: 31.1 g/dL (ref 31.0–37.0)
MEAN CORPUSCULAR VOLUME: 93.3 fL (ref 80.0–100.0)
MEAN PLATELET VOLUME: 8.1 fL (ref 7.0–10.0)
MONOCYTES ABSOLUTE COUNT: 1.2 10*9/L — ABNORMAL HIGH (ref 0.2–0.8)
MONOCYTES RELATIVE PERCENT: 17.6 %
NEUTROPHILS ABSOLUTE COUNT: 4.4 10*9/L (ref 2.0–7.5)
NEUTROPHILS RELATIVE PERCENT: 67.1 %
PLATELET COUNT: 197 10*9/L (ref 150–440)
RED BLOOD CELL COUNT: 5.35 10*12/L (ref 4.50–5.90)
RED CELL DISTRIBUTION WIDTH: 15 % (ref 12.0–15.0)
WBC ADJUSTED: 6.6 10*9/L (ref 4.5–11.0)

## 2018-03-25 LAB — RENAL FUNCTION PANEL
ALBUMIN: 4.6 g/dL (ref 3.5–5.0)
BLOOD UREA NITROGEN: 20 mg/dL (ref 7–21)
BUN / CREAT RATIO: 11
CALCIUM: 9.9 mg/dL (ref 8.5–10.2)
CHLORIDE: 105 mmol/L (ref 98–107)
CO2: 20 mmol/L — ABNORMAL LOW (ref 22.0–30.0)
EGFR CKD-EPI AA MALE: 44 mL/min/{1.73_m2} — ABNORMAL LOW (ref >=60)
EGFR CKD-EPI NON-AA MALE: 38 mL/min/{1.73_m2} — ABNORMAL LOW (ref >=60)
GLUCOSE RANDOM: 110 mg/dL (ref 65–179)
PHOSPHORUS: 3.3 mg/dL (ref 2.9–4.7)
POTASSIUM: 4.5 mmol/L (ref 3.5–5.0)

## 2018-03-25 LAB — MAGNESIUM: Magnesium:MCnc:Pt:Ser/Plas:Qn:: 1.5 — ABNORMAL LOW

## 2018-03-25 LAB — PROTEIN/CREAT RATIO, URINE: Protein/Creatinine:MRto:Pt:Urine:Qn:: 0.245

## 2018-03-25 LAB — PROTEIN / CREATININE RATIO, URINE: CREATININE, URINE: 132 mg/dL

## 2018-03-25 LAB — EGFR CKD-EPI NON-AA MALE: Lab: 38 — ABNORMAL LOW

## 2018-03-25 LAB — TACROLIMUS, TROUGH: Lab: 8.5

## 2018-03-25 LAB — RED BLOOD CELL COUNT: Lab: 5.35

## 2018-03-31 IMAGING — CT CT ABD-PELV W/O CM
2 of 4 series · 16 of 46 positions shown, 18 images · non-contrast
Comparison: CT scan of September 09, 2010.

CLINICAL DATA: Acute generalized abdominal pain.

EXAM:
CT ABDOMEN AND PELVIS WITHOUT CONTRAST
TECHNIQUE: Multidetector CT imaging of the abdomen and pelvis was performed
following the standard protocol without IV contrast.

[Series 2: routine abd/pel wo · axial · 0.73mm/px · z∈[-1121,-681]mm · 13 of 98 slices shown, 15 images]
[im 5/98  soft-tissue]
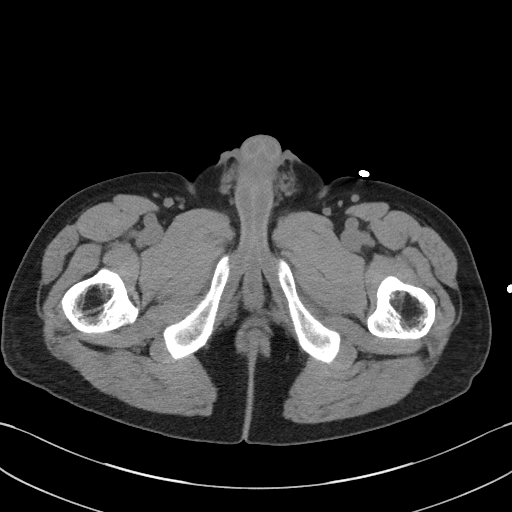
[im 5/98  bone]
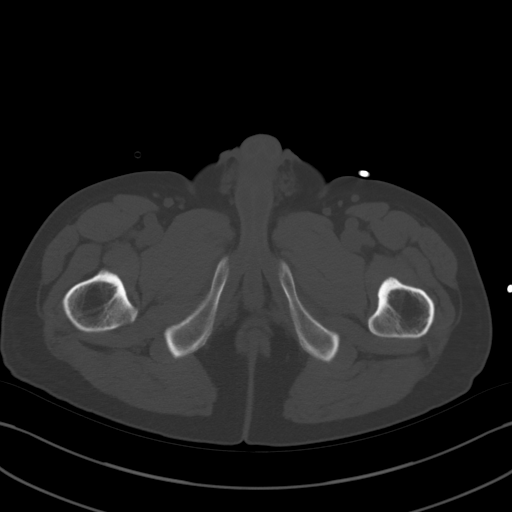
[im 13/98  soft-tissue]
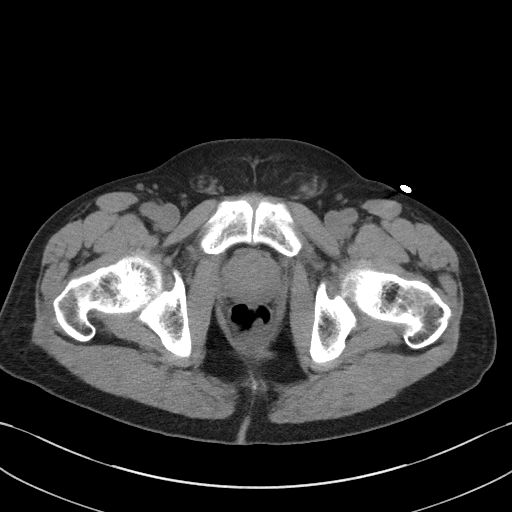
[im 22/98  soft-tissue]
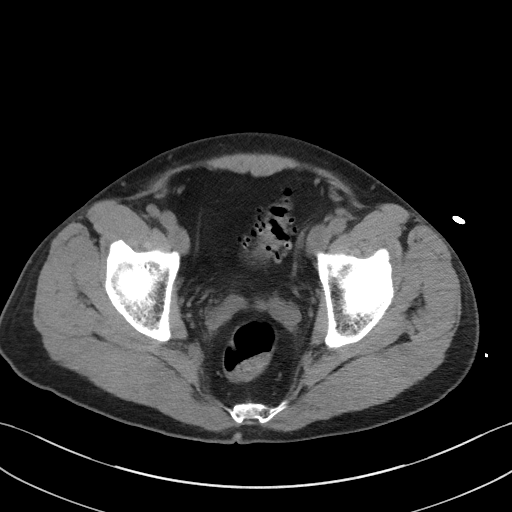
[im 26/98  soft-tissue]
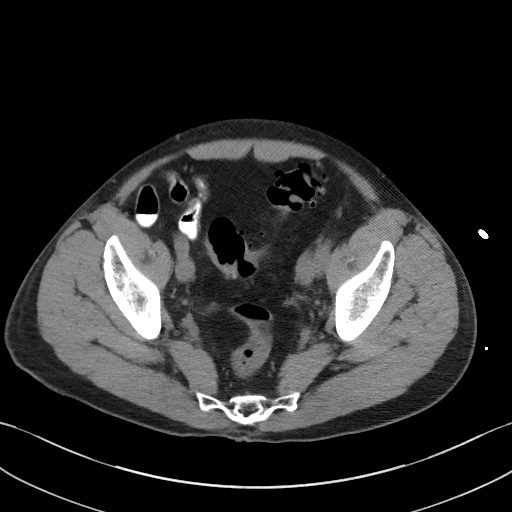
[im 34/98  soft-tissue]
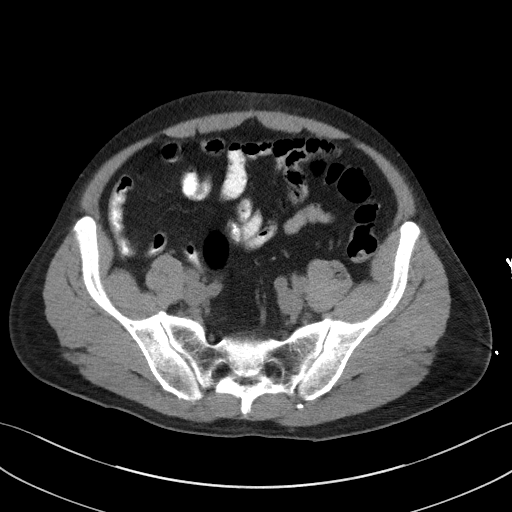
[im 43/98  soft-tissue]
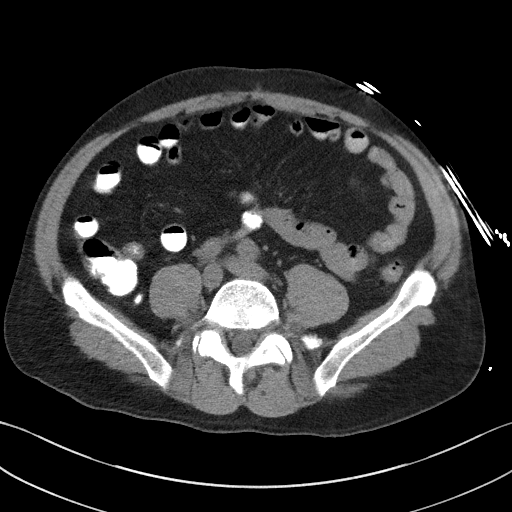
[im 51/98  soft-tissue]
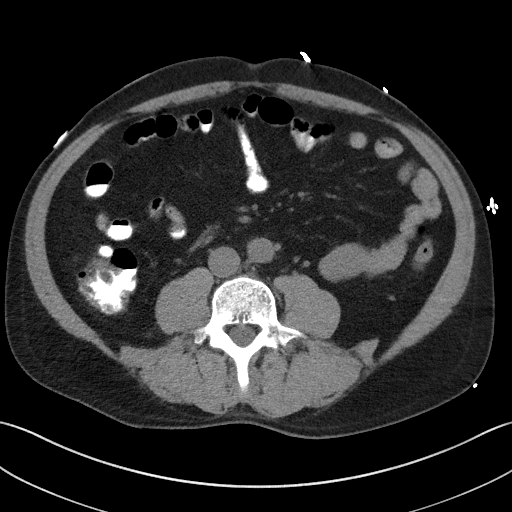
[im 55/98  soft-tissue]
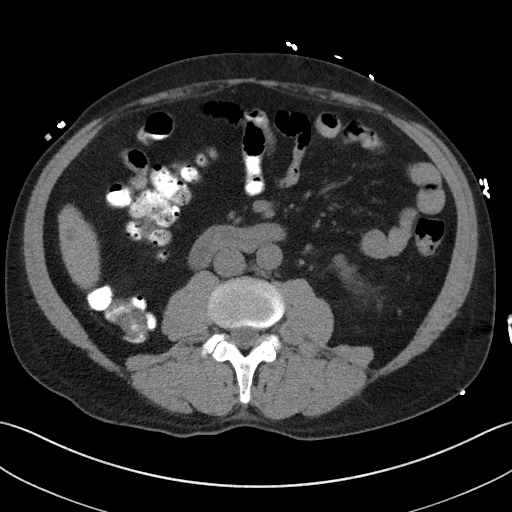
[im 64/98  soft-tissue]
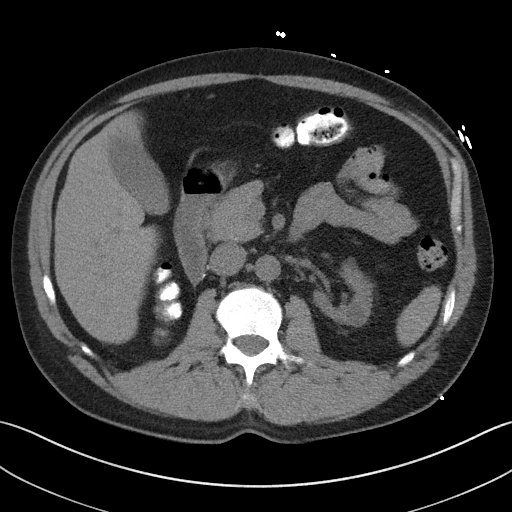
[im 64/98  bone]
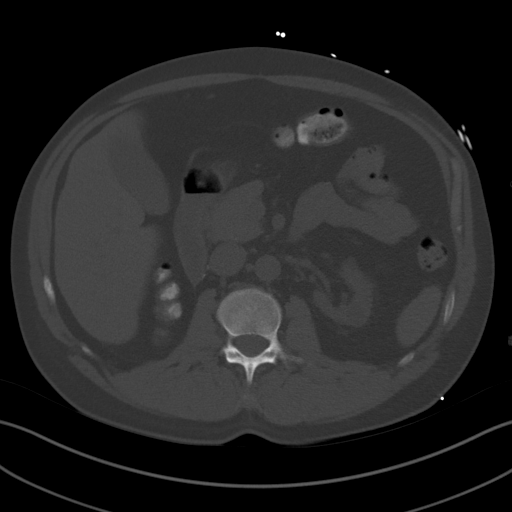
[im 72/98  soft-tissue]
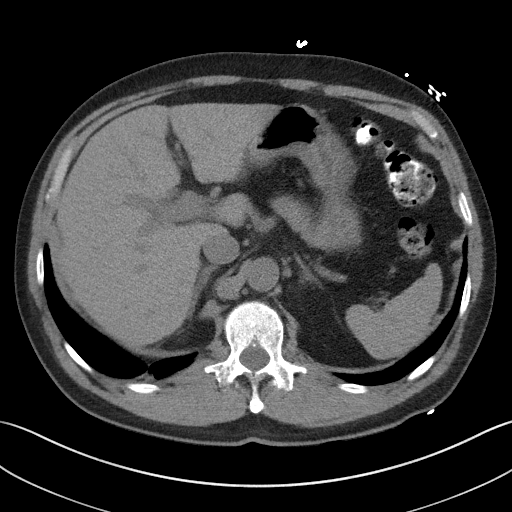
[im 76/98  soft-tissue]
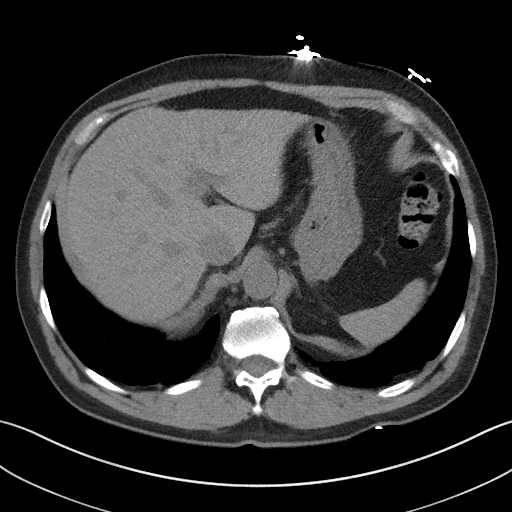
[im 85/98  soft-tissue]
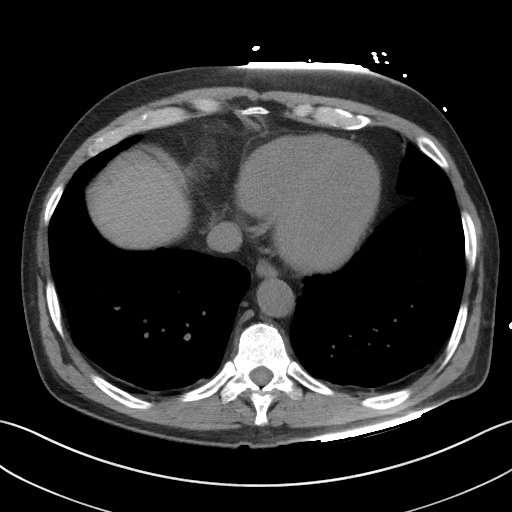
[im 93/98  soft-tissue]
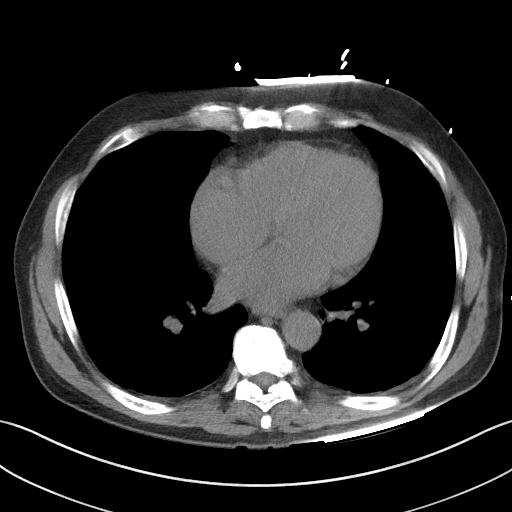

[Series 5: coronal st · coronal · 0.73mm/px · 3 of 100 slices shown]
[im 34/100  soft-tissue]
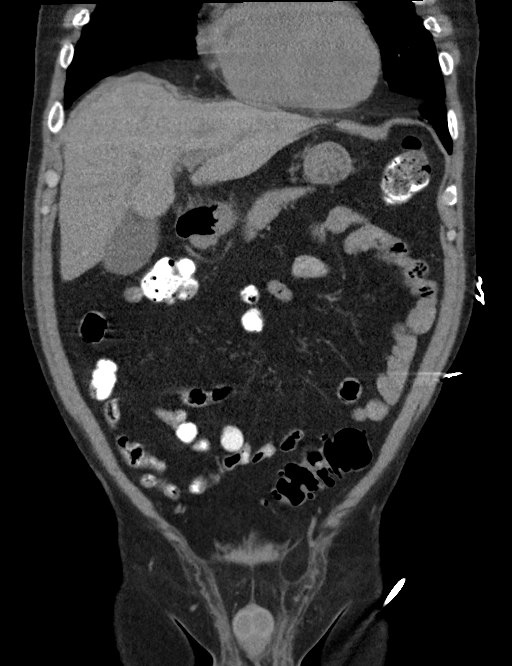
[im 45/100  soft-tissue]
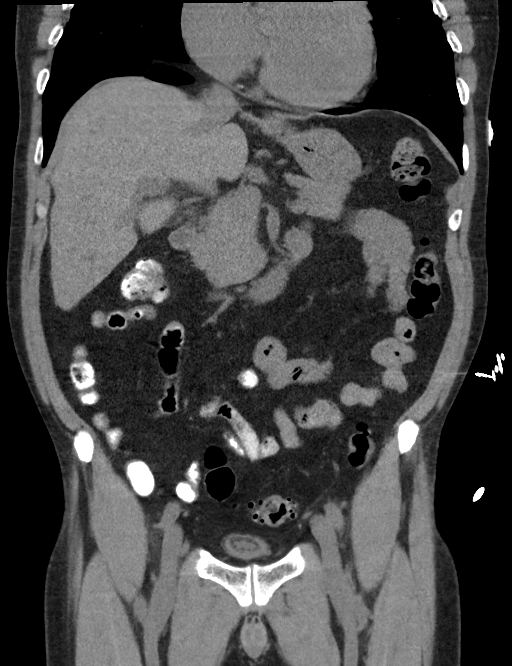
[im 56/100  soft-tissue]
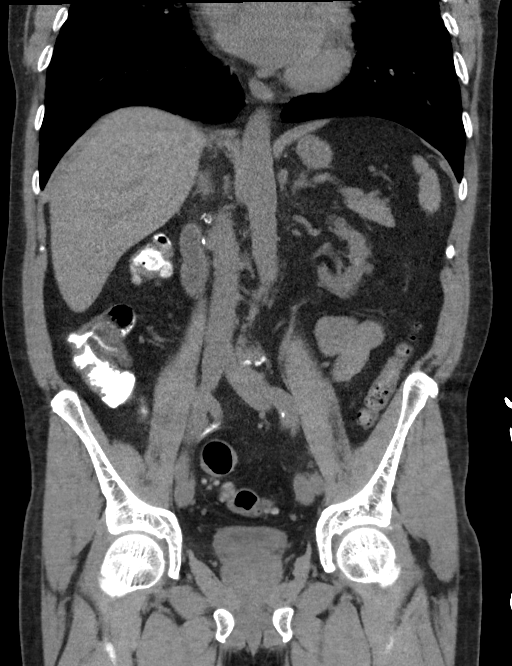

[16 of 46 positions shown; findings below may reference images not displayed]

FINDINGS: Lower chest: No acute abnormality.

Hepatobiliary: No gallstones are noted. Small right hepatic cyst is
noted.

Pancreas: Unremarkable. No pancreatic ductal dilatation or
surrounding inflammatory changes.

Spleen: Normal in size without focal abnormality.

Adrenals/Urinary Tract: Adrenal glands are unremarkable. Status post
right nephrectomy. Left renal atrophy is noted with multiple cysts
consistent with end-stage renal disease. No hydronephrosis or renal
obstruction is noted. Urinary bladder is decompressed.

Stomach/Bowel: Stomach is within normal limits. Appendix appears
normal. No evidence of bowel wall thickening, distention, or
inflammatory changes. Sigmoid diverticulosis is noted without
inflammation.

Vascular/Lymphatic: No significant vascular findings are present. No
enlarged abdominal or pelvic lymph nodes.

Reproductive: Stable mild prostatic enlargement is noted.

Other: Small fat containing left inguinal hernia is noted. No
abnormal fluid collection is noted.

Musculoskeletal: No acute or significant osseous findings.
IMPRESSION: Status post right nephrectomy.

Left renal atrophy is noted consistent with end-stage renal disease.

Sigmoid diverticulosis is noted without inflammation.

Stable mild prostatic enlargement.

Small fat containing left inguinal hernia.

## 2018-04-01 MED FILL — ATORVASTATIN 10 MG TABLET: 30 days supply | Qty: 30 | Fill #1 | Status: AC

## 2018-04-01 MED FILL — MYFORTIC 180 MG TABLET,DELAYED RELEASE: ORAL | 30 days supply | Qty: 180 | Fill #1

## 2018-04-01 MED FILL — ATORVASTATIN 10 MG TABLET: ORAL | 30 days supply | Qty: 30 | Fill #1

## 2018-04-01 MED FILL — MYFORTIC 180 MG TABLET,DELAYED RELEASE: 30 days supply | Qty: 180 | Fill #1 | Status: AC

## 2018-04-01 MED FILL — PROGRAF 1 MG CAPSULE: 30 days supply | Qty: 180 | Fill #1 | Status: AC

## 2018-04-01 MED FILL — AMLODIPINE 5 MG TABLET: ORAL | 30 days supply | Qty: 30 | Fill #1

## 2018-04-01 MED FILL — AMLODIPINE 5 MG TABLET: 30 days supply | Qty: 30 | Fill #1 | Status: AC

## 2018-04-01 MED FILL — PROGRAF 1 MG CAPSULE: ORAL | 30 days supply | Qty: 180 | Fill #1

## 2018-04-02 ENCOUNTER — Ambulatory Visit: Admit: 2018-04-02 | Discharge: 2018-04-03 | Payer: MEDICARE

## 2018-04-02 DIAGNOSIS — Z940 Kidney transplant status: Principal | ICD-10-CM

## 2018-04-02 LAB — PROTEIN / CREATININE RATIO, URINE
CREATININE, URINE: 90.2 mg/dL
PROTEIN URINE: 22.5 mg/dL

## 2018-04-02 LAB — MAGNESIUM: Magnesium:MCnc:Pt:Ser/Plas:Qn:: 1.7

## 2018-04-02 LAB — RENAL FUNCTION PANEL
ALBUMIN: 4.5 g/dL (ref 3.5–5.0)
ANION GAP: 11 mmol/L (ref 9–15)
BLOOD UREA NITROGEN: 25 mg/dL — ABNORMAL HIGH (ref 7–21)
CALCIUM: 10 mg/dL (ref 8.5–10.2)
CHLORIDE: 104 mmol/L (ref 98–107)
CO2: 24 mmol/L (ref 22.0–30.0)
CREATININE: 1.72 mg/dL — ABNORMAL HIGH (ref 0.70–1.30)
EGFR CKD-EPI AA MALE: 49 mL/min/{1.73_m2} — ABNORMAL LOW (ref >=60)
EGFR CKD-EPI NON-AA MALE: 42 mL/min/{1.73_m2} — ABNORMAL LOW (ref >=60)
GLUCOSE RANDOM: 103 mg/dL — ABNORMAL HIGH (ref 65–99)
PHOSPHORUS: 3.3 mg/dL (ref 2.9–4.7)
POTASSIUM: 4.7 mmol/L (ref 3.5–5.0)

## 2018-04-02 LAB — MANUAL DIFFERENTIAL
EOSINOPHILS - ABS (DIFF): 0.1 10*9/L (ref 0.0–0.4)
EOSINOPHILS - REL (DIFF): 1 %
LYMPHOCYTES - ABS (DIFF): 0.6 10*9/L — ABNORMAL LOW (ref 1.5–5.0)
MONOCYTES - ABS (DIFF): 0.2 10*9/L (ref 0.2–0.8)
MONOCYTES - REL (DIFF): 4 %
NEUTROPHILS - ABS (DIFF): 4.7 10*9/L (ref 2.0–7.5)
NEUTROPHILS - REL (DIFF): 85 %

## 2018-04-02 LAB — CBC W/ AUTO DIFF
HEMATOCRIT: 47.3 % (ref 41.0–53.0)
MEAN CORPUSCULAR HEMOGLOBIN CONC: 31.7 g/dL (ref 31.0–37.0)
MEAN CORPUSCULAR HEMOGLOBIN: 29.5 pg (ref 26.0–34.0)
MEAN PLATELET VOLUME: 8 fL (ref 7.0–10.0)
RED BLOOD CELL COUNT: 5.08 10*12/L (ref 4.50–5.90)
RED CELL DISTRIBUTION WIDTH: 15.2 % — ABNORMAL HIGH (ref 12.0–15.0)
WBC ADJUSTED: 5.5 10*9/L (ref 4.5–11.0)

## 2018-04-02 LAB — HEMATOCRIT: Lab: 47.3

## 2018-04-02 LAB — TACROLIMUS, TROUGH: Lab: 7.8

## 2018-04-02 LAB — PROTEIN/CREAT RATIO, URINE: Protein/Creatinine:MRto:Pt:Urine:Qn:: 0.249

## 2018-04-02 LAB — NEUTROPHILS - REL (DIFF): Lab: 85

## 2018-04-02 LAB — BLOOD UREA NITROGEN: Urea nitrogen:MCnc:Pt:Ser/Plas:Qn:: 25 — ABNORMAL HIGH

## 2018-04-03 ENCOUNTER — Ambulatory Visit: Admit: 2018-04-03 | Discharge: 2018-04-04 | Payer: MEDICARE | Attending: Surgery | Primary: Surgery

## 2018-04-03 DIAGNOSIS — Z940 Kidney transplant status: Secondary | ICD-10-CM

## 2018-04-03 DIAGNOSIS — T82898A Other specified complication of vascular prosthetic devices, implants and grafts, initial encounter: Principal | ICD-10-CM

## 2018-04-03 NOTE — Unmapped (Signed)
Please see clinic note from 9/26 for H&P.

## 2018-04-03 NOTE — Unmapped (Signed)
VASCULAR ACCESS CLINIC    Assessment/Plan  [facility] presented to clinic for consultation for vascular access.    AVF is large, distended, and bulbous pseudoaneurysmal proximal to anastamosis. Overall, functional and patent +thrill/+bruit, but appears to have some outflow obstruction as it does not empty well or flatten with elevation of extremity. It is only mildly compressible to touch at a level even to his heart.     He was consented for revision/ligation of AVF with Dr. Norma Fredrickson 04/25/2018. Pre-operative orders placed.    Subjective  Justin Mcknight is a 62 y.o. male who presents in consultation for vascular access. His PMH is significant for Deceased Donor Kidney transplant 09/03/2017 for ESRD 2/2 HTN. He has done well and is back to work. He would like to improve the cosmetic appearance of his fistula as he feels he is being discriminated against in the workplace and would like to avoid people from staring. His creatine remains stable but ranges from 1.7-2. He was referred by his transplant Nephrologist Dr. Toni Arthurs.    He otherwise has no issues with the fistula, mild pain with palpation over the more proximal area to the fistula. No constitutional symptoms.      Allergies  Codeine    Medications    Current Outpatient Medications   Medication Sig Dispense Refill   ??? amLODIPine (NORVASC) 5 MG tablet Take 1 tablet (5 mg total) by mouth every evening. 30 tablet 11   ??? atorvastatin (LIPITOR) 10 MG tablet Take 1 tablet (10 mg total) by mouth daily. 30 tablet 11   ??? MYFORTIC 180 mg EC tablet Take 3 tablets (540mg ) by mouth 2 times daily 180 tablet 11   ??? PROGRAF 1 mg capsule Take 3 capsules (3 mg total) by mouth two (2) times a day. 240 capsule 11     No current facility-administered medications for this visit.        Past Medical History  Past Medical History:   Diagnosis Date   ??? Anemia of chronic disease    ??? Cough 11/04/2012   ??? End stage renal disease on dialysis (CMS-HCC)     started 09/2010   ??? Gout    ??? Hepatic steatosis    ??? Hypertension    ??? Patient awaiting renal transplant 09/03/2017   ??? Renal cell carcinoma (CMS-HCC)     03/2011   ??? Secondary hyperparathyroidism of renal origin (CMS-HCC)    ??? Shortness of breath 11/04/2012       Past Surgical History  Past Surgical History:   Procedure Laterality Date   ??? DIALYSIS FISTULA CREATION      LUE   ??? KIDNEY SURGERY      right  removed   ??? PR COLSC FLX W/RMVL OF TUMOR POLYP LESION SNARE TQ Left 01/21/2013    Procedure: COLONOSCOPY FLEX; W/REMOV TUMOR/LES BY SNARE;  Surgeon: Romero Belling, MD;  [facility];  Service: Gastroenterology   ??? PR TRANSPLANTATION OF KIDNEY Right 09/03/2017    Procedure: RENAL ALLOTRANSPLANTATION, IMPLANTATION OF GRAFT; WITHOUT RECIPIENT NEPHRECTOMY;  Surgeon: Doyce Loose, MD;  Location: MAIN OR [facility];  Service: Transplant       Review of Systems  A 12 system review of systems was negative except as noted in HPI    Objective  Blood pressure 117/86, pulse 102, temperature 36.9 ??C (98.4 ??F), temperature source Tympanic, height 188 cm (6' 2.02), weight 83.8 kg (184 lb 11.2 oz). Body mass index is 23.7 kg/m??.  Lungs: clear to auscultation, percussion to the bases, and unlabored breathing   Heart: euvolemic, regular rate and rhythm, normal S1 and S2, no murmur  Ext: no edema, well perfused  Neuro: grossly intact    Test Results  Lab Results   Component Value Date    WBC 5.5 04/02/2018    HGB 15.0 04/02/2018    HCT 47.3 04/02/2018    PLT 216 04/02/2018     Lab Results   Component Value Date    NA 139 04/02/2018    K 4.7 04/02/2018    CL 104 04/02/2018    CO2 24.0 04/02/2018    BUN 25 (H) 04/02/2018    CREATININE 1.72 (H) 04/02/2018    CALCIUM 10.0 04/02/2018    MG 1.7 04/02/2018    PHOS 3.3 04/02/2018     _______________________________________    Gertie Fey, DNP, APRN, FNP-C  [facility]  [address]  [place], [place]  [address]

## 2018-04-03 NOTE — Unmapped (Signed)
Justin Peals, DNP discussing plan of care for [name] existing fistula and the need to keep the fistula until he is at least one year post kidney transplant. She feels it is still functioning and could be used for dialysis if necessary.

## 2018-04-08 ENCOUNTER — Ambulatory Visit: Admit: 2018-04-08 | Discharge: 2018-04-09 | Payer: MEDICARE

## 2018-04-08 DIAGNOSIS — Z940 Kidney transplant status: Principal | ICD-10-CM

## 2018-04-08 LAB — RENAL FUNCTION PANEL
ANION GAP: 10 mmol/L (ref 7–15)
BLOOD UREA NITROGEN: 25 mg/dL — ABNORMAL HIGH (ref 7–21)
BUN / CREAT RATIO: 14
CALCIUM: 10 mg/dL (ref 8.5–10.2)
CO2: 24 mmol/L (ref 22.0–30.0)
CREATININE: 1.77 mg/dL — ABNORMAL HIGH (ref 0.70–1.30)
EGFR CKD-EPI AA MALE: 47 mL/min/{1.73_m2} — ABNORMAL LOW (ref >=60)
EGFR CKD-EPI NON-AA MALE: 41 mL/min/{1.73_m2} — ABNORMAL LOW (ref >=60)
GLUCOSE RANDOM: 106 mg/dL (ref 65–179)
PHOSPHORUS: 3.2 mg/dL (ref 2.9–4.7)
POTASSIUM: 5.2 mmol/L — ABNORMAL HIGH (ref 3.5–5.0)
SODIUM: 139 mmol/L (ref 135–145)

## 2018-04-08 LAB — CBC W/ AUTO DIFF
BASOPHILS ABSOLUTE COUNT: 0 10*9/L (ref 0.0–0.1)
BASOPHILS RELATIVE PERCENT: 0.6 %
EOSINOPHILS ABSOLUTE COUNT: 0.1 10*9/L (ref 0.0–0.4)
EOSINOPHILS RELATIVE PERCENT: 2.1 %
HEMATOCRIT: 47.4 % (ref 41.0–53.0)
HEMOGLOBIN: 14.8 g/dL (ref 13.5–17.5)
LARGE UNSTAINED CELLS: 7 % — ABNORMAL HIGH (ref 0–4)
LYMPHOCYTES ABSOLUTE COUNT: 0.5 10*9/L — ABNORMAL LOW (ref 1.5–5.0)
LYMPHOCYTES RELATIVE PERCENT: 10.8 %
MEAN CORPUSCULAR HEMOGLOBIN: 29.3 pg (ref 26.0–34.0)
MEAN CORPUSCULAR VOLUME: 94 fL (ref 80.0–100.0)
MEAN PLATELET VOLUME: 8.3 fL (ref 7.0–10.0)
MONOCYTES ABSOLUTE COUNT: 1.3 10*9/L — ABNORMAL HIGH (ref 0.2–0.8)
MONOCYTES RELATIVE PERCENT: 29.2 %
NEUTROPHILS RELATIVE PERCENT: 50.4 %
PLATELET COUNT: 209 10*9/L (ref 150–440)
RED BLOOD CELL COUNT: 5.04 10*12/L (ref 4.50–5.90)
RED CELL DISTRIBUTION WIDTH: 15.2 % — ABNORMAL HIGH (ref 12.0–15.0)
WBC ADJUSTED: 4.3 10*9/L — ABNORMAL LOW (ref 4.5–11.0)

## 2018-04-08 LAB — MAGNESIUM: Magnesium:MCnc:Pt:Ser/Plas:Qn:: 1.7

## 2018-04-08 LAB — TACROLIMUS, TROUGH: Lab: 9.2

## 2018-04-08 LAB — CREATININE, URINE: Lab: 70.5

## 2018-04-08 LAB — GLUCOSE RANDOM: Glucose:MCnc:Pt:Ser/Plas:Qn:: 106

## 2018-04-08 LAB — NEUTROPHILS RELATIVE PERCENT: Lab: 50.4

## 2018-04-08 LAB — PROTEIN / CREATININE RATIO, URINE: PROTEIN/CREAT RATIO, URINE: 0.189

## 2018-04-15 ENCOUNTER — Ambulatory Visit: Admit: 2018-04-15 | Discharge: 2018-04-16 | Payer: MEDICARE

## 2018-04-15 DIAGNOSIS — Z940 Kidney transplant status: Principal | ICD-10-CM

## 2018-04-15 LAB — CBC W/ AUTO DIFF
BASOPHILS ABSOLUTE COUNT: 0.1 10*9/L (ref 0.0–0.1)
BASOPHILS RELATIVE PERCENT: 0.9 %
EOSINOPHILS ABSOLUTE COUNT: 0.1 10*9/L (ref 0.0–0.4)
EOSINOPHILS RELATIVE PERCENT: 1.8 %
HEMATOCRIT: 48.6 % (ref 41.0–53.0)
HEMOGLOBIN: 14.8 g/dL (ref 13.5–17.5)
LARGE UNSTAINED CELLS: 3 % (ref 0–4)
LYMPHOCYTES RELATIVE PERCENT: 14.9 %
MEAN CORPUSCULAR HEMOGLOBIN CONC: 30.5 g/dL — ABNORMAL LOW (ref 31.0–37.0)
MEAN CORPUSCULAR HEMOGLOBIN: 28.8 pg (ref 26.0–34.0)
MEAN CORPUSCULAR VOLUME: 94.4 fL (ref 80.0–100.0)
MEAN PLATELET VOLUME: 7.7 fL (ref 7.0–10.0)
MONOCYTES ABSOLUTE COUNT: 0.7 10*9/L (ref 0.2–0.8)
NEUTROPHILS ABSOLUTE COUNT: 3.4 10*9/L (ref 2.0–7.5)
NEUTROPHILS RELATIVE PERCENT: 66.1 %
RED BLOOD CELL COUNT: 5.15 10*12/L (ref 4.50–5.90)
RED CELL DISTRIBUTION WIDTH: 15.6 % — ABNORMAL HIGH (ref 12.0–15.0)
WBC ADJUSTED: 5.1 10*9/L (ref 4.5–11.0)

## 2018-04-15 LAB — RENAL FUNCTION PANEL
ALBUMIN: 4.4 g/dL (ref 3.5–5.0)
ANION GAP: 7 mmol/L (ref 7–15)
BLOOD UREA NITROGEN: 19 mg/dL (ref 7–21)
BUN / CREAT RATIO: 11
CALCIUM: 9.8 mg/dL (ref 8.5–10.2)
CHLORIDE: 104 mmol/L (ref 98–107)
CO2: 27 mmol/L (ref 22.0–30.0)
CREATININE: 1.7 mg/dL — ABNORMAL HIGH (ref 0.70–1.30)
EGFR CKD-EPI NON-AA MALE: 43 mL/min/{1.73_m2} — ABNORMAL LOW (ref >=60)
GLUCOSE RANDOM: 103 mg/dL (ref 65–179)
PHOSPHORUS: 3.1 mg/dL (ref 2.9–4.7)
SODIUM: 138 mmol/L (ref 135–145)

## 2018-04-15 LAB — MAGNESIUM: Magnesium:MCnc:Pt:Ser/Plas:Qn:: 1.7

## 2018-04-15 LAB — PROTEIN/CREAT RATIO, URINE: Protein/Creatinine:MRto:Pt:Urine:Qn:: 0.214

## 2018-04-15 LAB — TACROLIMUS, TROUGH: Lab: 8.4

## 2018-04-15 LAB — CREATININE: Creatinine:MCnc:Pt:Ser/Plas:Qn:: 1.7 — ABNORMAL HIGH

## 2018-04-15 LAB — PROTEIN / CREATININE RATIO, URINE: PROTEIN URINE: 16.4 mg/dL

## 2018-04-15 LAB — NEUTROPHILS ABSOLUTE COUNT: Lab: 3.4

## 2018-04-22 ENCOUNTER — Ambulatory Visit: Admit: 2018-04-22 | Discharge: 2018-04-23 | Payer: MEDICARE

## 2018-04-22 DIAGNOSIS — Z79899 Other long term (current) drug therapy: Secondary | ICD-10-CM

## 2018-04-22 DIAGNOSIS — Z940 Kidney transplant status: Principal | ICD-10-CM

## 2018-04-22 LAB — CBC W/ AUTO DIFF
BASOPHILS ABSOLUTE COUNT: 0 10*9/L (ref 0.0–0.1)
EOSINOPHILS RELATIVE PERCENT: 1 %
HEMATOCRIT: 48.7 % (ref 41.0–53.0)
LARGE UNSTAINED CELLS: 2 % (ref 0–4)
LYMPHOCYTES ABSOLUTE COUNT: 0.9 10*9/L — ABNORMAL LOW (ref 1.5–5.0)
LYMPHOCYTES RELATIVE PERCENT: 13.3 %
MEAN CORPUSCULAR HEMOGLOBIN CONC: 30.9 g/dL — ABNORMAL LOW (ref 31.0–37.0)
MEAN CORPUSCULAR HEMOGLOBIN: 29.2 pg (ref 26.0–34.0)
MEAN CORPUSCULAR VOLUME: 94.5 fL (ref 80.0–100.0)
MEAN PLATELET VOLUME: 8 fL (ref 7.0–10.0)
MONOCYTES ABSOLUTE COUNT: 0.5 10*9/L (ref 0.2–0.8)
MONOCYTES RELATIVE PERCENT: 6.7 %
NEUTROPHILS ABSOLUTE COUNT: 5.2 10*9/L (ref 2.0–7.5)
NEUTROPHILS RELATIVE PERCENT: 76.3 %
RED BLOOD CELL COUNT: 5.15 10*12/L (ref 4.50–5.90)
RED CELL DISTRIBUTION WIDTH: 15.6 % — ABNORMAL HIGH (ref 12.0–15.0)
WBC ADJUSTED: 6.8 10*9/L (ref 4.5–11.0)

## 2018-04-22 LAB — COMPREHENSIVE METABOLIC PANEL
ALBUMIN: 4.5 g/dL (ref 3.5–5.0)
ALKALINE PHOSPHATASE: 89 U/L (ref 38–126)
ALT (SGPT): 47 U/L (ref 19–72)
ANION GAP: 15 mmol/L (ref 7–15)
AST (SGOT): 35 U/L (ref 19–55)
BLOOD UREA NITROGEN: 25 mg/dL — ABNORMAL HIGH (ref 7–21)
BUN / CREAT RATIO: 15
CALCIUM: 10 mg/dL (ref 8.5–10.2)
CHLORIDE: 104 mmol/L (ref 98–107)
CO2: 24 mmol/L (ref 22.0–30.0)
CREATININE: 1.72 mg/dL — ABNORMAL HIGH (ref 0.70–1.30)
EGFR CKD-EPI AA MALE: 49 mL/min/{1.73_m2} — ABNORMAL LOW (ref >=60)
EGFR CKD-EPI NON-AA MALE: 42 mL/min/{1.73_m2} — ABNORMAL LOW (ref >=60)
GLUCOSE RANDOM: 106 mg/dL — ABNORMAL HIGH (ref 65–99)
POTASSIUM: 5 mmol/L (ref 3.5–5.0)
PROTEIN TOTAL: 7 g/dL (ref 6.5–8.3)
SODIUM: 143 mmol/L (ref 135–145)

## 2018-04-22 LAB — TACROLIMUS, TROUGH: Lab: 8.3

## 2018-04-22 LAB — PROTEIN / CREATININE RATIO, URINE
CREATININE, URINE: 65.1 mg/dL
PROTEIN/CREAT RATIO, URINE: 0.23

## 2018-04-22 LAB — CREATININE, URINE: Lab: 65.1

## 2018-04-22 LAB — MONOCYTES ABSOLUTE COUNT: Lab: 0.5

## 2018-04-22 LAB — MAGNESIUM: Magnesium:MCnc:Pt:Ser/Plas:Qn:: 1.7

## 2018-04-22 LAB — PHOSPHORUS: Phosphate:MCnc:Pt:Ser/Plas:Qn:: 3

## 2018-04-22 LAB — CO2: Carbon dioxide:SCnc:Pt:Ser/Plas:Qn:: 24

## 2018-04-24 DIAGNOSIS — T82858A Stenosis of vascular prosthetic devices, implants and grafts, initial encounter: Principal | ICD-10-CM

## 2018-04-24 NOTE — Unmapped (Signed)
Hi [name],     Please call your patient [name]. He has some questions about his surgery tomorrow. He states he never received the information to prepare him for his procedure.     Mr. Marcelyn Ditty can be reached at [phone] and if you require a interpreter to assist you with this call you can call [phone] opt 1 first then contact the patient.      Regards,    Marcy Panning

## 2018-04-24 NOTE — Unmapped (Signed)
Called patient with the use of [employer] [phone].    Per TPA patient called with questions about his fistula ligation tomorrow.    Called patient to update him on the procedure and that he should receive a call after 330 with information about the procedure.  Patient should be NPO after midnight.    Patient reports he got a new job and is working second shift.  He asked if he could come in the morning on 11/20 instead of the afternoon so he could make it to work.  Will move his appointment to 9 am. Patient verbalized understanding.    Patient verbalized understanding and to call with any other questions or concerns.

## 2018-04-24 NOTE — Unmapped (Signed)
See the Tourist information centre manager for documentation.  Rodney Booze

## 2018-04-25 ENCOUNTER — Encounter
Admit: 2018-04-25 | Discharge: 2018-04-25 | Payer: MEDICARE | Attending: Student in an Organized Health Care Education/Training Program | Primary: Student in an Organized Health Care Education/Training Program

## 2018-04-25 ENCOUNTER — Ambulatory Visit: Admit: 2018-04-25 | Discharge: 2018-04-25 | Payer: MEDICARE

## 2018-04-25 DIAGNOSIS — T82858A Stenosis of vascular prosthetic devices, implants and grafts, initial encounter: Principal | ICD-10-CM

## 2018-04-25 MED ORDER — OXYCODONE 5 MG TABLET
ORAL_TABLET | Freq: Four times a day (QID) | ORAL | 0 refills | 0.00000 days | Status: CP | PRN
Start: 2018-04-25 — End: 2018-05-28
  Filled 2018-04-25: qty 15, 4d supply, fill #0

## 2018-04-25 MED FILL — OXYCODONE 5 MG TABLET: 4 days supply | Qty: 15 | Fill #0 | Status: AC

## 2018-04-25 NOTE — Unmapped (Signed)
Nerve Block Discharge Instructions    General Considerations  ? Avoid placing cold, hot, hard or sharp items on your numb body part.  Also be careful to not rest your extremity on hard or hot/cold surfaces.  ? If you received a cooling device after surgery it is okay to use it.  ? Be careful not to bang or bump the numb body part.  Try to keep it in a neutral, comfortable position.  ? Elevate the limb; this will help decrease swelling and help increase comfort when the block wears off.  ? You may regain some movement or feeling, or experience some breakthrough pain, when the initial strong numbing medicine wears off.  This is normal.  ? Take your prescribed pain medicine as soon as you start feeling any discomfort in the extremity.  ? You should take your prescribed pain medicine BEFORE you go to bed, even if you are not experiencing discomfort.  If you have any questions or issues regarding the nerve block please call the anesthesia acute pain resident on call at [phone].    Upper Extremity Nerve Blocks  ? Be aware of the position of your arm and protect it from compression or other injury.  ? Ensure that your entire arm is supported, including the wrist.  ? Do not let the wrist dangle over the sling.  ? There are some possible side effects that you may experience with your particular block.  These side effects will resolve as your block wears off.   - hoarse voice - slight redness of the eye   - sagging eyelid - some mild shortness of breath (especially if lying down)   - smaller pupil    Lower Extremity Nerve Blocks  ? Be aware of the position of your leg and protect it from compression or other injury.  ? Pad the knee, ankle and heel.  ? Do NOT bear any weight on the numb extremity until you completely recover the feeling and full muscle strength.  ? Always have assistance when getting up and moving about.    Follow up Questionnaire:  A member of the regional anesthesia team will be contacting you in a day or two for follow up on your block.  Listed below are the questions they will be asking you.  Please use this sheet to help you remember the time sequence of the following events.    1. What time did you start having pain as the block wore off? _______________  2. What time were you first able to move your fingers/toes? ________________  3. Are you still experiencing any numbness in your blocked extremity? ________

## 2018-04-25 NOTE — Unmapped (Addendum)
Brief Operative Note  (CSN: [id number])      Date of Surgery: 04/25/2018    Pre-op Diagnosis: aneurysm of av fistula    Post-op Diagnosis: Arteriovenous fistula stenosis, initial encounter (CMS-HCC) [T82.858A]    Procedure(s):  LIGATION OR BANDING OF ANGIOACCESS ARTERIOVENOUS FISTULA: 98119 (CPT??)  Revision, Arteriovenous Fistula W/ Thrombectomy, Autogenous Or Nonautogenous Dialysis Graft (Sep. Proc): [phone] (CPT??)  Note: Revisions to procedures should be made in chart - see Procedures activity.    Performing Service: Transplant  Surgeon(s) and Role:     * Leona Carry, MD - Primary     * Evette Cristal, MD - Resident - Assisting    Assistant: None    Findings: aneurysmal AVF      Anesthesia: Regional    Estimated Blood Loss: 20cc    Complications: None    Specimens: None collected    Implants: * No implants in log *    Surgeon Notes: I was present and scrubbed for the entire procedure    Purcell Mouton   Date: 04/25/2018  Time: 11:47 AM

## 2018-04-25 NOTE — Unmapped (Addendum)
??    Date of Surgery: 04/25/2018  ??  Pre-op Diagnosis: aneurysm of av fistula  ??  Post-op Diagnosis: Arteriovenous fistula stenosis, initial encounter (CMS-HCC) [T82.858A]  ??  Procedure(s):  LIGATION OR BANDING OF ANGIOACCESS ARTERIOVENOUS FISTULA: 47829 (CPT??)  Revision, Arteriovenous Fistula W/ Thrombectomy, Autogenous Or Nonautogenous Dialysis Graft (Sep. Proc): [phone] (CPT??)  Note: Revisions to procedures should be made in chart - see Procedures activity.  ??  Performing Service: Transplant  Surgeon(s) and Role:     * Justin Carry, MD - Primary     * Justin Cristal, MD - Resident - Assisting  ??  Assistant: None  ??  Findings: aneurysmal AVF ; patient is post successful kidney transplant                       ??  Anesthesia: Regional  ??  Estimated Blood Loss: 20cc  ??  Complications: None  ??  Specimens: None collected  ??    PROCEDURE:  After consent obtained and arm marked, a block was performed by regional team.  ??  Brought to OR. Timeout. Prepped and draped. Timeout.   ??  Incision over origin of AVF in lower forearm and a second incision of aneurysmal degeneration of AVF.  Electrocautery used to free the AVF from origin at wrist  to close to antecubital fossa.  Ligated and divided at origin and small stump oversewn with running 5-0 prolene x 2.  High in the forearm the other end tied off with 0 silk tie x 2.   Wound had good hemostasis , Floseal placed and closed with 3-0 vicryl and staples.    Implants: * No implants in log *  ??  Surgeon Notes: I was present and scrubbed for the entire procedure  ??  Justin Mcknight   Date: 04/25/2018  Time: 11:47 AM

## 2018-04-25 NOTE — Unmapped (Signed)
RESUMEN DE LA VISITA  [facility] N??m. de expediente: [id number]    04/25/2018 -  / MAIN PERIOP [facility]  [facility]  [address]  [place] [address]  Loc: [phone]       Los siguientes pasos  --------- Hacer----------   ? Recoja estos medicamentos en [facility] CENTRAL OUT-PT PHARMACY                          oxyCODONE      ---------- Asistir---------  20 de noviembre REGRESAR A NEFROLOGIA POSTOPERATORIA 9:00 AM   Llegue sobre las 8:45 AM   Prince Rome, MD   [facility] KIDNEY TRANSPLANT [facility] [address] [place]   [address] [place] [address]   [phone]       _________________________________________________________  Instrucciones     Sus medicamentos han cambiado         EMPIECE a usar: oxyCODONE??(ROXICODONE)??       CAMBIE la [employer] de usar:    SIGA tomando sus otras medicinas    Revise la lista de medicamentos actualizada a continuaci??n.  Instrucciones sobre la alimentaci??n  Dieta despu??s del alta  Alimentaci??n normal       Otras instrucciones                            CUIDADO DE LA HERIDA:  Las heridas deben estar totalmente secas y cubiertas con vendajes quir??rgicos durante 48 horas tras la cirug??as. El medico le ha puesto un pegamento quir??rgico [employer],. Puede recortar los bordes si empiezan a enrollarse. La segundo incisi??n tiene unas presillas que se quitar??n en 3 semanas durante la cita de seguimiento. Puede poner gaza seca y cinta adhesiva de papel sobre las incisiones para proteger la ropa. Examine las hericas al [facility] veces al d??a.     Puede quitarse las bandas ACE en dos d??as (domingo).    ACTIVIDAD: No levante m??s de 10 libras ni haga actividades que requieran esfuerzo hasta qye lo vea el medico en su cita de seguimiento.    DUCHARSE: Puede ducharse pero no sumergirse en tinas ni piscinas hasta pasados 7 d??as. Use un jab??n suave y agua para lavar las heridas, no las restriegue. Seque las heridas con toques suaves.      MEDICAMENTO PARA EL DOLOR: Puede tomar Tylenol seg??n las instrucciones del frasco. Le hemos dado una receta de oxycodone, un medicamento narc??tico para el dolor fuerrte. T??melo solo si lo necesita cada 6 horas, ya que Scientific laboratory technician. Para aliviar esto puede tomar un ablandador fecal que se vende sin receta.    SEGUIMIENTO: Queremos que venga a la cl??nica cl??nica de cirug??a de acceso vascular a ver al Dr. Norma Fredrickson en 3 semnas. Si no le llaman en dos d??as, llame a la cl??nica,. Si no pudiera venir a la cita, llame para cancelarla y Charity fundraiser otra.    EMERGENCIAS: Llame a la cl??nica de cirug??a de acceso vascular si presenta los s??ntomas siguientes. Si no udiera comunicarse con la cl??nica llame a 911.  LLAME AL CIRUJANO SI SURGE ALGUNO DE LOS SIGUIENTES S??NTOMAS:  - fiebre, esto es, temperatura de m??s de 101.5 ??F (38 ??C),   - n??useas o v??mitos persistentes,  - dolor intenso incontrolable,  - enrojecimiento, dolor al tacto, mal olor o secreci??n verde o amarilla de la incisi??n, o  - cualquier s??ntoma preocupante.  Instrucciones para el alta del bloqueo nervioso      Consideraciones generales  ? Evite colocar objetos fr??os, calientes, duros o filosos en la parte del cuerpo que est?? entumecida. Tenga tambi??n cuidado de no apoyar la extremidad sobre superficies duras, fr??as o calientes.  ? Est?? bien utilizar un aparato de enfriamiento si lo recibe despu??s de la cirug??a.  ? Tenga cuidado de no golpear o sacudir la parte del cuerpo que est?? entumecida. Trate de mantenerla en Neomia Dear posici??n neutral y c??moda.  ? Eleve la extremidad. Esto ayudar?? a disminuir la hinchaz??n y aumentar?? la comodidad cuando pase el efecto del bloqueador.  ? Pudiera recobrar algo de movimiento o sensaci??n o sentir dolor intercurrente, cuando pase el efecto de la potente anestesia inicial. Esto es normal.  ? Tome el medicamento recetado para Chief Technology Officer en cuanto comience a Veterinary surgeon extremidad.  ? Deber??a tomar el medicamento recetado para el dolor ANTES de irse a dormir, aunque no est?? sintiendo Dentist.  Para cualquier pregunta o problema sobre el bloqueo nervioso, llame por favor al residente especialista en anestesia para dolores agudos de turno al [phone].    Bloqueo nervioso en las extremidades superiores  ? Est?? al tanto de la posici??n del brazo y evite la compresi??n u otra lesi??n.  ? Aseg??rese de que el brazo entero est?? sostenido, inclusive la mu??eca.  ? No deje que la mu??eca cuelgue del cabestrillo.  ? Es posible que tenga algunos efectos secundarios que con este bloqueo en particular. Estos efectos secundarios disminuir??n en la medida que pase el efecto del bloqueo.   - voz ronca  - p??rpados ca??dos  - leve enrojecimiento del ojo  - pupilas m??s peque??as - leve falta de aire (especialmente al acostarse)    Bloqueo neural en las extremidades inferiores  ? Est?? al tanto de la posici??n de la pierna y evite la compresi??n u otra lesi??n.  ? Acolchone la rodilla, el tobillo y el tal??n.  ? NO coloque nada de peso en la extremidad entumecida hasta que recupere por completo la sensaci??n y toda la fuerza muscular.  ? Siempre cuente con ayuda al levantarse y [place].    Cuestionario de seguimiento:  Un miembro del equipo de anestesia regional se comunicar?? con usted en [employer] d??as para hacer un seguimiento sobre el bloqueo. A continuaci??n se incluyen las preguntas que se le har??n. Utilice por favor esta hoja para ayudarlo a recordar la secuencia de los siguientes hechos:    1. ??A qu?? hora comenz?? a Financial risk analyst en la medida que pasaba el efecto del bloqueo? ________  2. ??A qu?? hora pudo mover los dedos de las manos o los pies por primera vez? ______________  3. ??Todav??a siente entumecimiento en la extremidad que bloquearon? _____________________  Resumen de la terapia  L??nea del diagrama de flujo    Valor m??s reciente  Recomendaciones para fisioterapia despu??s del alta   Recomendaciones para equipo m??dico duradero     Seguimiento/frecuencia de fisioterapia por           Seguimiento        Motivo de la hospitalizaci??n  Su diagn??stico primario fue: Estenosis de fistual arterovenosa   Sus diagn??sticos tambi??n incluyeron:      M??dicos que lo atendieron durante la hospitalizaci??n  Proveedor Servicio Funci??n Especialidad   Leona Carry, MD  -- Proveedor a cargo Cirug??a general     Es al??rgico a lo siguiente  Al??rgeno Reacci??n   Codeine Hinchaz??n  MyChart  ??Env??e mensajes al m??dico, revise los resultados de [place]erson m??dicas, renueve las recetas, haga citas y Arvella Merles m??s!    Vaya a [email or web address] y haga clic en ??Activate Your Account??. [place] su c??digo de activaci??n de My [place] Chart exactamente como aparece a continuaci??n junto con su fecha de nacimiento para completar el proceso de activaci??n.     Mi c??digo de activaci??n de My [place] Chart: [id number]    No tiene fecha de vencimiento    Si necesita ayuda con My [place] Chart, llame a [facility] al [phone].    Care Everywhere CEID  [phone]: Este n??mero de identificaci??n se puede usar si otra instalaci??n m??dica que [employer] el programa Epic necesita solicitar el expediente m??dico de [place].      Lista de medicamentos      Por la ma??ana Por la tarde Por la noche A la hora de irse a dormir Cuando sea necesario   amLODIPine 5 MG tablet   Com??nmente conocido como: NORVASC   Tomar 1 tableta (5 mg total) tableta por v??a oral en la noche.             atorvastatin 10 MG tablet   Com??nmente conocido como: LIPITOR   Tome una tableta (10 mg en total) por v??a oral diariamente.              MYFORTIC 180 MG EC tablet   Tomar 1 tableta (5 mg en total) por v??a oral cada 6 horas si lo necesita para el dolor.               COMIENCE A TOMAR  oxyCODONE 5 MG   Com??nmente conocido como: ROXICODONE   Tome 1 tableta (5mg  en total) por v??a oral cada seis horas si lo necesita para el dolor.               PROGRAF 1 MG capsule   Tome 3 c??psulas (3 mg en total) por v??a oral dos 2 veces al d??a               Corporate investment banker medicamentos en [facility] CENTRAL OUT-PT PHARMACY      oxyCODONE   Direcci??n: [address], [place] [address]   Horario: 7 AM to 8 PM Monday - Friday, 8:30 AM to 4 PM Saturday and Sunday (Discharges Only)   Tel: [phone]            Informaci??n adjunta    Informaci??n de recursos ante una crisis:  L??neas directas nacionales de prevenci??n del suicidio:  1-800-SUICIDE [phone] en espa??ol o 1-800-273-TALK [phone]) en ingl??s    L??neas de atenci??n ante Neomia Dear crisis de [place]:   [phone]              [facility] N??m. de expediente: [phone]     CSN: [id number]   SA: [facility] SERVICE AREA Report:-IP After Visit Summary      Al Sallye Ober, yo reconozco que recib?? y entiendo las instrucciones del alta precedentes y materiales educativos para el paciente adjuntos (si los hay).   By signing below, I acknowledge that I have received and understand the foregoing discharge instructions and accompanying patient education materials (if any).      ____________________________________________________________________  Chales Salmon del paciente/representante autorizado/adulto responsable  Signature of Patient/Authorized Representative/Responsible Adult      Nombre en letra de imprenta y relaci??n con el paciente: _________________________________________________ Printed Name and Relationship to Patient  Franco Nones y hora: ___________________________________________________________________  Date and Time      Firma de la enfermera u otro proveedor: ________________________________________________________________   Signature of Nurse or Other Provider      Nombre en letra de imprenta y credenciales: ____________________________________________________________   Printed Name and Credentials      Fecha y hora: _____________________________________________________________________  Date and Time

## 2018-04-28 NOTE — Unmapped (Signed)
Addendum  created 04/28/18 1400 by Ky Barban, MD    Clinical Note Signed, Intraprocedure Event edited

## 2018-04-29 ENCOUNTER — Ambulatory Visit: Admit: 2018-04-29 | Discharge: 2018-04-30 | Payer: MEDICARE

## 2018-04-29 DIAGNOSIS — Z940 Kidney transplant status: Principal | ICD-10-CM

## 2018-04-29 LAB — CBC W/ AUTO DIFF
BASOPHILS ABSOLUTE COUNT: 0.1 10*9/L (ref 0.0–0.1)
EOSINOPHILS ABSOLUTE COUNT: 0.2 10*9/L (ref 0.0–0.4)
EOSINOPHILS RELATIVE PERCENT: 2.7 %
HEMATOCRIT: 51.2 % (ref 41.0–53.0)
HEMOGLOBIN: 15.9 g/dL (ref 13.5–17.5)
LYMPHOCYTES ABSOLUTE COUNT: 1 10*9/L — ABNORMAL LOW (ref 1.5–5.0)
LYMPHOCYTES RELATIVE PERCENT: 17.1 %
MEAN CORPUSCULAR HEMOGLOBIN CONC: 31.1 g/dL (ref 31.0–37.0)
MEAN CORPUSCULAR HEMOGLOBIN: 29.4 pg (ref 26.0–34.0)
MEAN CORPUSCULAR VOLUME: 94.6 fL (ref 80.0–100.0)
MONOCYTES ABSOLUTE COUNT: 0.3 10*9/L (ref 0.2–0.8)
MONOCYTES RELATIVE PERCENT: 4.8 %
NEUTROPHILS ABSOLUTE COUNT: 4.4 10*9/L (ref 2.0–7.5)
NEUTROPHILS RELATIVE PERCENT: 71.6 %
PLATELET COUNT: 195 10*9/L (ref 150–440)
RED CELL DISTRIBUTION WIDTH: 15.5 % — ABNORMAL HIGH (ref 12.0–15.0)
WBC ADJUSTED: 6.1 10*9/L (ref 4.5–11.0)

## 2018-04-29 LAB — PROTEIN / CREATININE RATIO, URINE
CREATININE, URINE: 49 mg/dL
PROTEIN URINE: 9.2 mg/dL

## 2018-04-29 LAB — NEUTROPHILS ABSOLUTE COUNT: Lab: 4.4

## 2018-04-29 LAB — RENAL FUNCTION PANEL
ALBUMIN: 4.6 g/dL (ref 3.5–5.0)
ANION GAP: 11 mmol/L (ref 7–15)
BLOOD UREA NITROGEN: 22 mg/dL — ABNORMAL HIGH (ref 7–21)
BUN / CREAT RATIO: 14
CALCIUM: 10.3 mg/dL — ABNORMAL HIGH (ref 8.5–10.2)
CHLORIDE: 107 mmol/L (ref 98–107)
CREATININE: 1.62 mg/dL — ABNORMAL HIGH (ref 0.70–1.30)
EGFR CKD-EPI AA MALE: 52 mL/min/{1.73_m2} — ABNORMAL LOW (ref >=60)
EGFR CKD-EPI NON-AA MALE: 45 mL/min/{1.73_m2} — ABNORMAL LOW (ref >=60)
PHOSPHORUS: 2.9 mg/dL (ref 2.9–4.7)
POTASSIUM: 5.3 mmol/L — ABNORMAL HIGH (ref 3.5–5.0)
SODIUM: 145 mmol/L (ref 135–145)

## 2018-04-29 LAB — PROTEIN/CREAT RATIO, URINE: Protein/Creatinine:MRto:Pt:Urine:Qn:: 0.188

## 2018-04-29 LAB — CALCIUM: Calcium:MCnc:Pt:Ser/Plas:Qn:: 10.3 — ABNORMAL HIGH

## 2018-04-29 LAB — MAGNESIUM: Magnesium:MCnc:Pt:Ser/Plas:Qn:: 1.6

## 2018-04-29 LAB — TACROLIMUS, TROUGH: Lab: 8.2

## 2018-05-01 ENCOUNTER — Ambulatory Visit: Admit: 2018-05-01 | Discharge: 2018-05-02 | Payer: MEDICARE | Attending: Surgery | Primary: Surgery

## 2018-05-01 DIAGNOSIS — T82898A Other specified complication of vascular prosthetic devices, implants and grafts, initial encounter: Principal | ICD-10-CM

## 2018-05-01 MED ORDER — CEPHALEXIN 500 MG CAPSULE
ORAL_CAPSULE | Freq: Three times a day (TID) | ORAL | 0 refills | 0 days | Status: CP
Start: 2018-05-01 — End: 2018-05-05
  Filled 2018-05-01: qty 30, 10d supply, fill #0

## 2018-05-01 MED ORDER — AMLODIPINE 10 MG TABLET
ORAL_TABLET | Freq: Every day | ORAL | 2 refills | 0.00000 days
Start: 2018-05-01 — End: 2018-05-28

## 2018-05-01 MED FILL — CEPHALEXIN 500 MG CAPSULE: 10 days supply | Qty: 30 | Fill #0 | Status: AC

## 2018-05-01 NOTE — Unmapped (Signed)
Post op visit    AVF ligation and aneursym resection  Increased pain at origin of AVF with small mass/hematoma a t site of AVF stump; it is pulsatile  Some thenar superficial numbness, no motor deficients    BP 119/80  - Pulse 106  - Temp 36.1 ??C (97 ??F) (Tympanic)  - Ht 188 cm (6' 2)  - Wt 82.1 kg (181 lb 1.6 oz)  - SpO2 99%  - BMI 23.25 kg/m??    ~2-3cmx2-3cm tender mass, some of this is the stump of AVf     Plan  Ancef 1 week  Return in 4 days to assess mass if expanding or stable

## 2018-05-01 NOTE — Unmapped (Signed)
[facility] (patient's PCP) called; patient came into their clinic with vascular access ligation site very red and edematous. Advised to come in to clinic. Dr. Norma Fredrickson and Alberteen Spindle, RN notified

## 2018-05-02 NOTE — Unmapped (Signed)
Vascular Access for Dialysis Note:  Justin Mcknight  Photograph taken of surgical wound during clinic visit on 05/01/18 at 1422 uploaded to EMR Epic Media on 05/02/18 at 1501.

## 2018-05-05 ENCOUNTER — Ambulatory Visit: Admit: 2018-05-05 | Discharge: 2018-05-06 | Payer: MEDICARE | Attending: Surgery | Primary: Surgery

## 2018-05-05 DIAGNOSIS — T82898A Other specified complication of vascular prosthetic devices, implants and grafts, initial encounter: Principal | ICD-10-CM

## 2018-05-05 MED ORDER — AMOXICILLIN 875 MG-POTASSIUM CLAVULANATE 125 MG TABLET
ORAL_TABLET | Freq: Two times a day (BID) | ORAL | 0 refills | 0 days | Status: CP
Start: 2018-05-05 — End: 2018-05-28
  Filled 2018-05-05: qty 14, 7d supply, fill #0

## 2018-05-05 MED FILL — AMOXICILLIN 875 MG-POTASSIUM CLAVULANATE 125 MG TABLET: 7 days supply | Qty: 14 | Fill #0 | Status: AC

## 2018-05-05 NOTE — Unmapped (Signed)
Order for Augmentin 875 mg bid X 7 days placed to [facility] outpatient pharmacy.  Pt seen by [facility], MD and Dr Langston Masker for post op follow up distal incisional swelling, appears unchanged from 05/01/18, perhaps a bit more redness. DOS was 04/25/18. Ancef d/c'd. Pt to return 05/12/18 @1030 .

## 2018-05-05 NOTE — Unmapped (Signed)
Post op visit    S:  Justin Mcknight is a 62 yo M who presents in follow up s/p AVF ligation and aneursym resection on 04/25/2018    He presented one four days ago for post-op eval at which time he was noted to have swelling overlying the most distal incision at the wrist.  Given c/f erythema, he was placed on oral Ancef for one week.  He has been taking this regularly and on time.      Today, he reports continued tenderness in the same distribution as last week, however not increased.  He is still having numbness in the radial n. distribution of the left hand (thenar aspect).  No fevers or chills at home.  No drainage from incision.  His incision proximal to the [facility] fossa is without pain, redness or drainage and he has no complaints about this.      O:  BP 137/101  - Pulse 95  - Temp 36.8 ??C (Tympanic)  - Ht 188 cm (6' 2.02)  - Wt 82.8 kg (182 lb 8 oz)  - BMI 23.42 kg/m??    ~2-3cmx2-3cm tender mass; pulsatile.  No drainage.  Increased erythema from last week overlying the mass, this does not track.  Upper incision c/d/i with staples in place.    A/P  C/f hematoma at the distal incision +/- cellulitis; no active extravasation or purulent drainage; stable in appearance/size though with some mildly increased erythema    - Will switch to Augmentin for 1 week  - Repeat visit in 1 week for ongoing surveillance

## 2018-05-05 NOTE — Unmapped (Signed)
[facility] Refill Coordination Note    Specialty Medication(s) to be Shipped:   Transplant: Myfortic 180mg  and Prograf 1mg     Other medication(s) to be shipped:   amLODIPine (NORVASC) 5 MG tablet   atorvastatin (LIPITOR) 10 MG        [facility] [place]: Dec 23, 1955  Phone: [phone] (home)       All above HIPAA information was verified with patient.     Completed refill call assessment today to schedule patient's medication shipment from the [facility] [phone]).       Specialty medication(s) and dose(s) confirmed: Regimen is correct and unchanged.   Changes to medications: Keldric reports no changes reported at this time.  Changes to insurance: No  Questions for the pharmacist: No    The patient will receive a drug information handout for each medication shipped and additional FDA Medication Guides as required.      DISEASE/MEDICATION-SPECIFIC INFORMATION        N/A    ADHERENCE     Medication Adherence    Patient reported X missed doses in the last month:  0          Support network for adherence:  family member                  MEDICARE PART B DOCUMENTATION     Myfortic 180mg : Patient has 4 days worth of tablets on hand.  Prograf 1mg : Patient has 4 days worth of capsules on hand.    SHIPPING     Shipping address confirmed in Epic.     Delivery Scheduled: Yes, Expected medication delivery date: 05/08/18 via UPS or courier.     Medication will be delivered via UPS to the home address in Epic WAM.    [place] A Deunta Beneke   [facility] Specialty Technician

## 2018-05-05 NOTE — Unmapped (Signed)
Patient c/o pain in left arm

## 2018-05-07 ENCOUNTER — Ambulatory Visit: Admit: 2018-05-07 | Discharge: 2018-05-08 | Payer: MEDICARE

## 2018-05-07 DIAGNOSIS — Z940 Kidney transplant status: Principal | ICD-10-CM

## 2018-05-07 LAB — CBC W/ AUTO DIFF
BASOPHILS ABSOLUTE COUNT: 0.1 10*9/L (ref 0.0–0.1)
BASOPHILS RELATIVE PERCENT: 1.3 %
EOSINOPHILS ABSOLUTE COUNT: 0.1 10*9/L (ref 0.0–0.4)
HEMATOCRIT: 52.6 % (ref 41.0–53.0)
HEMOGLOBIN: 16.2 g/dL (ref 13.5–17.5)
LYMPHOCYTES ABSOLUTE COUNT: 1 10*9/L — ABNORMAL LOW (ref 1.5–5.0)
LYMPHOCYTES RELATIVE PERCENT: 17.8 %
MEAN CORPUSCULAR HEMOGLOBIN CONC: 30.8 g/dL — ABNORMAL LOW (ref 31.0–37.0)
MEAN CORPUSCULAR HEMOGLOBIN: 29 pg (ref 26.0–34.0)
MEAN CORPUSCULAR VOLUME: 94.4 fL (ref 80.0–100.0)
MEAN PLATELET VOLUME: 8.2 fL (ref 7.0–10.0)
MONOCYTES ABSOLUTE COUNT: 0.8 10*9/L (ref 0.2–0.8)
MONOCYTES RELATIVE PERCENT: 13.2 %
NEUTROPHILS ABSOLUTE COUNT: 3.7 10*9/L (ref 2.0–7.5)
NEUTROPHILS RELATIVE PERCENT: 64.4 %
PLATELET COUNT: 223 10*9/L (ref 150–440)
RED BLOOD CELL COUNT: 5.57 10*12/L (ref 4.50–5.90)
RED CELL DISTRIBUTION WIDTH: 15.4 % — ABNORMAL HIGH (ref 12.0–15.0)
WBC ADJUSTED: 5.7 10*9/L (ref 4.5–11.0)

## 2018-05-07 LAB — RENAL FUNCTION PANEL
ALBUMIN: 4.5 g/dL (ref 3.5–5.0)
ANION GAP: 10 mmol/L (ref 7–15)
BLOOD UREA NITROGEN: 24 mg/dL — ABNORMAL HIGH (ref 7–21)
BUN / CREAT RATIO: 15
CHLORIDE: 105 mmol/L (ref 98–107)
CREATININE: 1.56 mg/dL — ABNORMAL HIGH (ref 0.70–1.30)
EGFR CKD-EPI AA MALE: 55 mL/min/{1.73_m2} — ABNORMAL LOW (ref >=60)
EGFR CKD-EPI NON-AA MALE: 47 mL/min/{1.73_m2} — ABNORMAL LOW (ref >=60)
GLUCOSE RANDOM: 116 mg/dL (ref 65–179)
PHOSPHORUS: 3.1 mg/dL (ref 2.9–4.7)
POTASSIUM: 4.8 mmol/L (ref 3.5–5.0)
SODIUM: 141 mmol/L (ref 135–145)

## 2018-05-07 LAB — PROTEIN / CREATININE RATIO, URINE
CREATININE, URINE: 72 mg/dL
PROTEIN/CREAT RATIO, URINE: 0.211

## 2018-05-07 LAB — MAGNESIUM: Magnesium:MCnc:Pt:Ser/Plas:Qn:: 1.6

## 2018-05-07 LAB — CREATININE, URINE: Lab: 72

## 2018-05-07 LAB — POTASSIUM: Potassium:SCnc:Pt:Ser/Plas:Qn:: 4.8

## 2018-05-07 LAB — BASOPHILS RELATIVE PERCENT: Lab: 1.3

## 2018-05-07 LAB — TACROLIMUS, TROUGH: Lab: 8.9

## 2018-05-07 MED FILL — ATORVASTATIN 10 MG TABLET: ORAL | 30 days supply | Qty: 30 | Fill #2

## 2018-05-07 MED FILL — MYFORTIC 180 MG TABLET,DELAYED RELEASE: ORAL | 30 days supply | Qty: 180 | Fill #2

## 2018-05-07 MED FILL — AMLODIPINE 5 MG TABLET: ORAL | 30 days supply | Qty: 30 | Fill #2

## 2018-05-07 MED FILL — ATORVASTATIN 10 MG TABLET: 30 days supply | Qty: 30 | Fill #2 | Status: AC

## 2018-05-07 MED FILL — PROGRAF 1 MG CAPSULE: 30 days supply | Qty: 180 | Fill #2 | Status: AC

## 2018-05-07 MED FILL — MYFORTIC 180 MG TABLET,DELAYED RELEASE: 30 days supply | Qty: 180 | Fill #2 | Status: AC

## 2018-05-07 MED FILL — PROGRAF 1 MG CAPSULE: ORAL | 30 days supply | Qty: 180 | Fill #2

## 2018-05-07 MED FILL — AMLODIPINE 5 MG TABLET: 30 days supply | Qty: 30 | Fill #2 | Status: AC

## 2018-05-12 ENCOUNTER — Ambulatory Visit: Admit: 2018-05-12 | Discharge: 2018-05-12 | Payer: MEDICARE

## 2018-05-12 DIAGNOSIS — T82898D Other specified complication of vascular prosthetic devices, implants and grafts, subsequent encounter: Principal | ICD-10-CM

## 2018-05-12 NOTE — Unmapped (Signed)
VASCULAR ACCESS CLINIC    Assessment/Plan  [facility] presented to clinic for vascular access follow up s/p AVF ligation and aneursym resection on 04/25/2018.    Staples removed from proximal forearm incision, replaced with steri strips.     With regard to area of concern near the anastomosis ligation site, it remains approximately the same size, possibly a bit harder indicating breakdown of hematoma and less concerning for worsening aneurysm/pseudoaneurysm. Completed augmentin course with no worsening cellulitis. Still some localized erythema and tenderness to palpation    Follow up next week. If any larger, patient will call Legrand Como, and will schedule [place] of extremity.       Subjective  Justin Mcknight is a 62 y.o. male who presents for vascular access follow up. He is s/p left forearm AVF ligation and aneurysm resection 04/25/2018.  He presented for initial post-op eval 10/24, at which time he was noted to have swelling overlying the most distal incision at the wrist.  Given c/f erythema, he was placed on oral Ancef for one week. At subsequent follow up, 10/28, this area of concern was largely unchanged but having some pain/numbness to hand. His abx course was changed to Augmentin and presents for follow up 1 week later.    He still reports intermittent pressure/pain to wrist area and is mainly utilizing tylenol. No significant changes to fistula appearance per patient. No consitutional symptoms or fever.    Allergies  Codeine    Medications    Current Outpatient Medications   Medication Sig Dispense Refill   ??? amLODIPine (NORVASC) 10 MG tablet take 1 tablet by mouth once daily 30 tablet 2   ??? amLODIPine (NORVASC) 5 MG tablet Take 1 tablet (5 mg total) by mouth every evening. 30 tablet 11   ??? amoxicillin-clavulanate (AUGMENTIN) 875-125 mg per tablet Take 1 tablet by mouth Two (2) times a day. for 7 days 14 tablet 0   ??? atorvastatin (LIPITOR) 10 MG tablet Take 1 tablet (10 mg total) by mouth daily. 30 tablet 11   ??? MYFORTIC 180 mg EC tablet Take 3 tablets (540mg ) by mouth 2 times daily 180 tablet 11   ??? oxyCODONE (ROXICODONE) 5 MG immediate release tablet Take 1 tablet (5 mg total) by mouth every six (6) hours as needed for pain. 15 tablet 0   ??? PROGRAF 1 mg capsule Take 3 capsules (3 mg total) by mouth two (2) times a day. 240 capsule 11     No current facility-administered medications for this visit.        Past Medical History  Past Medical History:   Diagnosis Date   ??? Anemia of chronic disease    ??? Cough 11/04/2012   ??? End stage renal disease on dialysis (CMS-HCC)     started 09/2010   ??? Gout    ??? Hepatic steatosis    ??? Hypertension    ??? Patient awaiting renal transplant 09/03/2017   ??? Renal cell carcinoma (CMS-HCC)     03/2011   ??? Secondary hyperparathyroidism of renal origin (CMS-HCC)    ??? Shortness of breath 11/04/2012       Past Surgical History  Past Surgical History:   Procedure Laterality Date   ??? DIALYSIS FISTULA CREATION      LUE   ??? KIDNEY SURGERY      right  removed   ??? PR AV FIST REVISE GRFT,W THROMBECTOMY Left 04/25/2018    Procedure: Revision, Arteriovenous Fistula W/ Thrombectomy, Autogenous Or Nonautogenous  Dialysis Graft (Sep. Proc);  Surgeon: Leona Carry, MD;  [facility];  Service: Transplant   ??? PR COLSC FLX W/RMVL OF TUMOR POLYP LESION SNARE TQ Left 01/21/2013    Procedure: COLONOSCOPY FLEX; W/REMOV TUMOR/LES BY SNARE;  Surgeon: Romero Belling, MD;  [facility];  Service: Gastroenterology   ??? PR LIGATN ANGIOACCESS AV FISTULA Left 04/25/2018    Procedure: LIGATION OR BANDING OF ANGIOACCESS ARTERIOVENOUS FISTULA;  Surgeon: Leona Carry, MD;  Location: MAIN OR [facility];  Service: Transplant   ??? PR TRANSPLANTATION OF KIDNEY Right 09/03/2017    Procedure: RENAL ALLOTRANSPLANTATION, IMPLANTATION OF GRAFT; WITHOUT RECIPIENT NEPHRECTOMY;  Surgeon: Doyce Loose, MD;  Location: MAIN OR [facility];  Service: Transplant       Review of Systems A 12 system review of systems was negative except as noted in HPI    Objective  Blood pressure 134/91, pulse 91, temperature 36.4 ??C (97.5 ??F), temperature source Oral, height 188 cm (6' 2.02), weight 83.2 kg (183 lb 6.4 oz), SpO2 96 %. Body mass index is 23.54 kg/m??.    Lungs: clear to auscultation, percussion to the bases, and unlabored breathing   Heart: euvolemic, regular rate and rhythm, normal S1 and S2, no murmur  Ext: small hematoma at the distal incision, Still ~2-3cm diameter and pulsatile. no active extravasation or purulent drainage; stable in appearance overall with some erythema. Upper incision c/d/i with staples in place.   Neuro: grossly intact     Test Results  Lab Results   Component Value Date    WBC 5.7 05/07/2018    HGB 16.2 05/07/2018    HCT 52.6 05/07/2018    PLT 223 05/07/2018     Lab Results   Component Value Date    NA 141 05/07/2018    K 4.8 05/07/2018    CL 105 05/07/2018    CO2 26.0 05/07/2018    BUN 24 (H) 05/07/2018    CREATININE 1.56 (H) 05/07/2018    CALCIUM 10.0 05/07/2018    MG 1.6 05/07/2018    PHOS 3.1 05/07/2018       Imaging/Vein Mapping:    None today     _______________________________________    Gertie Fey, DNP, APRN, FNP-C  [facility]  [address]  [place], [place]  [address]

## 2018-05-12 NOTE — Unmapped (Signed)
Vascular Access for Dialysis Note:  Justin Mcknight [facility]  Pt in clinic, 2 nd post op, AVG ligation and aneurysm resection on 04/25/18, has small area of swelling near distal incision, see image in media dated 05/02/18. Ancef 1 wk and Augmenting 1 week complete.  See by Adline Peals, DNP and Sandria Bales, MD.  Today staples removed from remaining incision, steristrips applied, to take acetaminophen for pain, to call if any increased pain, new onset fever or any other questions. Pt to return to clinic 05/22/18.

## 2018-05-13 ENCOUNTER — Ambulatory Visit: Admit: 2018-05-13 | Discharge: 2018-05-14 | Payer: MEDICARE

## 2018-05-13 DIAGNOSIS — Z940 Kidney transplant status: Principal | ICD-10-CM

## 2018-05-13 LAB — RENAL FUNCTION PANEL
ALBUMIN: 4.6 g/dL (ref 3.5–5.0)
ANION GAP: 10 mmol/L (ref 7–15)
BUN / CREAT RATIO: 14
CALCIUM: 10.2 mg/dL (ref 8.5–10.2)
CHLORIDE: 104 mmol/L (ref 98–107)
CO2: 28 mmol/L (ref 22.0–30.0)
CREATININE: 1.57 mg/dL — ABNORMAL HIGH (ref 0.70–1.30)
EGFR CKD-EPI NON-AA MALE: 47 mL/min/{1.73_m2} — ABNORMAL LOW (ref >=60)
GLUCOSE RANDOM: 108 mg/dL (ref 65–179)
PHOSPHORUS: 3.3 mg/dL (ref 2.9–4.7)
POTASSIUM: 5.1 mmol/L — ABNORMAL HIGH (ref 3.5–5.0)
SODIUM: 142 mmol/L (ref 135–145)

## 2018-05-13 LAB — CBC W/ AUTO DIFF
BASOPHILS ABSOLUTE COUNT: 0.2 10*9/L — ABNORMAL HIGH (ref 0.0–0.1)
BASOPHILS RELATIVE PERCENT: 4.5 %
EOSINOPHILS ABSOLUTE COUNT: 0.1 10*9/L (ref 0.0–0.4)
EOSINOPHILS RELATIVE PERCENT: 2.6 %
HEMATOCRIT: 49.4 % (ref 41.0–53.0)
HEMOGLOBIN: 16 g/dL (ref 13.5–17.5)
LYMPHOCYTES ABSOLUTE COUNT: 0.9 10*9/L — ABNORMAL LOW (ref 1.5–5.0)
LYMPHOCYTES RELATIVE PERCENT: 16.9 %
MEAN CORPUSCULAR HEMOGLOBIN: 29.1 pg (ref 26.0–34.0)
MEAN CORPUSCULAR VOLUME: 89.8 fL (ref 80.0–100.0)
MEAN PLATELET VOLUME: 11.3 fL — ABNORMAL HIGH (ref 7.0–10.0)
MONOCYTES ABSOLUTE COUNT: 0.4 10*9/L (ref 0.2–0.8)
MONOCYTES RELATIVE PERCENT: 7.1 %
NEUTROPHILS ABSOLUTE COUNT: 3.5 10*9/L (ref 2.0–7.5)
NEUTROPHILS RELATIVE PERCENT: 68.9 %
PLATELET COUNT: 209 10*9/L (ref 150–440)
RED BLOOD CELL COUNT: 5.5 10*12/L (ref 4.50–5.90)
WBC ADJUSTED: 5.1 10*9/L (ref 4.5–11.0)

## 2018-05-13 LAB — SLIDE REVIEW

## 2018-05-13 LAB — SMEAR REVIEW

## 2018-05-13 LAB — MAGNESIUM: Magnesium:MCnc:Pt:Ser/Plas:Qn:: 1.6

## 2018-05-13 LAB — TACROLIMUS, TROUGH: Lab: 9.3

## 2018-05-13 LAB — CREATININE, URINE: Lab: 66.2

## 2018-05-13 LAB — CREATININE: Creatinine:MCnc:Pt:Ser/Plas:Qn:: 1.57 — ABNORMAL HIGH

## 2018-05-13 LAB — LYMPHOCYTES RELATIVE PERCENT: Lab: 16.9

## 2018-05-20 ENCOUNTER — Ambulatory Visit: Admit: 2018-05-20 | Discharge: 2018-05-21 | Payer: MEDICARE

## 2018-05-20 DIAGNOSIS — Z940 Kidney transplant status: Principal | ICD-10-CM

## 2018-05-20 DIAGNOSIS — Z79899 Other long term (current) drug therapy: Secondary | ICD-10-CM

## 2018-05-20 LAB — CBC W/ AUTO DIFF
BASOPHILS ABSOLUTE COUNT: 0 10*9/L (ref 0.0–0.1)
BASOPHILS RELATIVE PERCENT: 0.7 %
EOSINOPHILS ABSOLUTE COUNT: 0.1 10*9/L (ref 0.0–0.4)
HEMATOCRIT: 51.5 % (ref 41.0–53.0)
HEMOGLOBIN: 15.7 g/dL (ref 13.5–17.5)
LARGE UNSTAINED CELLS: 2 % (ref 0–4)
LYMPHOCYTES RELATIVE PERCENT: 19.2 %
MEAN CORPUSCULAR HEMOGLOBIN CONC: 30.6 g/dL — ABNORMAL LOW (ref 31.0–37.0)
MEAN CORPUSCULAR VOLUME: 94.5 fL (ref 80.0–100.0)
MEAN PLATELET VOLUME: 8.1 fL (ref 7.0–10.0)
MONOCYTES ABSOLUTE COUNT: 0.5 10*9/L (ref 0.2–0.8)
MONOCYTES RELATIVE PERCENT: 9.1 %
NEUTROPHILS ABSOLUTE COUNT: 3.7 10*9/L (ref 2.0–7.5)
NEUTROPHILS RELATIVE PERCENT: 67.9 %
PLATELET COUNT: 199 10*9/L (ref 150–440)
RED BLOOD CELL COUNT: 5.45 10*12/L (ref 4.50–5.90)
RED CELL DISTRIBUTION WIDTH: 15.2 % — ABNORMAL HIGH (ref 12.0–15.0)
WBC ADJUSTED: 5.5 10*9/L (ref 4.5–11.0)

## 2018-05-20 LAB — COMPREHENSIVE METABOLIC PANEL
ALBUMIN: 4.5 g/dL (ref 3.5–5.0)
ALKALINE PHOSPHATASE: 78 U/L (ref 38–126)
ALT (SGPT): 34 U/L (ref <50)
ANION GAP: 7 mmol/L (ref 7–15)
AST (SGOT): 36 U/L (ref 19–55)
BILIRUBIN TOTAL: 0.5 mg/dL (ref 0.0–1.2)
BLOOD UREA NITROGEN: 22 mg/dL — ABNORMAL HIGH (ref 7–21)
BUN / CREAT RATIO: 13
CALCIUM: 10.2 mg/dL (ref 8.5–10.2)
CHLORIDE: 106 mmol/L (ref 98–107)
CO2: 27 mmol/L (ref 22.0–30.0)
CREATININE: 1.69 mg/dL — ABNORMAL HIGH (ref 0.70–1.30)
EGFR CKD-EPI AA MALE: 50 mL/min/{1.73_m2} — ABNORMAL LOW (ref >=60)
EGFR CKD-EPI NON-AA MALE: 43 mL/min/{1.73_m2} — ABNORMAL LOW (ref >=60)
GLUCOSE RANDOM: 109 mg/dL (ref 65–179)
POTASSIUM: 4.8 mmol/L (ref 3.5–5.0)
SODIUM: 140 mmol/L (ref 135–145)

## 2018-05-20 LAB — PHOSPHORUS: Phosphate:MCnc:Pt:Ser/Plas:Qn:: 2.9

## 2018-05-20 LAB — CHLORIDE: Chloride:SCnc:Pt:Ser/Plas:Qn:: 106

## 2018-05-20 LAB — PLATELET COUNT: Lab: 199

## 2018-05-20 LAB — MAGNESIUM: Magnesium:MCnc:Pt:Ser/Plas:Qn:: 1.5 — ABNORMAL LOW

## 2018-05-20 LAB — PROTEIN/CREAT RATIO, URINE: Protein/Creatinine:MRto:Pt:Urine:Qn:: 0.731

## 2018-05-20 LAB — TACROLIMUS, TROUGH: Lab: 7.2

## 2018-05-20 LAB — PROTEIN / CREATININE RATIO, URINE: PROTEIN URINE: 44.4 mg/dL

## 2018-05-22 ENCOUNTER — Ambulatory Visit: Admit: 2018-05-22 | Discharge: 2018-05-23 | Payer: MEDICARE | Attending: Surgery | Primary: Surgery

## 2018-05-22 DIAGNOSIS — T82898D Other specified complication of vascular prosthetic devices, implants and grafts, subsequent encounter: Principal | ICD-10-CM

## 2018-05-22 NOTE — Unmapped (Signed)
Justin Mcknight, Justin Mcknight is in the [facility] today to see Dr Norma Fredrickson  Reason for visit:for Justin access follow up s/p Left forearm AVF ligation and aneursym resection on 04/25/2018.    Site looks unchanged from last visit. Only slightly compressable, no drainage. Denies fevers or chills. Photograph taken today for comparison loaded to Media tab in Epic. Pt has taken courses of antibiotics.     Plan: patient to return in three weeks    I spent 10 minutes with [facility] and 100% of the time was spent on education including care and expectations of Justin access.

## 2018-05-26 NOTE — Unmapped (Signed)
Vascular Access for Dialysis Note:  Justin Mcknight was discussed in Vascular Access meeting with Dr Norma Fredrickson.   Plan: Will see pt at return visit on 06/12/18. If no improvement will send for PVL study fistula for hematoma vs pseudoaneurysm at access origin site

## 2018-05-26 NOTE — Unmapped (Signed)
[facility] Refill Coordination Note    Specialty Medication(s) to be Shipped:   Transplant: Myfortic 180mg  and Prograf 1mg     Other medication(s) to be shipped: Atorvastastin 10mg   Amlodipine 5mg      Justin Mcknight, DOB: 1956-01-05  Phone: [phone] (home)       All above HIPAA information was verified with patient.     Completed refill call assessment today to schedule patient's medication shipment from the [facility] [phone]).       Specialty medication(s) and dose(s) confirmed: Regimen is correct and unchanged.   Changes to medications: Justin Mcknight reports no changes reported at this time.  Changes to insurance: No  Questions for the pharmacist: No    The patient will receive a drug information handout for each medication shipped and additional FDA Medication Guides as required.      DISEASE/MEDICATION-SPECIFIC INFORMATION        N/A    ADHERENCE     Medication Adherence    Patient reported X missed doses in the last month:  0          Support network for adherence:  family member                  MEDICARE PART B DOCUMENTATION     Myfortic 180mg : Patient has 8 days worth of tablets on hand.  Prograf 1mg : Patient has 8 days worth of capsules on hand.    SHIPPING     Shipping address confirmed in Epic.     Delivery Scheduled: Yes, Expected medication delivery date: 05/29/18 via UPS or courier.     Medication will be delivered via UPS to the home address in Epic [place].    Justin Mcknight   [address] [facility] Specialty Technician

## 2018-05-28 ENCOUNTER — Ambulatory Visit: Admit: 2018-05-28 | Discharge: 2018-05-28 | Payer: MEDICARE

## 2018-05-28 ENCOUNTER — Ambulatory Visit: Admit: 2018-05-28 | Discharge: 2018-05-28 | Payer: MEDICARE | Attending: Nephrology | Primary: Nephrology

## 2018-05-28 DIAGNOSIS — Z0000 Encounter for general adult medical examination without abnormal findings: Secondary | ICD-10-CM

## 2018-05-28 DIAGNOSIS — I10 Essential (primary) hypertension: Secondary | ICD-10-CM

## 2018-05-28 DIAGNOSIS — Z940 Kidney transplant status: Principal | ICD-10-CM

## 2018-05-28 DIAGNOSIS — D899 Disorder involving the immune mechanism, unspecified: Secondary | ICD-10-CM

## 2018-05-28 LAB — LIPID PANEL
CHOLESTEROL/HDL RATIO SCREEN: 2.4 (ref <5.0)
CHOLESTEROL: 144 mg/dL (ref 100–199)
LDL CHOLESTEROL CALCULATED: 69 mg/dL (ref 60–99)
TRIGLYCERIDES: 71 mg/dL (ref 1–149)
VLDL CHOLESTEROL CAL: 14.2 mg/dL (ref 12–42)

## 2018-05-28 LAB — URINALYSIS
BACTERIA: NONE SEEN /HPF
BILIRUBIN UA: NEGATIVE
GLUCOSE UA: NEGATIVE
KETONES UA: NEGATIVE
LEUKOCYTE ESTERASE UA: NEGATIVE
NITRITE UA: NEGATIVE
PH UA: 5.5 (ref 5.0–9.0)
PROTEIN UA: NEGATIVE
RBC UA: 2 /HPF (ref <=3)
SPECIFIC GRAVITY UA: 1.012 (ref 1.003–1.030)
UROBILINOGEN UA: 0.2
WBC UA: <1 /HPF (ref <=2)

## 2018-05-28 LAB — CMV COMMENT: Lab: 0

## 2018-05-28 LAB — MANUAL DIFFERENTIAL
BASOPHILS - ABS (DIFF): 0 10*9/L (ref 0.0–0.1)
BASOPHILS - REL (DIFF): 0 %
EOSINOPHILS - ABS (DIFF): 0.1 10*9/L (ref 0.0–0.4)
EOSINOPHILS - REL (DIFF): 1 %
LYMPHOCYTES - REL (DIFF): 10 %
MONOCYTES - REL (DIFF): 4 %
NEUTROPHILS - ABS (DIFF): 5.2 10*9/L (ref 2.0–7.5)
NEUTROPHILS - REL (DIFF): 85 %

## 2018-05-28 LAB — PARATHYROID HORMONE INTACT: Parathyrin.intact:MCnc:Pt:Ser/Plas:Qn:: 120.3 — ABNORMAL HIGH

## 2018-05-28 LAB — CMV DNA, QUANTITATIVE, PCR: CMV VIRAL LD: NOT DETECTED

## 2018-05-28 LAB — WBC ADJUSTED: Lab: 6.1

## 2018-05-28 LAB — CBC W/ AUTO DIFF
HEMATOCRIT: 51.6 % (ref 41.0–53.0)
HEMOGLOBIN: 15.6 g/dL (ref 13.5–17.5)
MEAN CORPUSCULAR HEMOGLOBIN CONC: 30.3 g/dL — ABNORMAL LOW (ref 31.0–37.0)
MEAN CORPUSCULAR HEMOGLOBIN: 28.7 pg (ref 26.0–34.0)
MEAN CORPUSCULAR VOLUME: 94.8 fL (ref 80.0–100.0)
RED BLOOD CELL COUNT: 5.44 10*12/L (ref 4.50–5.90)
RED CELL DISTRIBUTION WIDTH: 14.6 % (ref 12.0–15.0)
WBC ADJUSTED: 6.1 10*9/L (ref 4.5–11.0)

## 2018-05-28 LAB — BASIC METABOLIC PANEL
ANION GAP: 11 mmol/L (ref 7–15)
BLOOD UREA NITROGEN: 25 mg/dL — ABNORMAL HIGH (ref 7–21)
BUN / CREAT RATIO: 14
CALCIUM: 9.9 mg/dL (ref 8.5–10.2)
CHLORIDE: 98 mmol/L (ref 98–107)
CO2: 27 mmol/L (ref 22.0–30.0)
EGFR CKD-EPI AA MALE: 46 mL/min/{1.73_m2} — ABNORMAL LOW (ref >=60)
EGFR CKD-EPI NON-AA MALE: 39 mL/min/{1.73_m2} — ABNORMAL LOW (ref >=60)
GLUCOSE RANDOM: 92 mg/dL (ref 65–99)
POTASSIUM: 4.7 mmol/L (ref 3.5–5.0)

## 2018-05-28 LAB — PROTEIN / CREATININE RATIO, URINE
PROTEIN URINE: 12 mg/dL
PROTEIN/CREAT RATIO, URINE: 0.148

## 2018-05-28 LAB — EGFR CKD-EPI AA MALE: Lab: 46 — ABNORMAL LOW

## 2018-05-28 LAB — PROTEIN/CREAT RATIO, URINE: Protein/Creatinine:MRto:Pt:Urine:Qn:: 0.148

## 2018-05-28 LAB — TACROLIMUS, TROUGH: Lab: 11.4

## 2018-05-28 LAB — WBC UA: Lab: <1

## 2018-05-28 LAB — EOSINOPHILS - ABS (DIFF): Lab: 0.1

## 2018-05-28 LAB — CHOLESTEROL/HDL RATIO SCREEN: Lab: 2.4

## 2018-05-28 MED ORDER — LISINOPRIL 5 MG TABLET
ORAL_TABLET | Freq: Every evening | ORAL | 11 refills | 0.00000 days | Status: CP
Start: 2018-05-28 — End: 2018-09-02
  Filled 2018-05-29: qty 60, 30d supply, fill #0

## 2018-05-28 MED FILL — MYFORTIC 180 MG TABLET,DELAYED RELEASE: 30 days supply | Qty: 180 | Fill #3 | Status: AC

## 2018-05-28 MED FILL — MYFORTIC 180 MG TABLET,DELAYED RELEASE: ORAL | 30 days supply | Qty: 180 | Fill #3

## 2018-05-28 MED FILL — PROGRAF 1 MG CAPSULE: 30 days supply | Qty: 180 | Fill #3 | Status: AC

## 2018-05-28 NOTE — Unmapped (Signed)
Saw patient with spanish interpreter.  Patient reports he is doing well.  Had AVF ligation/aneurysm resection on 10/18 and has had some swelling to left wrist.  Took antibiotics and finished early November. Has follow up with Dr Norma Fredrickson on 12.5.    Denies any problems with medications    Denies HA, CP, SOB, dizziness, abdominal pain, n/v/d, fevers, tremors, or urinary problems.    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  8 hour shifts 40/48 hours a week    Had labs this morning, took prograf at 9pm

## 2018-05-28 NOTE — Unmapped (Signed)
Post op visit    Reason for visit:for vascular access follow up??s/p Left forearm AVF ligation and aneursym resection on 04/25/2018.  ??  Site looks unchanged from last visit. Only slightly compressable, no drainage. Denies fevers or chills. Photograph taken today for comparison loaded to Media tab in Epic. Pt has taken courses of antibiotics.   ??  Plan: patient to return in three weeks. Likely with PVL study if no change or expanding to r/o pseudoaneurysm or aneurysm.

## 2018-05-29 MED ORDER — PROGRAF 1 MG CAPSULE
ORAL_CAPSULE | 11 refills | 0 days | Status: CP
Start: 2018-05-29 — End: 2018-07-21
  Filled 2018-05-28: qty 180, 30d supply, fill #3
  Filled 2018-06-30: qty 240, 30d supply, fill #0

## 2018-05-29 MED FILL — LISINOPRIL 5 MG TABLET: 30 days supply | Qty: 60 | Fill #0 | Status: AC

## 2018-05-29 NOTE — Unmapped (Signed)
Per Dr. Toni Arthurs patient to decrease his tac dose  To 3mg  in the morning and 2mg  at night. Verify patient had flu shot. Labs monday      Attempted to call patient with use of pacific interpreters. Patient did not answer. Will call again later

## 2018-05-29 NOTE — Unmapped (Signed)
[facility] transplant nephrology clinic visit    assessment and plan  1. s/p kidney transplant 09/03/2017. baseline creatinine 1.6-2 mg/dl. no proteinuria. no donor specific hla ab detected. tacrolimus decr to 3mg /2mg  am/pm. tacrolimus 12hr lvl 6-8 ng/ml.   2. hypertension. lisinopril 10mg  q.pm prescribed in place of amlodipine. ace inh well tolerated in past; adverse effect profile incl hyperkalemia cough and angioedema reviewed. repeat labs in 1week. blood pressure goal < 130/80 mmhg.  3. hyperlipidemia. atorvastatin 10mg  daily.  4. preventive medicine. influenza '18; patient left clinic prior to influenza vaccine administration. ppsv23 pneumococcal '12. pcv13 pneumococcal and zoster vaccination recommended 1 year post transplant. abdomen ct '18. colonoscopy '14 with 5 year follow up recommended.    history of present illness    Justin Mcknight is a 62 year old gentleman seen in follow up s/p kidney transplant 09/03/2017. post transplant course notable for +mild focal thrombotic microangiopathy seen on zero hour kidney bx and on follow up x2wks post transplant which had resolved on follow up kidney bx 05/19.    [name] is seen with assistance of Hester Engineer, structural. he feels well. no fevers chills or sweats. no chest pain palpitations or shortness of breath. no lower ext edema. he underwent ligation with aneurysm resection left forearm av fistula 10/18. no arm pain/swelling. no headache. appetite nl. no abdominal pain. no n/v/d. no dysuria hematuria or difficulty voiding. minimal tremor. all other systems reviewed and negative x10 systems.    past medical hx:  1. 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 hypertension. alemtuzumab induction. baseline creatinine 1.6-2 mg/dl.  > kidney bx 03/19: no acute rejection. +mild focal thrombotic microangiopathy.  > kidney bx 05/19: no acute rejection. no thrombotic microangiopathy.  2. hypertension  3. hx renal cell carcinoma s/p right nephrectomy '12.    past surgical hx: left forearm av fistula '12; ligation '19. right nephrectomy '12. kidney transplant '19.    allergies: codeine    medications: tacrolimus 3mg  bid, mycophenolate sodium 540mg  bid, amlodipine 5mg  daily, atorvastatin 10mg  daily.    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    physical exam: t97.3 p90 bp114/92 wt84.4kg bmi 23.9. wd/wn gentleman nl/appropriate affect and mood. nl sclera anicteric. mmm no thrush. neck supple no palpable ln. heart rrr nl s1s2 no m/r/g. lungs clear bilateral. abd soft nt/nd. no lower ext edema. msk no synovitis/tophi. skin no rash. neuro alert oriented non focal exam.    labs 05/28/18: wbc6.1 hgb15.6 hct51.6 plts188. WU132 k4.7 cl98 bicarb27 bun25 cr1.8 glc92 ca9.9. total cholesterol 144 tg 71 hdl 61 ldl 69. cmv pcr not detected. tacrolimus 11hr lvl 11.4 ng/ml. urine protein/cr 0.148.

## 2018-05-30 LAB — BK BLOOD COMMENT: Lab: 0

## 2018-05-30 LAB — BK VIRUS QUANTITATIVE PCR, BLOOD

## 2018-05-30 NOTE — Unmapped (Signed)
[facility] Pharm[facility] Refill and Clinical Coordination Note  Medication(s): Prograf 1mg   Spoke to patient about his dose change on Prograf to his new dose in 3 capsules in the morning and 2 capsules in the evening.  Patient declined needing Prograf or Myfortic today because of the shipment he received 2 days ago.   I told him he should be receiving his lisinopril today and his atorvastain should be filled on 11/25 for delivery on 11/26.       [facility], [place]: 20-Nov-1955  Phone: [phone] (home) , Alternate phone contact: N/A  Shipping address: [address]  [place] [address]  Phone or address changes today?: No  All above HIPAA information verified.  Insurance changes? No    Completed refill and clinical call assessment today to schedule patient's medication shipment from the [facility] [phone]).      MEDICATION RECONCILIATION    Confirmed the medication and dosage are correct and have not changed: Yes, regimen is correct and unchanged.    Were there any changes to your medication(s) in the past month:  No, there are no changes reported at this time.    ADHERENCE    Is this medicine transplant or covered by Medicare Part B? Yes.    Prograf 1mg : Patient has more than 3 weeks worth of capsules on hand.    Did you miss any doses in the past 4 weeks? No missed doses reported.  Adherence counseling provided? Not needed     SIDE EFFECT MANAGEMENT    Are you tolerating your medication?:  Brydon reports tolerating the medication.  Side effect management discussed: None      Therapy is appropriate and should be continued.    Evidence of clinical benefit: See Epic note from 05/28/18      FINANCIAL/SHIPPING    Delivery Scheduled: Patient declined refill at this time due to having just receiving myfortic and prograf 2 days ago..     Medication will be delivered via UPS to the home address in [facility].    Additional medications refilled: No additional medications/refills needed at this time.    The patient will receive a drug information handout for each medication shipped and additional FDA Medication Guides as required.      Bernis did not have any additional questions at this time.    Delivery address confirmed in Epic.     We will follow up with patient monthly for standard refill processing and delivery.      Thank you,  Tera Helper   [facility] Specialty Pharmacist

## 2018-06-02 MED FILL — ATORVASTATIN 10 MG TABLET: ORAL | 30 days supply | Qty: 30 | Fill #3

## 2018-06-02 MED FILL — ATORVASTATIN 10 MG TABLET: 30 days supply | Qty: 30 | Fill #3 | Status: AC

## 2018-06-03 ENCOUNTER — Ambulatory Visit: Admit: 2018-06-03 | Discharge: 2018-06-04 | Payer: MEDICARE

## 2018-06-03 DIAGNOSIS — Z940 Kidney transplant status: Principal | ICD-10-CM

## 2018-06-03 LAB — RENAL FUNCTION PANEL
ALBUMIN: 4.5 g/dL (ref 3.5–5.0)
BLOOD UREA NITROGEN: 25 mg/dL — ABNORMAL HIGH (ref 7–21)
BUN / CREAT RATIO: 15
CHLORIDE: 106 mmol/L (ref 98–107)
CO2: 28 mmol/L (ref 22.0–30.0)
CREATININE: 1.67 mg/dL — ABNORMAL HIGH (ref 0.70–1.30)
EGFR CKD-EPI AA MALE: 50 mL/min/{1.73_m2} — ABNORMAL LOW (ref >=60)
EGFR CKD-EPI NON-AA MALE: 44 mL/min/{1.73_m2} — ABNORMAL LOW (ref >=60)
GLUCOSE RANDOM: 101 mg/dL (ref 65–179)
PHOSPHORUS: 3.3 mg/dL (ref 2.9–4.7)
POTASSIUM: 4.6 mmol/L (ref 3.5–5.0)
SODIUM: 137 mmol/L (ref 135–145)

## 2018-06-03 LAB — CBC W/ AUTO DIFF
BASOPHILS ABSOLUTE COUNT: 0.1 10*9/L (ref 0.0–0.1)
BASOPHILS RELATIVE PERCENT: 1.1 %
EOSINOPHILS ABSOLUTE COUNT: 0.1 10*9/L (ref 0.0–0.4)
EOSINOPHILS RELATIVE PERCENT: 1.3 %
HEMATOCRIT: 49 % (ref 41.0–53.0)
HEMOGLOBIN: 15.9 g/dL (ref 13.5–17.5)
LARGE UNSTAINED CELLS: 2 % (ref 0–4)
LYMPHOCYTES RELATIVE PERCENT: 17.6 %
MEAN CORPUSCULAR HEMOGLOBIN CONC: 32.4 g/dL (ref 31.0–37.0)
MEAN CORPUSCULAR HEMOGLOBIN: 30.5 pg (ref 26.0–34.0)
MEAN CORPUSCULAR VOLUME: 94 fL (ref 80.0–100.0)
MEAN PLATELET VOLUME: 7.9 fL (ref 7.0–10.0)
MONOCYTES RELATIVE PERCENT: 7.1 %
NEUTROPHILS ABSOLUTE COUNT: 4.2 10*9/L (ref 2.0–7.5)
NEUTROPHILS RELATIVE PERCENT: 70.9 %
PLATELET COUNT: 173 10*9/L (ref 150–440)
RED BLOOD CELL COUNT: 5.21 10*12/L (ref 4.50–5.90)
RED CELL DISTRIBUTION WIDTH: 15.1 % — ABNORMAL HIGH (ref 12.0–15.0)
WBC ADJUSTED: 5.9 10*9/L (ref 4.5–11.0)

## 2018-06-03 LAB — LYMPHOCYTES RELATIVE PERCENT: Lab: 17.6

## 2018-06-03 LAB — POTASSIUM: Potassium:SCnc:Pt:Ser/Plas:Qn:: 4.6

## 2018-06-03 LAB — PHOSPHORUS: Phosphate:MCnc:Pt:Ser/Plas:Qn:: 3.3

## 2018-06-03 LAB — TACROLIMUS, TROUGH: Lab: 8

## 2018-06-03 LAB — MAGNESIUM: Magnesium:MCnc:Pt:Ser/Plas:Qn:: 1.5 — ABNORMAL LOW

## 2018-06-10 ENCOUNTER — Ambulatory Visit: Admit: 2018-06-10 | Discharge: 2018-06-11 | Payer: MEDICARE

## 2018-06-10 DIAGNOSIS — Z940 Kidney transplant status: Principal | ICD-10-CM

## 2018-06-10 LAB — CBC W/ AUTO DIFF
BASOPHILS ABSOLUTE COUNT: 0.1 10*9/L (ref 0.0–0.1)
BASOPHILS RELATIVE PERCENT: 1.6 %
EOSINOPHILS ABSOLUTE COUNT: 0.1 10*9/L (ref 0.0–0.4)
EOSINOPHILS RELATIVE PERCENT: 1.6 %
HEMATOCRIT: 49.9 % (ref 41.0–53.0)
HEMOGLOBIN: 15.7 g/dL (ref 13.5–17.5)
LARGE UNSTAINED CELLS: 2 % (ref 0–4)
LYMPHOCYTES ABSOLUTE COUNT: 1 10*9/L — ABNORMAL LOW (ref 1.5–5.0)
LYMPHOCYTES RELATIVE PERCENT: 16.1 %
MEAN CORPUSCULAR HEMOGLOBIN CONC: 31.5 g/dL (ref 31.0–37.0)
MEAN CORPUSCULAR HEMOGLOBIN: 29.7 pg (ref 26.0–34.0)
MEAN CORPUSCULAR VOLUME: 94.1 fL (ref 80.0–100.0)
MEAN PLATELET VOLUME: 7.8 fL (ref 7.0–10.0)
MONOCYTES ABSOLUTE COUNT: 0.4 10*9/L (ref 0.2–0.8)
MONOCYTES RELATIVE PERCENT: 5.8 %
PLATELET COUNT: 189 10*9/L (ref 150–440)
RED BLOOD CELL COUNT: 5.3 10*12/L (ref 4.50–5.90)
RED CELL DISTRIBUTION WIDTH: 15.1 % — ABNORMAL HIGH (ref 12.0–15.0)
WBC ADJUSTED: 6.4 10*9/L (ref 4.5–11.0)

## 2018-06-10 LAB — RENAL FUNCTION PANEL
ALBUMIN: 4.5 g/dL (ref 3.5–5.0)
ANION GAP: 4 mmol/L — ABNORMAL LOW (ref 7–15)
BLOOD UREA NITROGEN: 26 mg/dL — ABNORMAL HIGH (ref 7–21)
BUN / CREAT RATIO: 16
CALCIUM: 10.1 mg/dL (ref 8.5–10.2)
CHLORIDE: 107 mmol/L (ref 98–107)
CO2: 26 mmol/L (ref 22.0–30.0)
CREATININE: 1.59 mg/dL — ABNORMAL HIGH (ref 0.70–1.30)
EGFR CKD-EPI AA MALE: 53 mL/min/{1.73_m2} — ABNORMAL LOW (ref >=60)
GLUCOSE RANDOM: 101 mg/dL (ref 65–179)
PHOSPHORUS: 3.5 mg/dL (ref 2.9–4.7)
POTASSIUM: 5 mmol/L (ref 3.5–5.0)
SODIUM: 137 mmol/L (ref 135–145)

## 2018-06-10 LAB — MAGNESIUM: Magnesium:MCnc:Pt:Ser/Plas:Qn:: 1.5 — ABNORMAL LOW

## 2018-06-10 LAB — PHOSPHORUS: Phosphate:MCnc:Pt:Ser/Plas:Qn:: 3.5

## 2018-06-10 LAB — NEUTROPHILS RELATIVE PERCENT: Lab: 73.2

## 2018-06-10 LAB — TACROLIMUS, TROUGH: Lab: 8.4

## 2018-06-12 ENCOUNTER — Ambulatory Visit: Admit: 2018-06-12 | Discharge: 2018-06-13 | Payer: MEDICARE | Attending: Surgery | Primary: Surgery

## 2018-06-12 DIAGNOSIS — T82898D Other specified complication of vascular prosthetic devices, implants and grafts, subsequent encounter: Principal | ICD-10-CM

## 2018-06-12 NOTE — Unmapped (Signed)
Post op visit    Reason for visit:for vascular access follow up??s/p Left forearm AVF ligation and aneursym resection on 04/25/2018.  ??  Site looks improved from last visit. Small firm likely clotted hematoma vs aneurysm at wrist, no drainage. Denies fevers or chills.  Pt has taken courses of antibiotics.   ??  Plan: He is back to work and without complaints.  No further f/u at this time, continue f/u with transplant nephrology.

## 2018-06-16 ENCOUNTER — Ambulatory Visit: Admit: 2018-06-16 | Discharge: 2018-06-17 | Payer: MEDICARE

## 2018-06-16 DIAGNOSIS — Z940 Kidney transplant status: Principal | ICD-10-CM

## 2018-06-16 LAB — CBC W/ AUTO DIFF
BASOPHILS ABSOLUTE COUNT: 0 10*9/L (ref 0.0–0.1)
BASOPHILS RELATIVE PERCENT: 0.6 %
EOSINOPHILS ABSOLUTE COUNT: 0.1 10*9/L (ref 0.0–0.4)
EOSINOPHILS RELATIVE PERCENT: 1 %
HEMATOCRIT: 49.1 % (ref 41.0–53.0)
HEMOGLOBIN: 15.9 g/dL (ref 13.5–17.5)
LARGE UNSTAINED CELLS: 2 % (ref 0–4)
LYMPHOCYTES ABSOLUTE COUNT: 1.1 10*9/L — ABNORMAL LOW (ref 1.5–5.0)
LYMPHOCYTES RELATIVE PERCENT: 15.7 %
MEAN CORPUSCULAR HEMOGLOBIN CONC: 32.4 g/dL (ref 31.0–37.0)
MEAN CORPUSCULAR HEMOGLOBIN: 30.5 pg (ref 26.0–34.0)
MEAN CORPUSCULAR VOLUME: 94.1 fL (ref 80.0–100.0)
MEAN PLATELET VOLUME: 8 fL (ref 7.0–10.0)
MONOCYTES ABSOLUTE COUNT: 0.6 10*9/L (ref 0.2–0.8)
MONOCYTES RELATIVE PERCENT: 8.7 %
NEUTROPHILS ABSOLUTE COUNT: 4.9 10*9/L (ref 2.0–7.5)
NEUTROPHILS RELATIVE PERCENT: 72.4 %
PLATELET COUNT: 184 10*9/L (ref 150–440)
WBC ADJUSTED: 6.8 10*9/L (ref 4.5–11.0)

## 2018-06-16 LAB — RENAL FUNCTION PANEL
ANION GAP: 9 mmol/L (ref 7–15)
BLOOD UREA NITROGEN: 34 mg/dL — ABNORMAL HIGH (ref 7–21)
BUN / CREAT RATIO: 19
CALCIUM: 10 mg/dL (ref 8.5–10.2)
CHLORIDE: 103 mmol/L (ref 98–107)
CO2: 24 mmol/L (ref 22.0–30.0)
CREATININE: 1.81 mg/dL — ABNORMAL HIGH (ref 0.70–1.30)
EGFR CKD-EPI AA MALE: 46 mL/min/{1.73_m2} — ABNORMAL LOW (ref >=60)
EGFR CKD-EPI NON-AA MALE: 39 mL/min/{1.73_m2} — ABNORMAL LOW (ref >=60)
GLUCOSE RANDOM: 108 mg/dL (ref 65–179)
PHOSPHORUS: 3.3 mg/dL (ref 2.9–4.7)
POTASSIUM: 5.3 mmol/L — ABNORMAL HIGH (ref 3.5–5.0)

## 2018-06-16 LAB — MAGNESIUM: Magnesium:MCnc:Pt:Ser/Plas:Qn:: 1.7

## 2018-06-16 LAB — HEMOGLOBIN: Lab: 15.9

## 2018-06-16 LAB — BLOOD UREA NITROGEN: Urea nitrogen:MCnc:Pt:Ser/Plas:Qn:: 34 — ABNORMAL HIGH

## 2018-06-16 LAB — SMEAR REVIEW

## 2018-06-16 LAB — TACROLIMUS, TROUGH: Lab: 5.9

## 2018-06-20 NOTE — Unmapped (Signed)
PT denied specialty meds. States that he has over 3 weeks worth of medication on hand. Rescheduling call for 07/11/18.

## 2018-06-23 ENCOUNTER — Ambulatory Visit: Admit: 2018-06-23 | Discharge: 2018-06-24 | Payer: MEDICARE

## 2018-06-23 DIAGNOSIS — Z940 Kidney transplant status: Principal | ICD-10-CM

## 2018-06-23 LAB — CBC W/ AUTO DIFF
BASOPHILS ABSOLUTE COUNT: 0 10*9/L (ref 0.0–0.1)
BASOPHILS RELATIVE PERCENT: 0.7 %
EOSINOPHILS ABSOLUTE COUNT: 0.1 10*9/L (ref 0.0–0.4)
EOSINOPHILS RELATIVE PERCENT: 1.1 %
HEMATOCRIT: 48.7 % (ref 41.0–53.0)
HEMOGLOBIN: 15.5 g/dL (ref 13.5–17.5)
LARGE UNSTAINED CELLS: 2 % (ref 0–4)
LYMPHOCYTES ABSOLUTE COUNT: 1.2 10*9/L — ABNORMAL LOW (ref 1.5–5.0)
LYMPHOCYTES RELATIVE PERCENT: 19.3 %
MEAN CORPUSCULAR HEMOGLOBIN CONC: 31.9 g/dL (ref 31.0–37.0)
MEAN CORPUSCULAR HEMOGLOBIN: 29.9 pg (ref 26.0–34.0)
MEAN CORPUSCULAR VOLUME: 94 fL (ref 80.0–100.0)
MEAN PLATELET VOLUME: 8.1 fL (ref 7.0–10.0)
MONOCYTES ABSOLUTE COUNT: 0.5 10*9/L (ref 0.2–0.8)
MONOCYTES RELATIVE PERCENT: 8.2 %
NEUTROPHILS ABSOLUTE COUNT: 4.1 10*9/L (ref 2.0–7.5)
NEUTROPHILS RELATIVE PERCENT: 69.1 %
PLATELET COUNT: 170 10*9/L (ref 150–440)
RED BLOOD CELL COUNT: 5.18 10*12/L (ref 4.50–5.90)
WBC ADJUSTED: 6 10*9/L (ref 4.5–11.0)

## 2018-06-23 LAB — RENAL FUNCTION PANEL
ALBUMIN: 4.4 g/dL (ref 3.5–5.0)
ANION GAP: 9 mmol/L (ref 7–15)
BUN / CREAT RATIO: 15
CALCIUM: 10.1 mg/dL (ref 8.5–10.2)
CHLORIDE: 103 mmol/L (ref 98–107)
CO2: 25 mmol/L (ref 22.0–30.0)
CREATININE: 1.68 mg/dL — ABNORMAL HIGH (ref 0.70–1.30)
EGFR CKD-EPI AA MALE: 50 mL/min/{1.73_m2} — ABNORMAL LOW (ref >=60)
EGFR CKD-EPI NON-AA MALE: 43 mL/min/{1.73_m2} — ABNORMAL LOW (ref >=60)
GLUCOSE RANDOM: 111 mg/dL (ref 65–179)
PHOSPHORUS: 2.9 mg/dL (ref 2.9–4.7)
POTASSIUM: 4.9 mmol/L (ref 3.5–5.0)
SODIUM: 137 mmol/L (ref 135–145)

## 2018-06-23 LAB — CREATININE: Creatinine:MCnc:Pt:Ser/Plas:Qn:: 1.68 — ABNORMAL HIGH

## 2018-06-23 LAB — TACROLIMUS, TROUGH: Lab: 6

## 2018-06-23 LAB — MAGNESIUM: Magnesium:MCnc:Pt:Ser/Plas:Qn:: 1.6

## 2018-06-23 LAB — HEMATOCRIT: Lab: 48.7

## 2018-06-28 NOTE — Unmapped (Signed)
Contacted by Dr. Edwin Cap at [facility] in regards to localized shingles observed by the patient that developed in the last 24-48 hours.  No eye/face involvement or signs of dissemination.  Recommended starting the patient on renally dosed Valacyclovir 1g BID for 10 days based on his most recent GFR of 43.  The patient will contact his coordinator in the next few days to update his clinical improvement and to determine if longer duration of therapy is warranted.      Jamelle Noy S. Bryson Corona, M.D.  Assistant Professor  [facility]  [id number]  [place], [place] [address]  Office: [phone]  Fax: [phone]  Pager: [phone]  [email or web address]  June 28, 2018 11:58 AM

## 2018-06-30 ENCOUNTER — Ambulatory Visit: Admit: 2018-06-30 | Discharge: 2018-07-01 | Payer: MEDICARE

## 2018-06-30 DIAGNOSIS — Z940 Kidney transplant status: Principal | ICD-10-CM

## 2018-06-30 LAB — RENAL FUNCTION PANEL
ALBUMIN: 4.6 g/dL (ref 3.5–5.0)
ANION GAP: 9 mmol/L (ref 7–15)
BLOOD UREA NITROGEN: 28 mg/dL — ABNORMAL HIGH (ref 7–21)
BUN / CREAT RATIO: 15
CALCIUM: 10.3 mg/dL — ABNORMAL HIGH (ref 8.5–10.2)
CHLORIDE: 103 mmol/L (ref 98–107)
EGFR CKD-EPI AA MALE: 44 mL/min/{1.73_m2} — ABNORMAL LOW (ref >=60)
EGFR CKD-EPI NON-AA MALE: 38 mL/min/{1.73_m2} — ABNORMAL LOW (ref >=60)
GLUCOSE RANDOM: 112 mg/dL (ref 65–179)
PHOSPHORUS: 3.3 mg/dL (ref 2.9–4.7)
POTASSIUM: 5.5 mmol/L — ABNORMAL HIGH (ref 3.5–5.0)
SODIUM: 136 mmol/L (ref 135–145)

## 2018-06-30 LAB — CBC W/ AUTO DIFF
BASOPHILS ABSOLUTE COUNT: 0.1 10*9/L (ref 0.0–0.1)
EOSINOPHILS ABSOLUTE COUNT: 0.1 10*9/L (ref 0.0–0.4)
EOSINOPHILS RELATIVE PERCENT: 0.9 %
HEMATOCRIT: 48.1 % (ref 41.0–53.0)
HEMOGLOBIN: 15.6 g/dL (ref 13.5–17.5)
LARGE UNSTAINED CELLS: 2 % (ref 0–4)
LYMPHOCYTES ABSOLUTE COUNT: 1.2 10*9/L — ABNORMAL LOW (ref 1.5–5.0)
LYMPHOCYTES RELATIVE PERCENT: 17.5 %
MEAN CORPUSCULAR HEMOGLOBIN CONC: 32.4 g/dL (ref 31.0–37.0)
MEAN CORPUSCULAR HEMOGLOBIN: 30.3 pg (ref 26.0–34.0)
MEAN CORPUSCULAR VOLUME: 93.4 fL (ref 80.0–100.0)
MEAN PLATELET VOLUME: 8.3 fL (ref 7.0–10.0)
MONOCYTES ABSOLUTE COUNT: 0.6 10*9/L (ref 0.2–0.8)
MONOCYTES RELATIVE PERCENT: 7.7 %
NEUTROPHILS ABSOLUTE COUNT: 5 10*9/L (ref 2.0–7.5)
NEUTROPHILS RELATIVE PERCENT: 71 %
PLATELET COUNT: 169 10*9/L (ref 150–440)
RED BLOOD CELL COUNT: 5.15 10*12/L (ref 4.50–5.90)
RED CELL DISTRIBUTION WIDTH: 14.6 % (ref 12.0–15.0)
WBC ADJUSTED: 7.1 10*9/L (ref 4.5–11.0)

## 2018-06-30 LAB — MAGNESIUM: Magnesium:MCnc:Pt:Ser/Plas:Qn:: 1.6

## 2018-06-30 LAB — GLUCOSE RANDOM: Glucose:MCnc:Pt:Ser/Plas:Qn:: 112

## 2018-06-30 LAB — TACROLIMUS, TROUGH: Lab: 5.7

## 2018-06-30 LAB — LYMPHOCYTES RELATIVE PERCENT: Lab: 17.5

## 2018-06-30 MED FILL — PROGRAF 1 MG CAPSULE: 30 days supply | Qty: 240 | Fill #0 | Status: AC

## 2018-06-30 MED FILL — MYFORTIC 180 MG TABLET,DELAYED RELEASE: 30 days supply | Qty: 180 | Fill #4 | Status: AC

## 2018-06-30 MED FILL — ATORVASTATIN 10 MG TABLET: 30 days supply | Qty: 30 | Fill #4 | Status: AC

## 2018-06-30 MED FILL — LISINOPRIL 5 MG TABLET: ORAL | 30 days supply | Qty: 60 | Fill #1

## 2018-06-30 MED FILL — LISINOPRIL 5 MG TABLET: 30 days supply | Qty: 60 | Fill #1 | Status: AC

## 2018-06-30 MED FILL — MYFORTIC 180 MG TABLET,DELAYED RELEASE: ORAL | 30 days supply | Qty: 180 | Fill #4

## 2018-06-30 MED FILL — ATORVASTATIN 10 MG TABLET: ORAL | 30 days supply | Qty: 30 | Fill #4

## 2018-06-30 NOTE — Unmapped (Signed)
[facility] Refill Coordination Note    Specialty Medication(s) to be Shipped:   Transplant: Myfortic 180mg  and Prograf 1mg     Other medication(s) to be shipped: lipitor 10 mg     [facility], [place]: 1955/12/14  Phone: [phone] (home)       All above HIPAA information was verified with patient.     Completed refill call assessment today to schedule patient's medication shipment from the [facility] [phone]).       Specialty medication(s) and dose(s) confirmed: Regimen is correct and unchanged.   Changes to medications: Justin Mcknight reports no changes reported at this time.  Changes to insurance: No  Questions for the pharmacist: No    The patient will receive a drug information handout for each medication shipped and additional FDA Medication Guides as required.      DISEASE/MEDICATION-SPECIFIC INFORMATION        N/A    ADHERENCE     Medication Adherence            Support network for adherence:  family member                  MEDICARE PART B DOCUMENTATION     Myfortic 180mg : Patient has 4 days tablets on hand.  Prograf 1mg : Patient has 4 days capsules on hand.    SHIPPING     Shipping address confirmed in Epic.     Delivery Scheduled: Yes, Expected medication delivery date: 07/03/18 via UPS or courier.     Medication will be delivered via Same Day Courier to the home address in Epic [place].    Justin Mcknight   [facility] Specialty Technician

## 2018-07-01 NOTE — Unmapped (Signed)
Vascular Access for Dialysis Note:  [facility] left vm on my work line asking for me to call him back. Returned call 07/01/18 1020 via [employer] [place] ID # [id number]. He stated he meant to call his kidney coordinator. I gave him her name and phone number. Asked if I could help and he let me know he was diagnosed with Shingles, placed on medication for 10 days and that his doctor had notified Dr Ellie Lunch, nephrologist and that he was supposed to notify his TNC. I let him know I would notify her as well in case he was unable to contact her. He had no further questions or concerns at this time.

## 2018-07-07 ENCOUNTER — Ambulatory Visit: Admit: 2018-07-07 | Discharge: 2018-07-08 | Payer: MEDICARE

## 2018-07-07 DIAGNOSIS — Z940 Kidney transplant status: Principal | ICD-10-CM

## 2018-07-07 LAB — BASOPHILS - ABS (DIFF): Lab: 0

## 2018-07-07 LAB — RENAL FUNCTION PANEL
ALBUMIN: 4.4 g/dL (ref 3.5–5.0)
ANION GAP: 10 mmol/L (ref 7–15)
BLOOD UREA NITROGEN: 25 mg/dL — ABNORMAL HIGH (ref 7–21)
CALCIUM: 10 mg/dL (ref 8.5–10.2)
CHLORIDE: 102 mmol/L (ref 98–107)
CO2: 24 mmol/L (ref 22.0–30.0)
CREATININE: 1.68 mg/dL — ABNORMAL HIGH (ref 0.70–1.30)
EGFR CKD-EPI AA MALE: 50 mL/min/{1.73_m2} — ABNORMAL LOW (ref >=60)
EGFR CKD-EPI NON-AA MALE: 43 mL/min/{1.73_m2} — ABNORMAL LOW (ref >=60)
GLUCOSE RANDOM: 107 mg/dL (ref 65–179)
PHOSPHORUS: 2.9 mg/dL (ref 2.9–4.7)
POTASSIUM: 5.4 mmol/L — ABNORMAL HIGH (ref 3.5–5.0)
SODIUM: 136 mmol/L (ref 135–145)

## 2018-07-07 LAB — TACROLIMUS, TROUGH: Lab: 7.7

## 2018-07-07 LAB — CBC W/ AUTO DIFF
HEMATOCRIT: 46.4 % (ref 41.0–53.0)
HEMOGLOBIN: 14.8 g/dL (ref 13.5–17.5)
MEAN CORPUSCULAR HEMOGLOBIN CONC: 31.8 g/dL (ref 31.0–37.0)
MEAN CORPUSCULAR VOLUME: 92.7 fL (ref 80.0–100.0)
MEAN PLATELET VOLUME: 8.2 fL (ref 7.0–10.0)
PLATELET COUNT: 170 10*9/L (ref 150–440)
RED BLOOD CELL COUNT: 5.01 10*12/L (ref 4.50–5.90)
RED CELL DISTRIBUTION WIDTH: 15 % (ref 12.0–15.0)

## 2018-07-07 LAB — HEMATOCRIT: Lab: 46.4

## 2018-07-07 LAB — MANUAL DIFFERENTIAL
BASOPHILS - ABS (DIFF): 0 10*9/L (ref 0.0–0.1)
BASOPHILS - REL (DIFF): 0 %
EOSINOPHILS - ABS (DIFF): 0 10*9/L (ref 0.0–0.4)
EOSINOPHILS - REL (DIFF): 1 %
LYMPHOCYTES - ABS (DIFF): 1 10*9/L — ABNORMAL LOW (ref 1.5–5.0)
MONOCYTES - ABS (DIFF): 0.4 10*9/L (ref 0.2–0.8)
MONOCYTES - REL (DIFF): 8 %
NEUTROPHILS - REL (DIFF): 69 %

## 2018-07-07 LAB — MAGNESIUM: Magnesium:MCnc:Pt:Ser/Plas:Qn:: 1.6

## 2018-07-07 LAB — CREATININE: Creatinine:MCnc:Pt:Ser/Plas:Qn:: 1.68 — ABNORMAL HIGH

## 2018-07-09 DIAGNOSIS — Z8619 Personal history of other infectious and parasitic diseases: Secondary | ICD-10-CM

## 2018-07-09 HISTORY — DX: Personal history of other infectious and parasitic diseases: Z86.19

## 2018-07-14 ENCOUNTER — Ambulatory Visit: Admit: 2018-07-14 | Discharge: 2018-07-15 | Payer: MEDICARE

## 2018-07-14 DIAGNOSIS — Z940 Kidney transplant status: Principal | ICD-10-CM

## 2018-07-14 LAB — RED BLOOD CELL COUNT: Lab: 4.71

## 2018-07-14 LAB — RENAL FUNCTION PANEL
ALBUMIN: 4.3 g/dL (ref 3.5–5.0)
ANION GAP: 8 mmol/L (ref 7–15)
BLOOD UREA NITROGEN: 32 mg/dL — ABNORMAL HIGH (ref 7–21)
BUN / CREAT RATIO: 18
CALCIUM: 9.9 mg/dL (ref 8.5–10.2)
CHLORIDE: 102 mmol/L (ref 98–107)
CO2: 26 mmol/L (ref 22.0–30.0)
CREATININE: 1.82 mg/dL — ABNORMAL HIGH (ref 0.70–1.30)
EGFR CKD-EPI AA MALE: 45 mL/min/{1.73_m2} — ABNORMAL LOW (ref >=60)
EGFR CKD-EPI NON-AA MALE: 39 mL/min/{1.73_m2} — ABNORMAL LOW (ref >=60)
GLUCOSE RANDOM: 110 mg/dL (ref 65–179)
POTASSIUM: 5.4 mmol/L — ABNORMAL HIGH (ref 3.5–5.0)
SODIUM: 136 mmol/L (ref 135–145)

## 2018-07-14 LAB — MAGNESIUM: Magnesium:MCnc:Pt:Ser/Plas:Qn:: 1.8

## 2018-07-14 LAB — CBC W/ AUTO DIFF
BASOPHILS ABSOLUTE COUNT: 0 10*9/L (ref 0.0–0.1)
BASOPHILS RELATIVE PERCENT: 0.3 %
EOSINOPHILS ABSOLUTE COUNT: 0.1 10*9/L (ref 0.0–0.4)
EOSINOPHILS RELATIVE PERCENT: 1.2 %
HEMATOCRIT: 44.3 % (ref 41.0–53.0)
HEMOGLOBIN: 14.3 g/dL (ref 13.5–17.5)
LARGE UNSTAINED CELLS: 2 % (ref 0–4)
LYMPHOCYTES ABSOLUTE COUNT: 1 10*9/L — ABNORMAL LOW (ref 1.5–5.0)
LYMPHOCYTES RELATIVE PERCENT: 17.1 %
MEAN CORPUSCULAR HEMOGLOBIN: 30.4 pg (ref 26.0–34.0)
MEAN CORPUSCULAR VOLUME: 94 fL (ref 80.0–100.0)
MEAN PLATELET VOLUME: 8.5 fL (ref 7.0–10.0)
MONOCYTES ABSOLUTE COUNT: 0.6 10*9/L (ref 0.2–0.8)
MONOCYTES RELATIVE PERCENT: 10.1 %
NEUTROPHILS ABSOLUTE COUNT: 4 10*9/L (ref 2.0–7.5)
NEUTROPHILS RELATIVE PERCENT: 69.2 %
PLATELET COUNT: 178 10*9/L (ref 150–440)
RED CELL DISTRIBUTION WIDTH: 15.7 % — ABNORMAL HIGH (ref 12.0–15.0)
WBC ADJUSTED: 5.8 10*9/L (ref 4.5–11.0)

## 2018-07-14 LAB — TACROLIMUS, TROUGH: Lab: 5.6

## 2018-07-14 LAB — TACROLIMUS LEVEL, TROUGH: TACROLIMUS, TROUGH: 5.6 ng/mL (ref 5.0–15.0)

## 2018-07-14 LAB — SODIUM: Sodium:SCnc:Pt:Ser/Plas:Qn:: 136

## 2018-07-19 ENCOUNTER — Encounter: Payer: Self-pay | Admitting: Emergency Medicine

## 2018-07-19 ENCOUNTER — Emergency Department
Admission: EM | Admit: 2018-07-19 | Discharge: 2018-07-19 | Disposition: A | Discharge status: Home / Self Care | Payer: Medicare Other | Attending: Emergency Medicine | Admitting: Emergency Medicine

## 2018-07-19 ENCOUNTER — Other Ambulatory Visit: Payer: Self-pay

## 2018-07-19 DIAGNOSIS — I120 Hypertensive chronic kidney disease with stage 5 chronic kidney disease or end stage renal disease: Secondary | ICD-10-CM | POA: Insufficient documentation

## 2018-07-19 DIAGNOSIS — R21 Rash and other nonspecific skin eruption: Secondary | ICD-10-CM | POA: Diagnosis present

## 2018-07-19 DIAGNOSIS — Z79899 Other long term (current) drug therapy: Secondary | ICD-10-CM | POA: Insufficient documentation

## 2018-07-19 DIAGNOSIS — F17200 Nicotine dependence, unspecified, uncomplicated: Secondary | ICD-10-CM | POA: Insufficient documentation

## 2018-07-19 DIAGNOSIS — B029 Zoster without complications: Secondary | ICD-10-CM | POA: Insufficient documentation

## 2018-07-19 DIAGNOSIS — N186 End stage renal disease: Secondary | ICD-10-CM | POA: Insufficient documentation

## 2018-07-19 MED ORDER — GABAPENTIN 100 MG PO CAPS: 100.0000 mg | ORAL_CAPSULE | Freq: Two times a day (BID) | ORAL | 0 refills | Status: DC

## 2018-07-19 MED ORDER — VALACYCLOVIR HCL 1 G PO TABS: 1000.0000 mg | ORAL_TABLET | Freq: Two times a day (BID) | ORAL | 0 refills | Status: DC

## 2018-07-19 NOTE — Discharge Instructions (Signed)
Your medicine was sent to [facility] in [place].  See your doctor as needed

## 2018-07-19 NOTE — ED Triage Notes (Signed)
States has shingles 12/22. Finished medications. New blisters started this week R chest wall.

## 2018-07-19 NOTE — ED Provider Notes (Signed)
[facility] Emergency Department Provider Note   ____________________________________________   First MD Initiated Contact with Patient 07/19/18 1228     (approximate)  I have reviewed the triage vital signs and the nursing notes.   HISTORY  Chief Complaint Herpes Zoster Spanish interpreter.  HPI Christopher Hickman is a 63 y.o. male presents to the ED with complaint of rash.  Patient reports that he had shingles diagnosed on 12/22 and took all of his medication.  He states that this week he began having new blisters in the very same area that had starting to heal.  He tells the interpreter that he was seen at [facility] this morning and that the provider did not look at his rash but prescribed medication.  He is very unsatisfied and wants someone to look at it.  He states that it is painful but that he is willing to put up with it.  He occasionally takes Tylenol and has continued to work at his job.  He rates his pain today as an 8 out of 10.   Past Medical History:  Diagnosis Date  . Anemia of chronic kidney failure   . End stage renal disease ([place])   . Glaucoma   . Gout   . Hypertension   . Right renal mass     Patient Active Problem List   Diagnosis Date Noted  . GIB (gastrointestinal bleeding) 07/23/2017  . Chest pain 08/28/2016  . Hypertensive urgency 08/28/2016  . Dietary indiscretion 08/28/2016  . ESRD (end stage renal disease) on dialysis ([place]) 08/28/2016  . NICM (nonischemic cardiomyopathy) ([place]) 08/28/2016    Past Surgical History:  Procedure Laterality Date  . A/V FISTULAGRAM Left 07/26/2017   Procedure: A/V FISTULAGRAM;  Surgeon: Katha Cabal, MD;  Location: [facility] [place] [facility];  Service: Cardiovascular;  Laterality: Left;  . CARDIAC CATHETERIZATION  07/2004   [facility]; ef60%  . left arm av graft    . NEPHRECTOMY      Prior to Admission medications   Medication Sig Start Date End Date Taking? Authorizing Provider    amLODipine (NORVASC) 10 MG tablet Take 10 mg by mouth daily.     [provider]  b complex-C-folic acid 1 MG capsule Take 1 capsule by mouth daily.    [provider]  calcium acetate (PHOSLO) 667 MG capsule Take 2,001 mg by mouth 3 (three) times daily with meals. 05/24/16   [provider]  carvedilol (COREG) 6.25 MG tablet Take 6.25 mg by mouth daily.     [provider]  cinacalcet (SENSIPAR) 30 MG tablet Take 30 mg by mouth daily.     [provider]  gabapentin (NEURONTIN) 100 MG capsule Take 1 capsule (100 mg total) by mouth 2 (two) times daily. 07/19/18   Christopher Hai, PA-C  valACYclovir (VALTREX) 1000 MG tablet Take 1 tablet (1,000 mg total) by mouth 2 (two) times daily. 07/19/18   Christopher Hai, PA-C  valsartan (DIOVAN) 160 MG tablet Take 160 mg by mouth at bedtime.     [provider]    Allergies Codeine  Family History  Problem Relation Age of Onset  . Hypertension Mother     Social History Social History   Tobacco Use  . Smoking status: Current Some Day Smoker  . Smokeless tobacco: Never Used  . Tobacco comment: occasional  Substance Use Topics  . Alcohol use: No  . Drug use: No    Review of Systems  Constitutional: No fever/chills Eyes: No visual changes. ENT: No sore throat. Cardiovascular: Denies chest pain. Respiratory: Denies shortness of breath. Gastrointestinal: No abdominal pain.  No nausea, no vomiting. Genitourinary: Negative for dysuria. Musculoskeletal: Negative for back pain. Skin: Positive for rash. Neurological: Negative for headaches, focal weakness or numbness. Allergic/Immunilogical: Patient is a kidney transplant patient.  ____________________________________________   PHYSICAL EXAM:  VITAL SIGNS: ED Triage Vitals  Enc Vitals Group     BP 07/19/18 1224 133/90     Pulse Rate 07/19/18 1224 93     Resp 07/19/18 1224 20     Temp 07/19/18 1224 98.2 F (36.8 C)     Temp  Source 07/19/18 1224 Oral     SpO2 07/19/18 1224 99 %     Weight 07/19/18 1225 185 lb (83.9 kg)     Height 07/19/18 1225 6\' 1"  (1.854 m)     Head Circumference --      Peak Flow --      Pain Score 07/19/18 1225 8     Pain Loc --      Pain Edu? --      Excl. in [place]? --    Constitutional: Alert and oriented. Well appearing and in no acute distress. Eyes: Conjunctivae are normal.  Head: Atraumatic. Neck: No stridor.   Cardiovascular: Normal rate, regular rhythm. Grossly normal heart sounds.  Good peripheral circulation. Respiratory: Normal respiratory effort.  No retractions. Lungs CTAB. Musculoskeletal: Upper and lower extremities without any difficulty.  Normal gait was noted. Neurologic:  Normal speech and language. No gross focal neurologic deficits are appreciated. No gait instability. Skin:  Skin is warm, dry.  There is a vesicular rash noted starting at the back and around to the front upper abdomen.  This follows the recent healing zoster area.  The new areas are on erythematous bases and blisters are intact at this time. Psychiatric: Mood and affect are normal. Speech and behavior are normal.  ____________________________________________   LABS (all labs ordered are listed, but only abnormal results are displayed)  Labs Reviewed - No data to display    PROCEDURES  Procedure(s) performed: None  Procedures  Critical Care performed: No  ____________________________________________   INITIAL IMPRESSION / ASSESSMENT AND PLAN / ED COURSE  As part of my medical decision making, I reviewed the following data within the electronic MEDICAL RECORD NUMBER Notes from prior ED visits and [place] Controlled Substance Database  Patient presents to the ED with rash that has broken out at the same place that he had herpes zoster in December.  He was seen by his provider this morning at [facility] but was not satisfied with the answer since they did not look at his rash.  On his  discharge papers it shows that he was prescribed Valtrex and gabapentin.  He states that he wants these medications sent to [facility] in [place] explained to him what each medications were for.  He is instructed to follow-up with his PCP if any continued problems.  ____________________________________________   FINAL CLINICAL IMPRESSION(S) / ED DIAGNOSES  Final diagnoses:  Herpes zoster without complication     ED Discharge Orders         Ordered    valACYclovir (VALTREX) 1000 MG tablet  2 times daily     07/19/18 1309    gabapentin (NEURONTIN) 100 MG capsule  2 times daily     07/19/18 1309           Note:  This  document was prepared using Systems analyst and may include unintentional dictation errors.    Christopher Hai, PA-C 07/19/18 1356    Schuyler Amor, MD 07/20/18 1331

## 2018-07-19 NOTE — ED Notes (Signed)
Interpreter requested 

## 2018-07-21 ENCOUNTER — Ambulatory Visit: Admit: 2018-07-21 | Discharge: 2018-07-22 | Payer: MEDICARE

## 2018-07-21 DIAGNOSIS — Z940 Kidney transplant status: Principal | ICD-10-CM

## 2018-07-21 LAB — CBC W/ AUTO DIFF
BASOPHILS RELATIVE PERCENT: 1.7 %
EOSINOPHILS ABSOLUTE COUNT: 0.1 10*9/L (ref 0.0–0.4)
EOSINOPHILS RELATIVE PERCENT: 1 %
HEMATOCRIT: 44.4 % (ref 41.0–53.0)
HEMOGLOBIN: 13.8 g/dL (ref 13.5–17.5)
LARGE UNSTAINED CELLS: 2 % (ref 0–4)
LYMPHOCYTES ABSOLUTE COUNT: 0.8 10*9/L — ABNORMAL LOW (ref 1.5–5.0)
LYMPHOCYTES RELATIVE PERCENT: 14.7 %
MEAN CORPUSCULAR HEMOGLOBIN CONC: 31.2 g/dL (ref 31.0–37.0)
MEAN CORPUSCULAR HEMOGLOBIN: 29.3 pg (ref 26.0–34.0)
MEAN CORPUSCULAR VOLUME: 93.9 fL (ref 80.0–100.0)
MEAN PLATELET VOLUME: 8.2 fL (ref 7.0–10.0)
MONOCYTES ABSOLUTE COUNT: 0.7 10*9/L (ref 0.2–0.8)
MONOCYTES RELATIVE PERCENT: 11.8 %
NEUTROPHILS ABSOLUTE COUNT: 3.8 10*9/L (ref 2.0–7.5)
NEUTROPHILS RELATIVE PERCENT: 68.8 %
PLATELET COUNT: 202 10*9/L (ref 150–440)
RED BLOOD CELL COUNT: 4.73 10*12/L (ref 4.50–5.90)
WBC ADJUSTED: 5.5 10*9/L (ref 4.5–11.0)

## 2018-07-21 LAB — RENAL FUNCTION PANEL
ALBUMIN: 4.5 g/dL (ref 3.5–5.0)
BLOOD UREA NITROGEN: 29 mg/dL — ABNORMAL HIGH (ref 7–21)
BUN / CREAT RATIO: 16
CALCIUM: 10.1 mg/dL (ref 8.5–10.2)
CHLORIDE: 103 mmol/L (ref 98–107)
CO2: 24 mmol/L (ref 22.0–30.0)
EGFR CKD-EPI AA MALE: 44 mL/min/{1.73_m2} — ABNORMAL LOW (ref >=60)
EGFR CKD-EPI NON-AA MALE: 38 mL/min/{1.73_m2} — ABNORMAL LOW (ref >=60)
GLUCOSE RANDOM: 111 mg/dL (ref 65–179)
PHOSPHORUS: 3.4 mg/dL (ref 2.9–4.7)
POTASSIUM: 4.9 mmol/L (ref 3.5–5.0)
SODIUM: 136 mmol/L (ref 135–145)

## 2018-07-21 LAB — MAGNESIUM: Magnesium:MCnc:Pt:Ser/Plas:Qn:: 1.8

## 2018-07-21 LAB — EGFR CKD-EPI NON-AA MALE: Lab: 38 — ABNORMAL LOW

## 2018-07-21 LAB — MONOCYTES ABSOLUTE COUNT: Lab: 0.7

## 2018-07-21 MED ORDER — PROGRAF 1 MG CAPSULE
ORAL_CAPSULE | Freq: Two times a day (BID) | ORAL | 11 refills | 0.00000 days | Status: CP
Start: 2018-07-21 — End: 2018-07-22
  Filled 2018-08-05: qty 180, 30d supply, fill #0

## 2018-07-21 NOTE — Unmapped (Signed)
Per Dr. Toni Arthurs increase Prograf to 3mg  BID    Called patient with the use of [place] interpreter [phone]    Patient verbalized understanding to increase Prograf to 3mg  BID.    Reports he got shingles again and went to the clinic. Reports he had rash to stomach into lower back.  Started Valtrex Saturday and already feeling better

## 2018-07-22 MED ORDER — PROGRAF 1 MG CAPSULE
ORAL_CAPSULE | Freq: Two times a day (BID) | ORAL | 11 refills | 0 days | Status: CP
Start: 2018-07-22 — End: 2018-12-04

## 2018-07-22 NOTE — Unmapped (Signed)
PROGRAF DOSE INCREASE   TEST CLAIM $0.00

## 2018-07-22 NOTE — Unmapped (Signed)
New rx received at [facility] for prograf dose increase to 3mg  bid.  Per epic note 1/13, this was communicated to patient via coordinator and he verbalized understanding.  Patient is already set up for refill call this week, will not adjust date at this time.

## 2018-07-24 LAB — TACROLIMUS, TROUGH: Lab: 5.3

## 2018-07-30 ENCOUNTER — Ambulatory Visit: Admit: 2018-07-30 | Discharge: 2018-07-31 | Payer: MEDICARE

## 2018-07-30 DIAGNOSIS — Z0120 Encounter for dental examination and cleaning without abnormal findings: Principal | ICD-10-CM

## 2018-07-30 DIAGNOSIS — Z940 Kidney transplant status: Principal | ICD-10-CM

## 2018-07-30 LAB — CBC W/ AUTO DIFF
BASOPHILS ABSOLUTE COUNT: 0 10*9/L (ref 0.0–0.1)
EOSINOPHILS ABSOLUTE COUNT: 0.1 10*9/L (ref 0.0–0.4)
EOSINOPHILS RELATIVE PERCENT: 1.2 %
HEMATOCRIT: 42.2 % (ref 41.0–53.0)
HEMOGLOBIN: 13.3 g/dL — ABNORMAL LOW (ref 13.5–17.5)
LARGE UNSTAINED CELLS: 2 % (ref 0–4)
LYMPHOCYTES ABSOLUTE COUNT: 1 10*9/L — ABNORMAL LOW (ref 1.5–5.0)
LYMPHOCYTES RELATIVE PERCENT: 22.2 %
MEAN CORPUSCULAR HEMOGLOBIN CONC: 31.6 g/dL (ref 31.0–37.0)
MEAN CORPUSCULAR HEMOGLOBIN: 30.1 pg (ref 26.0–34.0)
MEAN CORPUSCULAR VOLUME: 95.3 fL (ref 80.0–100.0)
MEAN PLATELET VOLUME: 8.7 fL (ref 7.0–10.0)
MONOCYTES ABSOLUTE COUNT: 0.7 10*9/L (ref 0.2–0.8)
MONOCYTES RELATIVE PERCENT: 15.7 %
NEUTROPHILS ABSOLUTE COUNT: 2.7 10*9/L (ref 2.0–7.5)
NEUTROPHILS RELATIVE PERCENT: 58.5 %
PLATELET COUNT: 221 10*9/L (ref 150–440)
RED CELL DISTRIBUTION WIDTH: 14.9 % (ref 12.0–15.0)
WBC ADJUSTED: 4.6 10*9/L (ref 4.5–11.0)

## 2018-07-30 LAB — RENAL FUNCTION PANEL
ALBUMIN: 4.2 g/dL (ref 3.5–5.0)
ANION GAP: 8 mmol/L (ref 7–15)
BLOOD UREA NITROGEN: 23 mg/dL — ABNORMAL HIGH (ref 7–21)
BUN / CREAT RATIO: 12
CALCIUM: 9.9 mg/dL (ref 8.5–10.2)
CHLORIDE: 103 mmol/L (ref 98–107)
CO2: 24 mmol/L (ref 22.0–30.0)
CREATININE: 1.92 mg/dL — ABNORMAL HIGH (ref 0.70–1.30)
EGFR CKD-EPI AA MALE: 42 mL/min/{1.73_m2} — ABNORMAL LOW (ref >=60)
EGFR CKD-EPI NON-AA MALE: 37 mL/min/{1.73_m2} — ABNORMAL LOW (ref >=60)
GLUCOSE RANDOM: 98 mg/dL (ref 70–179)
PHOSPHORUS: 3.2 mg/dL (ref 2.9–4.7)
SODIUM: 135 mmol/L (ref 135–145)

## 2018-07-30 LAB — MAGNESIUM: Magnesium:MCnc:Pt:Ser/Plas:Qn:: 1.6

## 2018-07-30 LAB — CALCIUM: Calcium:MCnc:Pt:Ser/Plas:Qn:: 9.9

## 2018-07-30 LAB — NEUTROPHILS ABSOLUTE COUNT: Lab: 2.7

## 2018-07-30 LAB — TACROLIMUS, TROUGH: Lab: 7.2

## 2018-07-30 NOTE — Unmapped (Signed)
Justin Mcknight is a 63 yr old Hispanic male who presents today for Exam/prophy.    Medical history:   Patient Active Problem List   Diagnosis   ??? Hypertension   ??? Kidney transplant 09/03/2017   ??? Immunosuppression (CMS-HCC)   ??? Arteriovenous fistula stenosis (CMS-HCC)   Patient had Kidney Transplant Feb. 2019    Past Medical History:   Diagnosis Date   ??? End stage renal disease on dialysis (CMS-HCC)     started 09/2010   ??? Gout    ??? Hypertension    ??? Renal cell carcinoma (CMS-HCC)     03/2011   ??? Secondary hyperparathyroidism of renal origin (CMS-HCC)        Medications:   Current Outpatient Medications   Medication Sig Dispense Refill   ??? atorvastatin (LIPITOR) 10 MG tablet Take 1 tablet (10 mg total) by mouth daily. 30 tablet 11   ??? lisinopril (PRINIVIL,ZESTRIL) 5 MG tablet Take 2 tablets (10 mg total) by mouth every evening. 60 tablet 11   ??? MYFORTIC 180 mg EC tablet Take 3 tablets (540mg ) by mouth 2 times daily 180 tablet 11   ??? PROGRAF 1 mg capsule Take 3 capsules (3 mg total) by mouth two (2) times a day. 240 capsule 11     No current facility-administered medications for this visit.        Allergies:  Allergies   Allergen Reactions   ??? Codeine Swelling   (Med Hx and med lis reviewed with patient)     Social History:  Patient had Kidney Transplant  Feb. 2019    Blood Pressure 136/90                           Pulse  73    X-Rays: 4 BW's taken    Gingiva:  Pink, rolled margins, blunted papilla  Plaque present:  Moderate  Calculus present:  Moderate on mand anterior    TX:  Adult prophy - Cavitron and hand scaled, polished and flossed all contacts.   Exam by Dr. Jaynie Collins.  No decay noted.  EOE/IOE- no findings.  Oral Cancer Screening-WNL.  Oral Hygiene Instruction:  Reviewed brushing technique, instructed patient to brush at least 2 times daily and went over how to floss, and instructed patient to floss at night.     Next visit:  Recall 6 months (July 2020)    Justin Mcknight, RDH,BS

## 2018-07-31 NOTE — Unmapped (Signed)
I was immediately available via phone/pager or present on site.  I reviewed and discussed the case with the hygienist and examined the patient.  I agree with the assessment and plan as documented in the hygienist's note. Lequita Halt DDS

## 2018-08-01 NOTE — Unmapped (Signed)
[facility] Refill Coordination Note    Specialty Medication(s) to be Shipped:   Transplant: Myfortic 180mg  and Prograf 1mg   Other medication(s) to be shipped: Atorvastatin 10mg  & Lisnopril 5mg      [facility], DOB: 1955/10/15  Phone: [phone] (home)     All above HIPAA information was verified with patient.     Completed refill call assessment today to schedule patient's medication shipment from the [facility] [phone]).       Specialty medication(s) and dose(s) confirmed: Regimen is correct and unchanged.   Changes to medications: Macgregor reports no changes reported at this time.  Changes to insurance: No  Questions for the pharmacist: No    The patient will receive a drug information handout for each medication shipped and additional FDA Medication Guides as required.      DISEASE/MEDICATION-SPECIFIC INFORMATION        N/A    ADHERENCE     Medication Adherence    Patient reported X missed doses in the last month:  0  Specialty Medication:  Myfortic 180mg  & Prograf 1mg    Patient is on additional specialty medications:  No  Patient is on more than two specialty medications:  No  Any gaps in refill history greater than 2 weeks in the last 3 months:  no  Demonstrates understanding of importance of adherence:  yes  Informant:  patient  Reliability of informant:  reliable      Adherence tools used:  patient uses a pill box to manage medications      Support network for adherence:  family member      Confirmed plan for next specialty medication refill:  delivery by pharmacy          Refill Coordination    Has the Patients' Contact Information Changed:  No  Is the Shipping Address Different:  No         MEDICARE PART B DOCUMENTATION     Myfortic 180mg : Patient has 6 days worth of tablets on hand.  Prograf 1mg : Patient has 6 days worth of capsules on hand.    SHIPPING     Shipping address confirmed in Epic.     Delivery Scheduled: Yes, Expected medication delivery date: 08/06/2018 via UPS or courier.     Medication will be delivered via UPS to the home address in Epic [place].    Tamanna Whitson P Allena Katz   [facility] Specialty Technician

## 2018-08-04 ENCOUNTER — Ambulatory Visit: Admit: 2018-08-04 | Discharge: 2018-08-05 | Payer: MEDICARE

## 2018-08-04 DIAGNOSIS — Z940 Kidney transplant status: Principal | ICD-10-CM

## 2018-08-04 LAB — CBC W/ AUTO DIFF
BASOPHILS ABSOLUTE COUNT: 0 10*9/L (ref 0.0–0.1)
BASOPHILS RELATIVE PERCENT: 0.3 %
EOSINOPHILS ABSOLUTE COUNT: 0.1 10*9/L (ref 0.0–0.4)
EOSINOPHILS RELATIVE PERCENT: 2.3 %
HEMATOCRIT: 43.8 % (ref 41.0–53.0)
LARGE UNSTAINED CELLS: 2 % (ref 0–4)
LYMPHOCYTES ABSOLUTE COUNT: 1 10*9/L — ABNORMAL LOW (ref 1.5–5.0)
LYMPHOCYTES RELATIVE PERCENT: 16.9 %
MEAN CORPUSCULAR HEMOGLOBIN: 29.9 pg (ref 26.0–34.0)
MEAN CORPUSCULAR VOLUME: 95.4 fL (ref 80.0–100.0)
MEAN PLATELET VOLUME: 8.1 fL (ref 7.0–10.0)
MONOCYTES ABSOLUTE COUNT: 1.6 10*9/L — ABNORMAL HIGH (ref 0.2–0.8)
MONOCYTES RELATIVE PERCENT: 27.6 %
NEUTROPHILS ABSOLUTE COUNT: 3 10*9/L (ref 2.0–7.5)
NEUTROPHILS RELATIVE PERCENT: 50.8 %
PLATELET COUNT: 221 10*9/L (ref 150–440)
RED BLOOD CELL COUNT: 4.59 10*12/L (ref 4.50–5.90)
RED CELL DISTRIBUTION WIDTH: 16.7 % — ABNORMAL HIGH (ref 12.0–15.0)
WBC ADJUSTED: 5.9 10*9/L (ref 4.5–11.0)

## 2018-08-04 LAB — RENAL FUNCTION PANEL
ALBUMIN: 4.6 g/dL (ref 3.5–5.0)
ANION GAP: 13 mmol/L (ref 7–15)
BLOOD UREA NITROGEN: 27 mg/dL — ABNORMAL HIGH (ref 7–21)
BUN / CREAT RATIO: 15
CALCIUM: 10.2 mg/dL (ref 8.5–10.2)
CHLORIDE: 103 mmol/L (ref 98–107)
CO2: 25 mmol/L (ref 22.0–30.0)
CREATININE: 1.84 mg/dL — ABNORMAL HIGH (ref 0.70–1.30)
EGFR CKD-EPI NON-AA MALE: 39 mL/min/{1.73_m2} — ABNORMAL LOW (ref >=60)
GLUCOSE RANDOM: 109 mg/dL (ref 65–179)
POTASSIUM: 5.2 mmol/L — ABNORMAL HIGH (ref 3.5–5.0)
SODIUM: 141 mmol/L (ref 135–145)

## 2018-08-04 LAB — MAGNESIUM: Magnesium:MCnc:Pt:Ser/Plas:Qn:: 1.5 — ABNORMAL LOW

## 2018-08-04 LAB — TACROLIMUS, TROUGH: Lab: 5.5

## 2018-08-04 LAB — BASOPHILS RELATIVE PERCENT: Lab: 0.3

## 2018-08-04 LAB — CO2: Carbon dioxide:SCnc:Pt:Ser/Plas:Qn:: 25

## 2018-08-05 MED FILL — LISINOPRIL 5 MG TABLET: 30 days supply | Qty: 60 | Fill #2 | Status: AC

## 2018-08-05 MED FILL — PROGRAF 1 MG CAPSULE: 30 days supply | Qty: 180 | Fill #0 | Status: AC

## 2018-08-05 MED FILL — MYFORTIC 180 MG TABLET,DELAYED RELEASE: 30 days supply | Qty: 180 | Fill #5 | Status: AC

## 2018-08-05 MED FILL — ATORVASTATIN 10 MG TABLET: ORAL | 30 days supply | Qty: 30 | Fill #5

## 2018-08-05 MED FILL — ATORVASTATIN 10 MG TABLET: 30 days supply | Qty: 30 | Fill #5 | Status: AC

## 2018-08-05 MED FILL — MYFORTIC 180 MG TABLET,DELAYED RELEASE: ORAL | 30 days supply | Qty: 180 | Fill #5

## 2018-08-05 MED FILL — LISINOPRIL 5 MG TABLET: ORAL | 30 days supply | Qty: 60 | Fill #2

## 2018-08-12 ENCOUNTER — Encounter (INDEPENDENT_AMBULATORY_CARE_PROVIDER_SITE_OTHER): Payer: Self-pay | Admitting: Vascular Surgery

## 2018-08-12 ENCOUNTER — Ambulatory Visit (INDEPENDENT_AMBULATORY_CARE_PROVIDER_SITE_OTHER): Payer: Medicare Other | Admitting: Vascular Surgery

## 2018-08-12 VITALS — BP 126/82 | HR 103 | Resp 14 | Ht 68.0 in | Wt 183.2 lb

## 2018-08-12 DIAGNOSIS — I120 Hypertensive chronic kidney disease with stage 5 chronic kidney disease or end stage renal disease: Secondary | ICD-10-CM

## 2018-08-12 DIAGNOSIS — Z940 Kidney transplant status: Secondary | ICD-10-CM | POA: Diagnosis not present

## 2018-08-12 DIAGNOSIS — N186 End stage renal disease: Secondary | ICD-10-CM | POA: Diagnosis not present

## 2018-08-12 DIAGNOSIS — R2232 Localized swelling, mass and lump, left upper limb: Secondary | ICD-10-CM

## 2018-08-12 DIAGNOSIS — Z992 Dependence on renal dialysis: Secondary | ICD-10-CM

## 2018-08-12 DIAGNOSIS — I10 Essential (primary) hypertension: Secondary | ICD-10-CM | POA: Insufficient documentation

## 2018-08-12 NOTE — Assessment & Plan Note (Signed)
No longer on dialysis and not using any access at this point.

## 2018-08-12 NOTE — Assessment & Plan Note (Signed)
The patient has a persistent swollen mass in his left forearm near the previous radiocephalic anastomosis site.  Had the fistula ligated more proximally in the arm. It has the appearance that the fistula was ligated well distal to the anastomosis and there remains a large painful swelling near the anastomosis.  I think the best option at this point would be an angiogram to determine how vascular this masses as well as to ensure that his ulnar artery is providing blood flow to the hand should we consider resection of the mass.  I suspect that would require resection of the radial artery as well.  The angiogram was discussed in detail and the patient would like to have this performed.  It may be possible that endovascular therapy could be of benefit with this as well including covered stent placement or coil embolization, but I suspect that open surgical therapy may be required.

## 2018-08-12 NOTE — Progress Notes (Signed)
MRN : [id number]  Christopher Hickman is a 63 y.o. (02/28/1956) male who presents with chief complaint of  Chief Complaint  Patient presents with  . Follow-up  .  History of Present Illness: Patient returns today in follow up secondary to pain and swelling in his left forearm.  Since I have last seen him, the patient has gotten a kidney transplant and is no longer on dialysis.  He had an aneurysmal left forearm fistula but appears to have been at least partially resected at [facility].  However, near the anastomosis there is an egg-sized swelling and lump that is tender to palpation.  It is not overtly pulsatile.  His hand appears well perfused.  The incision in the more proximal forearm is well-healed.  It has the appearance that the fistula was ligated well distal to the anastomosis and there remains a large painful swelling near the anastomosis.  Causes pain into the hand and up the forearm.  It is tender to touch.  No bleeding or ulceration has been associated with this.  Current Outpatient Medications  Medication Sig Dispense Refill  . atorvastatin (LIPITOR) 10 MG tablet Take 1 tablet by mouth daily.    Marland Kitchen lisinopril (PRINIVIL,ZESTRIL) 5 MG tablet Take 2 tablets by mouth daily.    . mycophenolate (MYFORTIC) 180 MG EC tablet Take 3 tablets by mouth 2 (two) times daily.    . tacrolimus (PROGRAF) 1 MG capsule Take 3 capsules by mouth 2 (two) times daily.     No current facility-administered medications for this visit.     Past Medical History:  Diagnosis Date  . Anemia of chronic kidney failure   . End stage renal disease ([place])   . Glaucoma   . Gout   . Hypertension   . Right renal mass     Past Surgical History:  Procedure Laterality Date  . A/V FISTULAGRAM Left 07/26/2017   Procedure: A/V FISTULAGRAM;  Surgeon: Katha Cabal, MD;  Location: [facility];  Service: Cardiovascular;  Laterality: Left;  . CARDIAC CATHETERIZATION  07/2004   [facility]; ef60%  . left arm  av graft    . NEPHRECTOMY      Social History Social History   Tobacco Use  . Smoking status: Former Smoker    Start date: 2018  . Smokeless tobacco: Never Used  . Tobacco comment: occasional  Substance Use Topics  . Alcohol use: No  . Drug use: No    Family History Family History  Problem Relation Age of Onset  . Hypertension Mother   no bleeding disorders, clotting disorders, or autoimmune diseases  Allergies  Allergen Reactions  . Codeine     swelling     REVIEW OF SYSTEMS (Negative unless checked)  Constitutional: [] Weight loss  [] Fever  [] Chills Cardiac: [] Chest pain   [] Chest pressure   [] Palpitations   [] Shortness of breath when laying flat   [] Shortness of breath at rest   [] Shortness of breath with exertion. Vascular:  [] Pain in legs with walking   [] Pain in legs at rest   [] Pain in legs when laying flat   [] Claudication   [] Pain in feet when walking  [] Pain in feet at rest  [] Pain in feet when laying flat   [] History of DVT   [] Phlebitis   [] Swelling in legs   [] Varicose veins   [] Non-healing ulcers Pulmonary:   [] Uses home oxygen   [] Productive cough   [] Hemoptysis   [] Wheeze  [] COPD   [] Asthma  Neurologic:  [] Dizziness  [] Blackouts   [] Seizures   [] History of stroke   [] History of TIA  [] Aphasia   [] Temporary blindness   [] Dysphagia   [] Weakness or numbness in arms   [] Weakness or numbness in legs Musculoskeletal:  [x] Arthritis   [] Joint swelling   [] Joint pain   [] Low back pain Hematologic:  [] Easy bruising  [] Easy bleeding   [] Hypercoagulable state   [x] Anemic   Gastrointestinal:  [] Blood in stool   [] Vomiting blood  [] Gastroesophageal reflux/heartburn   [] Abdominal pain Genitourinary:  [x] Chronic kidney disease   [] Difficult urination  [] Frequent urination  [] Burning with urination   [] Hematuria Skin:  [] Rashes   [] Ulcers   [] Wounds Psychological:  [] History of anxiety   []  History of major depression.  Physical Examination  BP 126/82 (BP Location: Right  Arm, Patient Position: Sitting)   Pulse (!) 103   Resp 14   Ht 5\' 8"  (1.727 m)   Wt 183 lb 3.2 oz (83.1 kg)   BMI 27.86 kg/m  Gen:  WD/WN, NAD Head: [place]/AT, No temporalis wasting. Ear/Nose/Throat: Hearing grossly intact, nares w/o erythema or drainage Eyes: Conjunctiva clear. Sclera non-icteric Neck: Supple.  Trachea midline Pulmonary:  Good air movement, no use of accessory muscles.  Cardiac: RRR, no JVD Vascular: There is an egg-shaped and sized lump near the original radiocephalic anastomotic incision.  This is distal to where the incision for the ligation of his fistula was performed. Vessel Right Left  Radial Palpable Palpable                   Musculoskeletal: M/S 5/5 throughout.  No deformity or atrophy.  Neurologic: Sensation grossly intact in extremities.  Symmetrical.  Speech is fluent.  Psychiatric: Judgment intact, Mood & affect appropriate for pt's clinical situation. Dermatologic: No rashes or ulcers noted.  No cellulitis or open wounds.       Labs No results found for this or any previous visit (from the past 2160 hour(s)).  Radiology No results found.  Assessment/Plan  Essential hypertension blood pressure control important in reducing the progression of atherosclerotic disease. On appropriate oral medications.   Renal transplant, status post No longer on dialysis and not using any access at this point.  ESRD (end stage renal disease) on dialysis ([place]) No longer on dialysis and not using any access at this point.  Arm mass, left The patient has a persistent swollen mass in his left forearm near the previous radiocephalic anastomosis site.  Had the fistula ligated more proximally in the arm. It has the appearance that the fistula was ligated well distal to the anastomosis and there remains a large painful swelling near the anastomosis.  I think the best option at this point would be an angiogram to determine how vascular this masses as well as to  ensure that his ulnar artery is providing blood flow to the hand should we consider resection of the mass.  I suspect that would require resection of the radial artery as well.  The angiogram was discussed in detail and the patient would like to have this performed.  It may be possible that endovascular therapy could be of benefit with this as well including covered stent placement or coil embolization, but I suspect that open surgical therapy may be required.    Leotis Pain, MD  08/12/2018 4:44 PM    This note was created with Dragon medical transcription system.  Any errors from dictation are purely unintentional

## 2018-08-12 NOTE — Assessment & Plan Note (Signed)
blood pressure control important in reducing the progression of atherosclerotic disease. On appropriate oral medications.  

## 2018-08-14 ENCOUNTER — Telehealth (INDEPENDENT_AMBULATORY_CARE_PROVIDER_SITE_OTHER): Payer: Self-pay

## 2018-08-14 NOTE — Telephone Encounter (Signed)
I attempted to contact the patient through the interpreter services Tonia Ghent, ID# [phone] ) the patient did not answer and I was unable to leave a message due to any callback to our office we would need an interpreter.

## 2018-08-15 ENCOUNTER — Encounter (INDEPENDENT_AMBULATORY_CARE_PROVIDER_SITE_OTHER): Payer: Self-pay

## 2018-08-18 ENCOUNTER — Ambulatory Visit: Admit: 2018-08-18 | Discharge: 2018-08-19 | Payer: MEDICARE

## 2018-08-18 DIAGNOSIS — Z940 Kidney transplant status: Principal | ICD-10-CM

## 2018-08-18 LAB — RENAL FUNCTION PANEL
ALBUMIN: 4.4 g/dL (ref 3.5–5.0)
ANION GAP: 10 mmol/L (ref 7–15)
BLOOD UREA NITROGEN: 25 mg/dL — ABNORMAL HIGH (ref 7–21)
BUN / CREAT RATIO: 14
CALCIUM: 9.7 mg/dL (ref 8.5–10.2)
CHLORIDE: 105 mmol/L (ref 98–107)
CO2: 22 mmol/L (ref 22.0–30.0)
CREATININE: 1.77 mg/dL — ABNORMAL HIGH (ref 0.70–1.30)
EGFR CKD-EPI NON-AA MALE: 41 mL/min/{1.73_m2} — ABNORMAL LOW (ref >=60)
GLUCOSE RANDOM: 113 mg/dL (ref 65–179)
PHOSPHORUS: 3.5 mg/dL (ref 2.9–4.7)
POTASSIUM: 5.3 mmol/L — ABNORMAL HIGH (ref 3.5–5.0)
SODIUM: 137 mmol/L (ref 135–145)

## 2018-08-18 LAB — EOSINOPHILS RELATIVE PERCENT: Lab: 1.2

## 2018-08-18 LAB — CREATININE: Creatinine:MCnc:Pt:Ser/Plas:Qn:: 1.77 — ABNORMAL HIGH

## 2018-08-18 LAB — CBC W/ AUTO DIFF
BASOPHILS RELATIVE PERCENT: 2.7 %
EOSINOPHILS ABSOLUTE COUNT: 0.1 10*9/L (ref 0.0–0.4)
EOSINOPHILS RELATIVE PERCENT: 1.2 %
HEMATOCRIT: 39.2 % — ABNORMAL LOW (ref 41.0–53.0)
HEMOGLOBIN: 13 g/dL — ABNORMAL LOW (ref 13.5–17.5)
LYMPHOCYTES ABSOLUTE COUNT: 0.8 10*9/L — ABNORMAL LOW (ref 1.5–5.0)
LYMPHOCYTES RELATIVE PERCENT: 14.5 %
MEAN CORPUSCULAR HEMOGLOBIN CONC: 33.2 g/dL (ref 31.0–37.0)
MEAN CORPUSCULAR HEMOGLOBIN: 30.2 pg (ref 26.0–34.0)
MONOCYTES ABSOLUTE COUNT: 0.4 10*9/L (ref 0.2–0.8)
MONOCYTES RELATIVE PERCENT: 7.9 %
NEUTROPHILS ABSOLUTE COUNT: 3.8 10*9/L (ref 2.0–7.5)
NEUTROPHILS RELATIVE PERCENT: 73.7 %
PLATELET COUNT: 293 10*9/L (ref 150–440)
RED BLOOD CELL COUNT: 4.3 10*12/L — ABNORMAL LOW (ref 4.50–5.90)
RED CELL DISTRIBUTION WIDTH: 13.7 % (ref 12.0–15.0)
WBC ADJUSTED: 5.2 10*9/L (ref 4.5–11.0)

## 2018-08-18 LAB — TACROLIMUS, TROUGH: Lab: 5.6

## 2018-08-18 LAB — SMEAR REVIEW

## 2018-08-18 LAB — MAGNESIUM
MAGNESIUM: 1.6 mg/dL (ref 1.6–2.2)
Magnesium:MCnc:Pt:Ser/Plas:Qn:: 1.6

## 2018-08-20 ENCOUNTER — Other Ambulatory Visit (INDEPENDENT_AMBULATORY_CARE_PROVIDER_SITE_OTHER): Payer: Self-pay | Admitting: Nurse Practitioner

## 2018-08-20 MED ORDER — DEXTROSE 5 % IV SOLN
2.0000 g | Freq: Once | INTRAVENOUS | Status: DC
  Filled 2018-08-20: qty 20

## 2018-08-21 ENCOUNTER — Encounter: Payer: Self-pay | Admitting: *Deleted

## 2018-08-21 ENCOUNTER — Encounter
Admission: RE | Disposition: A | Discharge status: Home / Self Care | Payer: Self-pay | Source: Home / Self Care | Attending: Vascular Surgery

## 2018-08-21 ENCOUNTER — Other Ambulatory Visit: Payer: Self-pay

## 2018-08-21 ENCOUNTER — Ambulatory Visit
Admission: RE | Admit: 2018-08-21 | Discharge: 2018-08-21 | Disposition: A | Discharge status: Home / Self Care | Payer: Medicare Other | Attending: Vascular Surgery | Admitting: Vascular Surgery

## 2018-08-21 DIAGNOSIS — Z940 Kidney transplant status: Secondary | ICD-10-CM | POA: Insufficient documentation

## 2018-08-21 DIAGNOSIS — I120 Hypertensive chronic kidney disease with stage 5 chronic kidney disease or end stage renal disease: Secondary | ICD-10-CM | POA: Insufficient documentation

## 2018-08-21 DIAGNOSIS — T82510A Breakdown (mechanical) of surgically created arteriovenous fistula, initial encounter: Secondary | ICD-10-CM | POA: Insufficient documentation

## 2018-08-21 DIAGNOSIS — Y841 Kidney dialysis as the cause of abnormal reaction of the patient, or of later complication, without mention of misadventure at the time of the procedure: Secondary | ICD-10-CM | POA: Diagnosis not present

## 2018-08-21 DIAGNOSIS — N186 End stage renal disease: Secondary | ICD-10-CM

## 2018-08-21 DIAGNOSIS — M79602 Pain in left arm: Secondary | ICD-10-CM | POA: Insufficient documentation

## 2018-08-21 DIAGNOSIS — R2232 Localized swelling, mass and lump, left upper limb: Secondary | ICD-10-CM | POA: Diagnosis not present

## 2018-08-21 HISTORY — PX: UPPER EXTREMITY ANGIOGRAPHY: CATH118270

## 2018-08-21 LAB — CREATININE, SERUM
CREATININE: 1.89 mg/dL — AB (ref 0.61–1.24)
GFR calc Af Amer: 43 mL/min — ABNORMAL LOW (ref >60)
GFR calc non Af Amer: 37 mL/min — ABNORMAL LOW (ref >60)

## 2018-08-21 LAB — BUN: BUN: 27 mg/dL — ABNORMAL HIGH (ref 8–23)

## 2018-08-21 SURGERY — UPPER EXTREMITY ANGIOGRAPHY
Anesthesia: Moderate Sedation | Laterality: Left

## 2018-08-21 MED ORDER — LIDOCAINE-EPINEPHRINE (PF) 1 %-1:200000 IJ SOLN
INTRAMUSCULAR | Status: AC
  Filled 2018-08-21: qty 30

## 2018-08-21 MED ORDER — FENTANYL CITRATE (PF) 100 MCG/2ML IJ SOLN
INTRAMUSCULAR | Status: DC | PRN
  Administered 2018-08-21 (×2): 50 ug via INTRAVENOUS

## 2018-08-21 MED ORDER — MIDAZOLAM HCL 2 MG/2ML IJ SOLN
INTRAMUSCULAR | Status: DC | PRN
  Administered 2018-08-21 (×2): 1 mg via INTRAVENOUS
  Administered 2018-08-21: 2 mg via INTRAVENOUS

## 2018-08-21 MED ORDER — FENTANYL CITRATE (PF) 100 MCG/2ML IJ SOLN
INTRAMUSCULAR | Status: AC
  Filled 2018-08-21: qty 2

## 2018-08-21 MED ORDER — HEPARIN SODIUM (PORCINE) 1000 UNIT/ML IJ SOLN
INTRAMUSCULAR | Status: DC | PRN
  Administered 2018-08-21: 3000 [IU] via INTRAVENOUS

## 2018-08-21 MED ORDER — MIDAZOLAM HCL 2 MG/ML PO SYRP: 8.0000 mg | ORAL_SOLUTION | Freq: Once | ORAL | Status: DC | PRN

## 2018-08-21 MED ORDER — DIPHENHYDRAMINE HCL 50 MG/ML IJ SOLN: 50.0000 mg | Freq: Once | INTRAMUSCULAR | Status: DC | PRN

## 2018-08-21 MED ORDER — HEPARIN (PORCINE) IN NACL 1000-0.9 UT/500ML-% IV SOLN
INTRAVENOUS | Status: AC
  Filled 2018-08-21: qty 1000

## 2018-08-21 MED ORDER — CEFAZOLIN SODIUM-DEXTROSE 2-4 GM/100ML-% IV SOLN
INTRAVENOUS | Status: AC
  Filled 2018-08-21: qty 100

## 2018-08-21 MED ORDER — SODIUM CHLORIDE 0.9 % IV SOLN
Freq: Once | INTRAVENOUS | Status: AC
  Administered 2018-08-21: 11:00:00 via INTRAVENOUS

## 2018-08-21 MED ORDER — FENTANYL CITRATE (PF) 100 MCG/2ML IJ SOLN: 12.5000 ug | Freq: Once | INTRAMUSCULAR | Status: DC | PRN

## 2018-08-21 MED ORDER — MIDAZOLAM HCL 5 MG/5ML IJ SOLN
INTRAMUSCULAR | Status: AC
  Filled 2018-08-21: qty 5

## 2018-08-21 MED ORDER — METHYLPREDNISOLONE SODIUM SUCC 125 MG IJ SOLR: 125.0000 mg | Freq: Once | INTRAMUSCULAR | Status: DC | PRN

## 2018-08-21 MED ORDER — SODIUM CHLORIDE 0.9 % IV SOLN
INTRAVENOUS | Status: DC
  Administered 2018-08-21: 10:00:00 via INTRAVENOUS

## 2018-08-21 MED ORDER — ONDANSETRON HCL 4 MG/2ML IJ SOLN: 4.0000 mg | Freq: Four times a day (QID) | INTRAMUSCULAR | Status: DC | PRN

## 2018-08-21 MED ORDER — CEFAZOLIN SODIUM-DEXTROSE 1-4 GM/50ML-% IV SOLN
1.0000 g | Freq: Once | INTRAVENOUS | Status: AC
  Administered 2018-08-21: 1 g via INTRAVENOUS

## 2018-08-21 MED ORDER — HEPARIN SODIUM (PORCINE) 1000 UNIT/ML IJ SOLN
INTRAMUSCULAR | Status: AC
  Filled 2018-08-21: qty 1

## 2018-08-21 MED ORDER — FAMOTIDINE 20 MG PO TABS: 40.0000 mg | ORAL_TABLET | Freq: Once | ORAL | Status: DC | PRN

## 2018-08-21 SURGICAL SUPPLY — 12 items
CATH ANGIO 5F 100CM .035 PIG (CATHETERS) ×2 IMPLANT
CATH BEACON 5 .035 100 H1 TIP (CATHETERS) ×2 IMPLANT
CATH PIG 70CM (CATHETERS) ×2 IMPLANT
DEVICE STARCLOSE SE CLOSURE (Vascular Products) ×2 IMPLANT
DEVICE TORQUE .025-.038 (MISCELLANEOUS) ×2 IMPLANT
GLIDEWIRE ADV .035X260CM (WIRE) ×2 IMPLANT
PACK ANGIOGRAPHY (CUSTOM PROCEDURE TRAY) ×2 IMPLANT
SHEATH BRITE TIP 5FRX11 (SHEATH) ×2 IMPLANT
SYR MEDRAD MARK V 150ML (SYRINGE) ×2 IMPLANT
TOWEL OR 17X26 4PK STRL BLUE (TOWEL DISPOSABLE) ×2 IMPLANT
TUBING CONTRAST HIGH PRESS 72 (TUBING) ×2 IMPLANT
WIRE J 3MM .035X145CM (WIRE) ×2 IMPLANT

## 2018-08-21 NOTE — H&P (Signed)
[facility] History & Physical Update  The patient was interviewed and re-examined.  The patient's previous History and Physical has been reviewed and is unchanged.  There is no change in the plan of care. We plan to proceed with the scheduled procedure.  Leotis Pain, MD  08/21/2018, 9:57 AM

## 2018-08-21 NOTE — OR Nursing (Signed)
Dr Lucky Cowboy notified of GFR, fluid bolus ordered and ancef dose changed to 1 G.

## 2018-08-21 NOTE — Discharge Instructions (Signed)
Sedacin consciente moderada en los adultos (Moderate Conscious Sedation, Adult) La sedacin es el uso de un medicamento para favorecer la relajacin y Runner, broadcasting/film/video y la ansiedad. La sedacin consciente moderada es un tipo de sedacin. Cuando se somete a una sedacin consciente moderada, disminuye su nivel de alerta habitual, pero igualmente puede responder a las instrucciones o al tacto, o a ambos. La sedacin consciente moderada se [place] durante procedimientos mdicos y dentales breves. Es ms leve que una sedacin profunda, que es un tipo de sedacin de la que no es fcil despertarse. Tambin es ms leve que la anestesia general, que es el uso de medicamentos para dejarlo inconsciente. La sedacin consciente moderada le permite volver a sus actividades habituales ms pronto. INFORME A SU MDICO:  Cualquier alergia que tenga.  Todos los [employer], incluidos vitaminas, hierbas, gotas oftlmicas, cremas y medicamentos de venta libre.  Uso de corticoides (por va oral o cremas).  Cualquier problema que usted o sus familiares hayan tenido con sedantes y anestsicos.  Enfermedades de la sangre que tenga.  Cirugas previas.  Cualquier afeccin que padezca, como apnea del sueo.  Si est embarazada o podra estarlo.  Consumo de cigarrillos, alcohol, marihuana o drogas. RIESGOS Y COMPLICACIONES En general, se trata de un procedimiento seguro. Sin embargo, pueden presentarse problemas, por ejemplo:  Recibir demasiado medicamento (sedacin excesiva).  Nuseas.  Reaccin alrgica a un medicamento.  Problemas para respirar. Si esto ocurre, es posible que se utilice un tubo respiratorio para ayudarlo a Ambulance person. El tubo se retirar cuando despierte y respire por su cuenta.  Problemas cardacos.  Problemas pulmonares. ANTES DEL PROCEDIMIENTO Mantenerse hidratado Siga las indicaciones del mdico acerca de la hidratacin, las cuales pueden incluir lo siguiente:  Hasta  2horas antes del procedimiento, puede beber lquidos transparentes, como agua, jugos frutales transparentes, caf negro y t solo. Restricciones en las comidas y bebidas [place] comidas y las bebidas, las cuales pueden incluir lo siguiente:  Ocho horas antes del procedimiento, deje de ingerir comidas o alimentos pesados, por ejemplo, carne, alimentos fritos o alimentos grasos.  Seis horas antes del procedimiento, deje de ingerir comidas o alimentos livianos, como tostadas o cereales.  Seis horas antes del procedimiento, deje de beber [place] o bebidas que [employer].  Dos horas antes del procedimiento, deje de beber lquidos transparentes. Medicamentos Consulte al mdico si debe hacer o no lo siguiente:  Quarry manager o suspender los medicamentos que toma habitualmente. Esto es muy importante si toma medicamentos para la diabetes o anticoagulantes.  Tomar medicamentos como aspirina e ibuprofeno. Estos medicamentos pueden tener un efecto anticoagulante en la [place]. No tome estos medicamentos antes del procedimiento si el mdico le indica que no lo haga. Pruebas y exmenes  Le harn un examen fsico.  Posiblemente deba realizarse anlisis de sangre para evaluar lo siguiente: ? Cmo estn funcionando los riones y Engineer, maintenance (IT). ? Con qu eficacia coagula la sangre. Instrucciones generales  Haga planes para que una persona lo lleve a su casa desde el hospital o la clnica.  Si se ir a su casa inmediatamente despus del procedimiento, planifique que alguien se quede con usted durante 24horas. PROCEDIMIENTO  Le colocarn una va intravenosa (IV) en una de las venas.  Se le administrar un medicamento para ayudarlo a que se relaje (sedante) a travs de la va intravenosa.  Se realizar el procedimiento mdico o dental. DESPUS DEL PROCEDIMIENTO  Le controlarn con frecuencia la presin arterial, la  frecuencia cardaca, la frecuencia respiratoria y  el nivel de oxgeno en la sangre hasta que haya desaparecido el efecto de los medicamentos administrados.  No conduzca durante 24horas. Esta informacin no tiene Marine scientist el consejo del mdico. Asegrese de hacerle al mdico cualquier pregunta que tenga. Document Released: 04/04/2005 Document Revised: 07/16/2014 Document Reviewed: 10/15/2015 Elsevier Interactive Patient Education  2019 Novinger Angiogram  Un angiograma es un procedimiento que se utiliza para examinar los vasos sanguneos. En este procedimiento, se inyecta un tinte de contraste a travs de un tubo delgado y Maryln Gottron (catter) colocado en una arteria. Se toman radiografas, las cuales muestran si hay una obstruccin o un problema en un vaso sanguneo. Pueden insertarle el catter en alguno de los siguientes lugares:  La zona de la ingle. Este es el ms frecuente.  El pliegue del brazo, cerca del codo.  Effie [employer]. Informe al [employer] acerca de lo siguiente:  Las [employer] tenga, incluidas alergias a Occupational hygienist o a tintes de [place].  Todos los [employer], incluidos vitaminas, hierbas, gotas oftlmicas, cremas y medicamentos de venta libre.  Cualquier problema previo que usted o los miembros de su familia hayan tenido con anestsicos.  Cualquier enfermedad de la sangre que tenga.  Cirugas previas a las que se someti.  Cualquier problema o insuficiencia renal que haya tenido.  Cualquier enfermedad que tenga.  Si est embarazada o podra estarlo.  Si est amamantando. Cules son los riesgos? En general, se trata de un procedimiento seguro. Sin embargo, pueden ocurrir complicaciones, por ejemplo:  Infeccin o hematoma en el rea de insercin del catter.  Dao a otras estructuras u rganos, lo que incluye ruptura de los vasos sanguneos o dao a las arterias.  Reaccin alrgica al tinte de [place].  Dao renal debido al tinte de [place].  Cogulos sanguneos que pueden provocar un accidente cerebrovascular o un infarto de miocardio. Qu ocurre antes del procedimiento? Mantenerse hidratado Siga las indicaciones del mdico acerca de la hidratacin, las cuales pueden incluir lo siguiente:  Hasta 2horas antes del procedimiento, puede beber lquidos transparentes, como agua, jugos frutales transparentes, caf negro y t solo. Restricciones en las comidas y bebidas Siga las indicaciones del mdico respecto de las comidas y bebidas, las cuales pueden incluir lo siguiente:  Ocho horas antes del procedimiento, deje de ingerir comidas o alimentos pesados, por ejemplo, carne, alimentos fritos o alimentos grasos.  Seis horas antes del procedimiento, deje de ingerir comidas o alimentos livianos, como tostadas o cereales.  Seis horas antes del procedimiento, deje de beber [place] o bebidas que [employer].  Dos horas antes del procedimiento, deje de beber lquidos transparentes. Instrucciones generales  Consulte al mdico si debe hacer o no lo siguiente: ? Cambiar o suspender los medicamentos que toma normalmente. Esto es importante si toma medicamentos para la diabetes o anticoagulantes. ? Tomar medicamentos como aspirina e ibuprofeno. Estos medicamentos pueden tener un efecto anticoagulante en la [place]. No tome estos medicamentos antes del procedimiento si el mdico le indica que no lo haga.  Pueden extraerle muestras de sangre.  Haga planes para que una persona lo lleve a su casa desde el hospital o la clnica.  Si se ir a su casa inmediatamente despus del procedimiento, planifique que alguien se quede con usted durante 24horas. Qu ocurre durante el procedimiento?  Para disminuir el riesgo de contraer una infeccin: ? El equipo mdico se lavar o se Transport planner. ? Le lavarn la piel con  jabn. ? Pueden rasurarle el rea alrededor del lugar de la insercin.  Deber recostarse boca arriba sobre  una camilla de radiografas. Si la camilla est inclinada, se lo sujetar a ella.  Le colocarn un tubo (catter) intravenoso en una de las venas.  Le colocarn electrodos en el pecho para controlar la frecuencia cardaca durante el procedimiento.  Le administrarn uno o ms de los siguientes medicamentos: ? Un medicamento para ayudarlo a relajarse (sedante). ? Un medicamento para adormecer la zona en la que se insertar el catter (anestesia local).  El catter se insertar en una arteria con un alambre gua. Se utilizar un tipo de radiografa (fluoroscopia) para guiar el catter hasta el vaso sanguneo que debe examinarse.  A continuacin, se inyectar un tinte de Engineer, water y se tomarn radiografas. El tinte de [place] cuando hay un estrechamiento o una obstruccin en los vasos sanguneos. Cuando le [employer] tinte de [place], puede que se sienta sofocado.  Despus de finalizar con la radiografa, se retirar el catter.  Se colocar un apsito (vendaje) sobre TEFL teacher en donde se insert Print production planner. Se ejercer presin para detener el sangrado. Este procedimiento puede variar segn el mdico y el hospital. Sander Nephew sucede despus del procedimiento?  Le controlarn la presin arterial, la frecuencia cardaca, la frecuencia respiratoria y Retail buyer de oxgeno en la sangre hasta que desaparezca el efecto de los medicamentos administrados.  Deber permanecer acostado en la camilla por varias horas. Si le insertaron el catter en la pierna, le indicarn que no doble esta pierna o que no cruce las piernas.  Le controlarn con frecuencia el rea de insercin y el pulso en el pie o en la [place].  Deber ingerir una gran cantidad de lquidos. Esto ayudar a eliminar el tinte de contraste del cuerpo.  Le harn anlisis de sangre y Economist.  Le harn un estudio para controlar la actividad elctrica del corazn (electrocardiograma).  No conduzca  durante 24horas si le administraron un sedante.  Es su responsabilidad retirar [employer] del procedimiento. Pregntele al mdico o a alguien del departamento donde se realice el procedimiento cundo estarn [employer]. Resumen  Un angiograma es un procedimiento que se [place] para examinar los vasos sanguneos.  En este procedimiento, se inyecta un tinte de contraste a travs de un tubo delgado y Maryln Gottron (catter) colocado en una arteria. A continuacin, se toman radiografas.  Antes del procedimiento, siga las indicaciones del mdico respecto de las restricciones de comidas y bebidas. Es posible que deba dejar de comer y beber varias horas antes del procedimiento.  Despus del procedimiento, deber permanecer acostado por varias horas y beber Ardelia Mems gran cantidad de lquido. Esta informacin no tiene Marine scientist el consejo del mdico. Asegrese de hacerle al mdico cualquier pregunta que tenga. Document Released: 04/04/2005 Document Revised: 01/15/2017 Document Reviewed: 10/25/2016 Elsevier Interactive Patient Education  2019 [employer].

## 2018-08-21 NOTE — Op Note (Signed)
OPERATIVE REPORT   PREOPERATIVE DIAGNOSIS: 1. End-stage renal disease, status post cadaveric renal transplant. 2.  Painful left arm mass following ligation of left arm AV fistula  POSTOPERATIVE DIAGNOSIS: Same as above  PROCEDURE PERFORMED: 1. Ultrasound guidance vascular access to left femoral artery. 2. Catheter placement to left brachial artery from left femoral approach 3. Thoracic aortogram and selective left upper extremity angiogram 4. StarClose closure device left femoral artery.  SURGEON: [name], MD  ANESTHESIA: Local with moderate conscious sedation for 30 minutes using 4 mg of Versed and 100 Mcg of Fentanyl  BLOOD LOSS: Minimal.  FLUOROSCOPY TIME:2.1 minutes  CONTRAST: 40 cc  INDICATION FOR PROCEDURE: This is a 63 y.o.male who presented to our office with a painful mass in the left forearm.  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  This happened after his kidney transplant when he was no longer using the fistula.  He still has a reasonable size lump near the radiocephalic anastomotic area that is painful as well as neuropathic symptoms into his fingers and hand.  Angiogram is performed to evaluate the perfusion to see if this is a vascular lesion and also to ensure that the ulnar artery is intact supplying the hand in case it has to be resected.  Risks and benefits are discussed. Informed consent was obtained.  DESCRIPTION OF PROCEDURE: The patient was brought to the vascular suite. Moderate conscious sedation was administered during a face to face encounter with the patient throughout the procedure with my supervision of the RN administering medicines and monitoring the patient's vital signs, pulse oximetry, telemetry and mental status throughout from the start of the procedure until the patient was taken to the recovery room.  Groins were shaved and prepped and sterile  surgical field was created. The left femoral head was localized with fluoroscopy and the right femoral artery was then visualized with ultrasound and found to be widely patent. It was then accessed under direct ultrasound guidance without difficulty with a Seldinger needle and a permanent image was recorded. A J-wire and 5-French sheath were then placed. Pigtail catheter was placed into the ascending aorta and a thoracic aortogram was then performed in the LAO projection. This demonstrated normal origins to the great vessels without significant proximal stenoses and a normal configuration of the great vessels. The patient was given 3000 units of intravenous heparin and a headhunter catheter was used to selectively cannulate the left subclavian artery without difficulty. This was then sequentially advanced to the brachial artery at the antecubital fossa.  Imaging throughout the left upper extremity was performed demonstrating no stenosis in the left subclavian or axillary arteries.  The left brachial artery did have a moderate stenosis in the 50 to 60% range as well as some mild aneurysmal degeneration just above the antecubital fossa.  The ulnar artery was the dominant flow to the hand and was continuous without focal stenosis into the hand with a patent palmar arch.  There are interosseous muscle also provided some flow distally.  The radial artery had no flow and appeared to be occluded from its origin.  There was clearly no endovascular option for treatment of this mass and if it needs to be resected, it is not vascular in nature and no compromise in blood flow the hand would be expected. The diagnostic catheter was removed. Oblique arteriogram was performed of the right femoral artery and StarClose closure device deployed in  the usual fashion with excellent hemostatic result. The patient tolerated the procedure well and was taken to the recovery room in stable condition.   Christopher Hickman 08/21/2018 12:21 PM

## 2018-08-25 ENCOUNTER — Ambulatory Visit: Admit: 2018-08-25 | Discharge: 2018-08-26 | Payer: MEDICARE

## 2018-08-25 DIAGNOSIS — Z940 Kidney transplant status: Principal | ICD-10-CM

## 2018-08-25 LAB — RENAL FUNCTION PANEL
ALBUMIN: 4.5 g/dL (ref 3.5–5.0)
ANION GAP: 10 mmol/L (ref 7–15)
BLOOD UREA NITROGEN: 25 mg/dL — ABNORMAL HIGH (ref 7–21)
BUN / CREAT RATIO: 15
CALCIUM: 10.1 mg/dL (ref 8.5–10.2)
CHLORIDE: 106 mmol/L (ref 98–107)
CO2: 24 mmol/L (ref 22.0–30.0)
CREATININE: 1.7 mg/dL — ABNORMAL HIGH (ref 0.70–1.30)
EGFR CKD-EPI NON-AA MALE: 43 mL/min/{1.73_m2} — ABNORMAL LOW (ref >=60)
GLUCOSE RANDOM: 117 mg/dL (ref 65–179)
PHOSPHORUS: 3.5 mg/dL (ref 2.9–4.7)
POTASSIUM: 5.2 mmol/L — ABNORMAL HIGH (ref 3.5–5.0)
SODIUM: 140 mmol/L (ref 135–145)

## 2018-08-25 LAB — TACROLIMUS, TROUGH: Lab: 5

## 2018-08-25 LAB — CBC W/ AUTO DIFF
BASOPHILS ABSOLUTE COUNT: 0 10*9/L (ref 0.0–0.1)
BASOPHILS RELATIVE PERCENT: 0.4 %
EOSINOPHILS ABSOLUTE COUNT: 0.1 10*9/L (ref 0.0–0.4)
EOSINOPHILS RELATIVE PERCENT: 1.6 %
HEMATOCRIT: 40.2 % — ABNORMAL LOW (ref 41.0–53.0)
HEMOGLOBIN: 13.1 g/dL — ABNORMAL LOW (ref 13.5–17.5)
LARGE UNSTAINED CELLS: 1 % (ref 0–4)
LYMPHOCYTES ABSOLUTE COUNT: 1 10*9/L — ABNORMAL LOW (ref 1.5–5.0)
LYMPHOCYTES RELATIVE PERCENT: 13.6 %
MEAN CORPUSCULAR HEMOGLOBIN CONC: 32.5 g/dL (ref 31.0–37.0)
MEAN CORPUSCULAR HEMOGLOBIN: 31.1 pg (ref 26.0–34.0)
MEAN CORPUSCULAR VOLUME: 95.7 fL (ref 80.0–100.0)
MEAN PLATELET VOLUME: 7.7 fL (ref 7.0–10.0)
MONOCYTES RELATIVE PERCENT: 21.5 %
NEUTROPHILS ABSOLUTE COUNT: 4.6 10*9/L (ref 2.0–7.5)
NEUTROPHILS RELATIVE PERCENT: 61.6 %
PLATELET COUNT: 326 10*9/L (ref 150–440)
RED BLOOD CELL COUNT: 4.2 10*12/L — ABNORMAL LOW (ref 4.50–5.90)
RED CELL DISTRIBUTION WIDTH: 15.2 % — ABNORMAL HIGH (ref 12.0–15.0)
WBC ADJUSTED: 7.5 10*9/L (ref 4.5–11.0)

## 2018-08-25 LAB — BUN / CREAT RATIO: Urea nitrogen/Creatinine:MRto:Pt:Ser/Plas:Qn:: 15

## 2018-08-25 LAB — MAGNESIUM: Magnesium:MCnc:Pt:Ser/Plas:Qn:: 1.6

## 2018-08-25 LAB — LYMPHOCYTES RELATIVE PERCENT: Lab: 13.6

## 2018-09-01 NOTE — Unmapped (Signed)
[facility] Refill Coordination Note    Specialty Medication(s) to be Shipped:   Transplant: Myfortic 180mg  and Prograf 1mg     Other medication(s) to be shipped: lisinopril, atorvastatin     [facility] [place]: [birth year]  Phone: [phone] (home)       All above HIPAA information was verified with patient.     Completed refill call assessment today to schedule patient's medication shipment from the [facility] [phone]).       Specialty medication(s) and dose(s) confirmed: Regimen is correct and unchanged.   Changes to medications: Erubiel reports no changes reported at this time.  Changes to insurance: No  Questions for the pharmacist: No    The patient will receive a drug information handout for each medication shipped and additional FDA Medication Guides as required.      DISEASE/MEDICATION-SPECIFIC INFORMATION        N/A    ADHERENCE     Medication Adherence    Patient reported X missed doses in the last month:  0  Specialty Medication:  Myfortic 180mg   Patient is on additional specialty medications:  Yes  Additional Specialty Medications:  Prograf 1mg   Patient Reported Additional Medication X Missed Doses in the Last Month:  0  Patient is on more than two specialty medications:  No  Adherence tools used:  patient uses a pill box to manage medications  Support network for adherence:  family member              MEDICARE PART B DOCUMENTATION     Mycophenolic acid 180mg : Patient has 3 days worth of tablets on hand.  Prograf 1mg : Patient has 3 days worth of capsules on hand.    SHIPPING     Shipping address confirmed in Epic.     Delivery Scheduled: Yes, Expected medication delivery date: 09/03/18 via UPS or courier.     Medication will be delivered via UPS to the home address in Epic WAM.    Arnold Long   [facility] Specialty Pharmacist

## 2018-09-02 ENCOUNTER — Ambulatory Visit: Admit: 2018-09-02 | Discharge: 2018-09-02 | Payer: MEDICARE

## 2018-09-02 ENCOUNTER — Institutional Professional Consult (permissible substitution): Admit: 2018-09-02 | Discharge: 2018-09-02 | Payer: MEDICARE

## 2018-09-02 DIAGNOSIS — Z940 Kidney transplant status: Principal | ICD-10-CM

## 2018-09-02 DIAGNOSIS — T82858S Stenosis of vascular prosthetic devices, implants and grafts, sequela: Secondary | ICD-10-CM

## 2018-09-02 DIAGNOSIS — D899 Disorder involving the immune mechanism, unspecified: Secondary | ICD-10-CM

## 2018-09-02 LAB — BASIC METABOLIC PANEL
ANION GAP: 12 mmol/L (ref 7–15)
BLOOD UREA NITROGEN: 25 mg/dL — ABNORMAL HIGH (ref 7–21)
CALCIUM: 10.3 mg/dL — ABNORMAL HIGH (ref 8.5–10.2)
CHLORIDE: 105 mmol/L (ref 98–107)
CO2: 21 mmol/L — ABNORMAL LOW (ref 22.0–30.0)
CREATININE: 1.93 mg/dL — ABNORMAL HIGH (ref 0.70–1.30)
EGFR CKD-EPI NON-AA MALE: 37 mL/min/{1.73_m2} — ABNORMAL LOW (ref >=60)
GLUCOSE RANDOM: 105 mg/dL (ref 70–179)
POTASSIUM: 5.8 mmol/L — ABNORMAL HIGH (ref 3.5–5.0)
SODIUM: 138 mmol/L (ref 135–145)

## 2018-09-02 LAB — RENAL FUNCTION PANEL
ALBUMIN: 4.5 g/dL (ref 3.5–5.0)
ANION GAP: 14 mmol/L (ref 7–15)
BLOOD UREA NITROGEN: 25 mg/dL — ABNORMAL HIGH (ref 7–21)
BUN / CREAT RATIO: 13
CALCIUM: 10.1 mg/dL (ref 8.5–10.2)
CHLORIDE: 104 mmol/L (ref 98–107)
CREATININE: 1.96 mg/dL — ABNORMAL HIGH (ref 0.70–1.30)
EGFR CKD-EPI AA MALE: 41 mL/min/{1.73_m2} — ABNORMAL LOW (ref >=60)
GLUCOSE RANDOM: 105 mg/dL (ref 70–179)
PHOSPHORUS: 3.9 mg/dL (ref 2.9–4.7)
POTASSIUM: 6 mmol/L — ABNORMAL HIGH (ref 3.5–5.0)
SODIUM: 141 mmol/L (ref 135–145)

## 2018-09-02 LAB — URINALYSIS
BACTERIA: NONE SEEN /HPF
BILIRUBIN UA: NEGATIVE
BLOOD UA: NEGATIVE
GLUCOSE UA: NEGATIVE
KETONES UA: NEGATIVE
LEUKOCYTE ESTERASE UA: NEGATIVE
NITRITE UA: NEGATIVE
RBC UA: <1 /HPF (ref <=3)
SPECIFIC GRAVITY UA: 1.02 (ref 1.003–1.030)
SQUAMOUS EPITHELIAL: <1 /HPF (ref 0–5)
UROBILINOGEN UA: 0.2
WBC UA: 1 /HPF (ref <=2)

## 2018-09-02 LAB — CBC W/ AUTO DIFF
BASOPHILS ABSOLUTE COUNT: 0 10*9/L (ref 0.0–0.1)
BASOPHILS RELATIVE PERCENT: 0.6 %
EOSINOPHILS ABSOLUTE COUNT: 0 10*9/L (ref 0.0–0.4)
EOSINOPHILS RELATIVE PERCENT: 0.8 %
HEMATOCRIT: 42.9 % (ref 41.0–53.0)
HEMOGLOBIN: 13.2 g/dL — ABNORMAL LOW (ref 13.5–17.5)
LARGE UNSTAINED CELLS: 2 % (ref 0–4)
LYMPHOCYTES ABSOLUTE COUNT: 0.8 10*9/L — ABNORMAL LOW (ref 1.5–5.0)
LYMPHOCYTES RELATIVE PERCENT: 14.5 %
MEAN CORPUSCULAR HEMOGLOBIN CONC: 30.8 g/dL — ABNORMAL LOW (ref 31.0–37.0)
MEAN CORPUSCULAR HEMOGLOBIN: 30.1 pg (ref 26.0–34.0)
MEAN CORPUSCULAR VOLUME: 97.7 fL (ref 80.0–100.0)
MONOCYTES ABSOLUTE COUNT: 0.7 10*9/L (ref 0.2–0.8)
MONOCYTES RELATIVE PERCENT: 12.1 %
NEUTROPHILS ABSOLUTE COUNT: 3.8 10*9/L (ref 2.0–7.5)
NEUTROPHILS RELATIVE PERCENT: 70.4 %
PLATELET COUNT: 312 10*9/L (ref 150–440)
RED BLOOD CELL COUNT: 4.39 10*12/L — ABNORMAL LOW (ref 4.50–5.90)
RED CELL DISTRIBUTION WIDTH: 14.7 % (ref 12.0–15.0)
WBC ADJUSTED: 5.4 10*9/L (ref 4.5–11.0)

## 2018-09-02 LAB — RBC UA: Lab: <1

## 2018-09-02 LAB — TACROLIMUS, TROUGH: Lab: 5.2

## 2018-09-02 LAB — MAGNESIUM: Magnesium:MCnc:Pt:Ser/Plas:Qn:: 1.7

## 2018-09-02 LAB — CREATININE, URINE: Lab: 189.7

## 2018-09-02 LAB — PROTEIN / CREATININE RATIO, URINE: CREATININE, URINE: 189.7 mg/dL

## 2018-09-02 LAB — HEMATOCRIT: Lab: 42.9

## 2018-09-02 LAB — SMEAR REVIEW: Lab: 0

## 2018-09-02 LAB — ANION GAP: Anion gap 3:SCnc:Pt:Ser/Plas:Qn:: 14

## 2018-09-02 LAB — BUN / CREAT RATIO: Urea nitrogen/Creatinine:MRto:Pt:Ser/Plas:Qn:: 13

## 2018-09-02 LAB — PHOSPHORUS: Phosphate:MCnc:Pt:Ser/Plas:Qn:: 3.9

## 2018-09-02 MED ORDER — LISINOPRIL 5 MG TABLET
ORAL_TABLET | Freq: Every evening | ORAL | 11 refills | 30 days | Status: CP
Start: 2018-09-02 — End: 2019-09-02
  Filled 2018-09-02: qty 60, 30d supply, fill #3
  Filled 2018-09-26: qty 30, 30d supply, fill #0

## 2018-09-02 MED FILL — PROGRAF 1 MG CAPSULE: ORAL | 30 days supply | Qty: 180 | Fill #1

## 2018-09-02 MED FILL — LISINOPRIL 5 MG TABLET: 30 days supply | Qty: 60 | Fill #3 | Status: AC

## 2018-09-02 MED FILL — ATORVASTATIN 10 MG TABLET: 30 days supply | Qty: 30 | Fill #6 | Status: AC

## 2018-09-02 MED FILL — MYFORTIC 180 MG TABLET,DELAYED RELEASE: 30 days supply | Qty: 180 | Fill #6 | Status: AC

## 2018-09-02 MED FILL — PROGRAF 1 MG CAPSULE: 30 days supply | Qty: 180 | Fill #1 | Status: AC

## 2018-09-02 MED FILL — MYFORTIC 180 MG TABLET,DELAYED RELEASE: ORAL | 30 days supply | Qty: 180 | Fill #6

## 2018-09-02 MED FILL — ATORVASTATIN 10 MG TABLET: ORAL | 30 days supply | Qty: 30 | Fill #6

## 2018-09-02 NOTE — Unmapped (Signed)
[facility] transplant nephrology clinic visit    assessment and plan  1. s/p kidney transplant 09/03/2017. baseline creatinine 1.6-2 mg/dl. no proteinuria. no donor specific hla ab detected. no change in maintenance immunosuppression. tacrolimus 12hr lvl 5-7 ng/ml.   2. hypertension. lisinopril decr to 5mg  q.pm. +high diet potassium reported; moderation in potassium intake reviewed. repeat labs in 1wk. blood pressure goal < 130/80 mmhg.  3. hyperlipidemia. atorvastatin 10mg  daily.  4. preventive medicine. influenza '19. ppsv23 pneumococcal '12; booster recommended at clinic appt in 46m. pcv13 pneumococcal '19 and zoster '20 administered. abdomen ct '18; annual renal ultrasound to be scheduled. colonoscopy '14 with 5 year follow up recommended.    history of present illness    [name] is a 63 year old gentleman who is seen in follow up s/p kidney transplant 09/03/2017. he is seen with assistance of Dix Engineer, structural. +right flank varicella zoster treated with valacyclovir 1000mg  bid x3wks 12/19-1/20. he feels back at baseline lvl health. neuropathic pain resolved. no fevers chills or sweats. no headache. +occ orthostatic lightheaded. no chest pain palpitations or shortness of breath. no lower ext edema. +left forearm swelling with no aneurysm on left upper ext angiogram 2/13. appetite fair. no abdominal pain/n/v/d. no dysuria hematuria or difficulty voiding. all other systems reviewed and negative x10 systems.    past medical hx:  1. 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 hypertension. alemtuzumab induction. baseline creatinine 1.6-2 mg/dl.  > kidney bx 03/19: no acute rejection. +mild focal thrombotic microangiopathy.  > kidney bx 05/19: no acute rejection. no thrombotic microangiopathy.  2. hypertension  3. hx renal cell carcinoma s/p right nephrectomy '12.    past surgical hx: left forearm av fistula '12. right nephrectomy '12. kidney transplant '19. left forearm av fistula ligation '19.    allergies: codeine    medications: tacrolimus 3mg  bid, mycophenolate sodium 540mg  bid, lisinopril 10mg  daily, atorvastatin 10mg  daily.    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    physical exam: t98.2 p82 bp106/78 wt82.4kg bmi 23. wd/wn gentleman nl/appropriate affect and mood. nl sclera anicteric. mmm no thrush. neck supple no palpable ln. heart rrr nl s1s2 no m/r/g. lungs clear bilateral. abd soft nt/nd. no lower ext edema. msk no synovitis/tophi. skin +right mid flank dermatomal distribution hyperpigmentation/healed rash; no blister. neuro alert oriented non focal exam.    labs 09/02/18: wbc5.4 hgb13.2 hct42.9 plts312. ZO109 k5.8 cl105 bicarb21 bun25 cr1.9 glc105 ca10.3 mg1.7 phos3.9 albumin4.5. tacrolimus 12.5hr lvl 5.2 ng/ml. urinalysis no protein or blood.

## 2018-09-02 NOTE — Unmapped (Signed)
[facility] HOSPITALS TRANSPLANT CLINIC PHARMACY NOTE  09/02/2018   Justin Mcknight [facility]  [id number]    Medication changes today:   1. Administer PCV 13 and Shingrix in clinic    Education/Adherence tools provided today:  1.provided updated medication list  2.  provided additional education on immunosuppression and transplant related medications including reviewing indications of medications, dosing and side effects    Follow up items:  1. goal of understanding indications and dosing of immunosuppression medications  2. Consider starting Vitamin D supplementation    Next visit with pharmacy in PRN  ____________________________________________________________________    Justin Mcknight is a 63 y.o. 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     Other PMH significant for hypertension     Readmitted 3/7-09/13/17 post renal biopsy 2/2 acute hemoglobin drop and consider for bleeding.  Renal u/s showed perinephric hematomas.     Seen by pharmacy today for: medication management and pill box fill and adherence education; last seen by pharmacy 6 months ago.    CC:  Patient has no complaints today    Vitals:    09/02/18 0948   BP: 100/72   Pulse: 68   Temp: 36.8 ??C (98.2 ??F)       Allergies   Allergen Reactions   ??? Codeine Swelling     All medications reviewed and updated.     Medication list includes revisions made during today???s encounter    Outpatient Encounter Medications as of 09/02/2018   Medication Sig Dispense Refill   ??? atorvastatin (LIPITOR) 10 MG tablet Take 1 tablet (10 mg total) by mouth daily. 30 tablet 11   ??? MYFORTIC 180 mg EC tablet Take 3 tablets (540mg ) by mouth 2 times daily 180 tablet 11   ??? PROGRAF 1 mg capsule Take 3 capsules (3 mg total) by mouth two (2) times a day. 240 capsule 11   ??? [DISCONTINUED] lisinopril (PRINIVIL,ZESTRIL) 5 MG tablet Take 2 tablets (10 mg total) by mouth every evening. 60 tablet 11     No facility-administered encounter medications on file as of 09/02/2018. Induction agent : alemtuzumab    CURRENT IMMUNOSUPPRESSION: tacrolimus 6 mg PO BID  prograf/cyclosporine goal: 6-8   myfortic540  mg PO bid    steroid free      Patient is tolerating immunosuppression well .     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  WBC/ANC:  wnl    Plan: Continue current immunosuppression and adjust tac goal to 5-7 now that he's 1y post transplant. Continue to monitor.    ID prophylaxis:   CMV Status: D+/ R+, moderate risk . CMV prophylaxis: Valcyte 450 mg Mon/Thu x 3 months per protocol completed  Lab Results   Component Value Date    CMV Quant <50 (H) 02/25/2018     PCP Prophylaxis: Received pentamidine dose x1 on 3/1/9 prior to hospital  discharge and was transitioned back to Bactrim post hospital DC x 6 months.  Thrush: not completed inpatient 2/2 nystatin shortage  Patient is  off prophylaxis    Plan: Prophylaxis completed    CV Prophylaxis: Aspirin 81 mg held for thrombocytopenia on discharge from transplant and further held for hematoma post kidney biopsy  The 10-year ASCVD risk score Denman George DC Jr., et al., 2013) is: 4.7%   Statin therapy: Indicated; atorvastatin 10 mg daily   Plan: Continue to monitor    BP: Goal < 140/90. Clinic vitals reported above  Home BP ranges: 110-118/70-80  Current meds include: lisinopril 10 mg daily   Plan: Decrease lisinopril to 5 mg daily for low BP and high K+. Continue to monitor    Anemia:  H/H:   Lab Results Component Value Date    HGB 13.2 (L) 09/02/2018     Lab Results   Component Value Date    HCT 42.9 09/02/2018     Iron panel:  Lab Results   Component Value Date    IRON 35 11/03/2012    TIBC 261 11/03/2012    FERRITIN 860 (H) 11/03/2012     Lab Results   Component Value Date    Iron Saturation (%) 13 (L) 11/03/2012     Prior ESA use: none post transplant    Plan: stable. Continue to monitor.     DM:   Lab Results   Component Value Date    A1C 5.1 09/03/2017   . Goal A1c < 7  History of Dm? No  Plan: Continue to monitor    Electrolytes: K 6.0   Meds currently on: none  Plan: Repeat labs next week. Also decreasing lisinopril 5 mg  Continue to monitor     GI/BM: pt reports 1-2BM daily.    Meds currently on: none (pt stopped taking docusate and miralax)  Plan: Remove docusate and miralax from med list. Continue to monitor    Pain: pt reports no pain currently although did have significant pain with recent Shingles   Meds currently on: none  Plan: Continue to monitor    Bone health:   Vitamin D Level: 26.6 on 12/04/17. Goal > 30.   Last DEXA results:  none available  Current meds include: none -   Plan: Will hold off on starting vitamin D this time as vitamin D was slightly below goal and calcium is 10 Continue to monitor.     Women's/Men's Health:  Justin Mcknight is a 63 y.o. male. Patient reports no men's/women's health issues  Plan: Continue to monitor    Adherence: Patient has average understanding of medications; was able to independently identify names/doses of immunosuppressants and OI meds.  Patient  does fill their own pill box on a regular basis at home.    Patient brought medication card:yes   Did not bring pill box  Plan: Provided moderate adherence counseling/intervention    Spent approximately 30 minutes on educating this patient and greater than 50% was spent in direct face to face counseling regarding post transplant medication education. Questions and concerns were address to patient's satisfaction.    Patient was reviewed with Dr. Toni Arthurs who was agreement with the stated plan:     During this visit, the following was completed:   BG log data assessment  BP log data assessment  Labs ordered and evaluated  complex treatment plan >1 DS   Patient education was completed for 11-24 minutes     All questions/concerns were  addressed to the patient's satisfaction.  __________________________________________    PATIENT SEEN AND EVALUATED BY:   Cleone Slim, PHARMD  SOLID [employer]GAN TRANSPLANT  PAGER [phone]

## 2018-09-02 NOTE — Unmapped (Signed)
l/m for pt to c/b to sch app

## 2018-09-02 NOTE — Unmapped (Signed)
Saw patient in clinic. Reports he is doing well. Has right radial artery surgery at [facility] 2.13    Shingles in December and January to right upper flank into back. Healed now. Some pain with touch.    Labs today. Missed yesterday morning dose of medications but took Prograf at 8-9 last night.    Denies, HA, CP, abdominal pain, urinary problems, swelling, or fevers

## 2018-09-02 NOTE — Unmapped (Signed)
Called patient with use of pacific interpreter [phone] to discuss high potassium level from today's blood work.  Instructed patient to eat a low potassium diet and have his blood checked next week.  Went over foods high in potassium and patient reports that he has been eating a lot of beans and potatoes.  Verbalized understanding.    Denies any other questions

## 2018-09-02 NOTE — Unmapped (Signed)
l/m to c/b to sch appt

## 2018-09-03 LAB — CMV VIRAL LD: Lab: NOT DETECTED

## 2018-09-03 LAB — CMV DNA, QUANTITATIVE, PCR: CMV VIRAL LD: NOT DETECTED

## 2018-09-08 ENCOUNTER — Ambulatory Visit: Admit: 2018-09-08 | Discharge: 2018-09-09 | Payer: MEDICARE

## 2018-09-08 DIAGNOSIS — Z940 Kidney transplant status: Principal | ICD-10-CM

## 2018-09-08 LAB — RENAL FUNCTION PANEL
ALBUMIN: 4.4 g/dL (ref 3.5–5.0)
BLOOD UREA NITROGEN: 26 mg/dL — ABNORMAL HIGH (ref 7–21)
BUN / CREAT RATIO: 14
CALCIUM: 10.1 mg/dL (ref 8.5–10.2)
CO2: 26 mmol/L (ref 22.0–30.0)
CREATININE: 1.9 mg/dL — ABNORMAL HIGH (ref 0.70–1.30)
EGFR CKD-EPI AA MALE: 43 mL/min/{1.73_m2} — ABNORMAL LOW (ref >=60)
EGFR CKD-EPI NON-AA MALE: 37 mL/min/{1.73_m2} — ABNORMAL LOW (ref >=60)
GLUCOSE RANDOM: 110 mg/dL (ref 65–179)
PHOSPHORUS: 3.1 mg/dL (ref 2.9–4.7)
POTASSIUM: 5.8 mmol/L — ABNORMAL HIGH (ref 3.5–5.0)
SODIUM: 139 mmol/L (ref 135–145)
SODIUM: 139 mmol/L — ABNORMAL HIGH (ref 135–145)

## 2018-09-08 LAB — CBC W/ AUTO DIFF
BASOPHILS ABSOLUTE COUNT: 0 10*9/L (ref 0.0–0.1)
BASOPHILS RELATIVE PERCENT: 0.6 %
EOSINOPHILS ABSOLUTE COUNT: 0.1 10*9/L (ref 0.0–0.4)
EOSINOPHILS RELATIVE PERCENT: 2.1 %
HEMATOCRIT: 39.2 % — ABNORMAL LOW (ref 41.0–53.0)
HEMOGLOBIN: 12.9 g/dL — ABNORMAL LOW (ref 13.5–17.5)
LARGE UNSTAINED CELLS: 2 % (ref 0–4)
LARGE UNSTAINED CELLS: 2 % — ABNORMAL LOW (ref 0–4)
LYMPHOCYTES ABSOLUTE COUNT: 0.9 10*9/L — ABNORMAL LOW (ref 1.5–5.0)
LYMPHOCYTES RELATIVE PERCENT: 16.6 %
MEAN CORPUSCULAR HEMOGLOBIN CONC: 32.9 g/dL (ref 31.0–37.0)
MEAN CORPUSCULAR HEMOGLOBIN: 31.3 pg (ref 26.0–34.0)
MEAN CORPUSCULAR VOLUME: 95.2 fL (ref 80.0–100.0)
MEAN PLATELET VOLUME: 8.1 fL (ref 7.0–10.0)
MONOCYTES ABSOLUTE COUNT: 0.6 10*9/L (ref 0.2–0.8)
NEUTROPHILS ABSOLUTE COUNT: 3.8 10*9/L (ref 2.0–7.5)
PLATELET COUNT: 229 10*9/L (ref 150–440)
RED BLOOD CELL COUNT: 4.12 10*12/L — ABNORMAL LOW (ref 4.50–5.90)
RED CELL DISTRIBUTION WIDTH: 14.9 % (ref 12.0–15.0)
WBC ADJUSTED: 5.6 10*9/L (ref 4.5–11.0)

## 2018-09-08 LAB — TACROLIMUS LEVEL, TROUGH: TACROLIMUS, TROUGH: 5.8 ng/mL (ref 5.0–15.0)

## 2018-09-12 ENCOUNTER — Ambulatory Visit (INDEPENDENT_AMBULATORY_CARE_PROVIDER_SITE_OTHER): Payer: Medicare Other | Admitting: Vascular Surgery

## 2018-09-12 ENCOUNTER — Encounter (INDEPENDENT_AMBULATORY_CARE_PROVIDER_SITE_OTHER): Payer: Self-pay | Admitting: Vascular Surgery

## 2018-09-12 VITALS — BP 119/83 | HR 87 | Resp 16 | Ht 74.0 in | Wt 183.6 lb

## 2018-09-12 DIAGNOSIS — Z940 Kidney transplant status: Secondary | ICD-10-CM

## 2018-09-12 DIAGNOSIS — I10 Essential (primary) hypertension: Secondary | ICD-10-CM

## 2018-09-12 DIAGNOSIS — R2232 Localized swelling, mass and lump, left upper limb: Secondary | ICD-10-CM

## 2018-09-12 NOTE — Assessment & Plan Note (Signed)
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  Symptomatically much better.  Incision is healing well.  May return as needed

## 2018-09-12 NOTE — Progress Notes (Signed)
    Patient ID: Christopher Hickman, male   DOB: Oct 30, 1955, 63 y.o.   MRN: [id number]  Chief Complaint  Patient presents with  . Follow-up    3week post op    HPI Christopher Hickman is a 63 y.o. male.  Patient returns in follow-up of his left arm AV fistula aneurysm resection.  His arm is much better.  He has very mild swelling at the incision site but this is a marked improvement from prior to surgery.  His hand is no longer hurting.  He is very pleased.   Past Medical History:  Diagnosis Date  . Anemia of chronic kidney failure   . End stage renal disease ([place])   . Glaucoma   . Gout   . Hypertension   . Right renal mass     Past Surgical History:  Procedure Laterality Date  . A/V FISTULAGRAM Left 07/26/2017   Procedure: A/V FISTULAGRAM;  Surgeon: Katha Cabal, MD;  Location: [place] [facility];  Service: Cardiovascular;  Laterality: Left;  . CARDIAC CATHETERIZATION  07/2004   [facility]; ef60%  . KIDNEY TRANSPLANT     right   . left arm av graft    . NEPHRECTOMY    . UPPER EXTREMITY ANGIOGRAPHY Left 08/21/2018   Procedure: UPPER EXTREMITY ANGIOGRAPHY;  Surgeon: [name], MD;  Location: [place] [facility];  Service: Cardiovascular;  Laterality: Left;      Allergies  Allergen Reactions  . Codeine Swelling    Current Outpatient Medications  Medication Sig Dispense Refill  . acetaminophen (TYLENOL) 500 MG tablet Take 500 mg by mouth every 6 (six) hours as needed (for pain.).    Marland Kitchen atorvastatin (LIPITOR) 10 MG tablet Take 10 mg by mouth daily.     Marland Kitchen lisinopril (PRINIVIL,ZESTRIL) 5 MG tablet Take 10 mg by mouth every evening.     . mycophenolate (MYFORTIC) 180 MG EC tablet Take 540 mg by mouth 2 (two) times daily.     . tacrolimus (PROGRAF) 1 MG capsule Take 3 mg by mouth 2 (two) times daily.      No current facility-administered medications for this visit.         Physical Exam BP 119/83 (BP Location: Right Arm)   Pulse 87   Resp 16   Ht 6\' 2"  (1.88  m)   Wt 183 lb 9.6 oz (83.3 kg)   BMI 23.57 kg/m  Gen:  WD/WN, NAD Skin: incision C/D/I.  Swelling markedly improved.     Assessment/Plan:  Renal transplant, status post No longer needed his fistula and not on dialysis.  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  Symptomatically much better.  Incision is healing well.  May return as needed      Leotis Pain 09/12/2018, 9:32 AM   This note was created with Dragon medical transcription system.  Any errors from dictation are unintentional.

## 2018-09-12 NOTE — Assessment & Plan Note (Signed)
No longer needed his fistula and not on dialysis.

## 2018-09-15 ENCOUNTER — Ambulatory Visit: Admit: 2018-09-15 | Discharge: 2018-09-16 | Payer: MEDICARE

## 2018-09-15 DIAGNOSIS — Z940 Kidney transplant status: Principal | ICD-10-CM

## 2018-09-15 LAB — CBC W/ AUTO DIFF
BASOPHILS ABSOLUTE COUNT: 0.1 10*9/L (ref 0.0–0.1)
BASOPHILS RELATIVE PERCENT: 0.9 %
EOSINOPHILS ABSOLUTE COUNT: 0.2 10*9/L (ref 0.0–0.4)
EOSINOPHILS RELATIVE PERCENT: 2.9 %
HEMATOCRIT: 41.4 % (ref 41.0–53.0)
HEMOGLOBIN: 13.3 g/dL — ABNORMAL LOW (ref 13.5–17.5)
LARGE UNSTAINED CELLS: 2 % (ref 0–4)
LYMPHOCYTES ABSOLUTE COUNT: 0.9 10*9/L — ABNORMAL LOW (ref 1.5–5.0)
LYMPHOCYTES RELATIVE PERCENT: 13.5 %
LYMPHOCYTES RELATIVE PERCENT: 13.5 % — ABNORMAL LOW (ref 80.0–100.0)
MEAN CORPUSCULAR HEMOGLOBIN CONC: 32.1 g/dL (ref 31.0–37.0)
MEAN CORPUSCULAR HEMOGLOBIN: 30.9 pg (ref 26.0–34.0)
MEAN CORPUSCULAR VOLUME: 96.2 fL (ref 80.0–100.0)
MEAN PLATELET VOLUME: 7.6 fL (ref 7.0–10.0)
MONOCYTES RELATIVE PERCENT: 5.9 %
NEUTROPHILS ABSOLUTE COUNT: 4.9 10*9/L (ref 2.0–7.5)
NEUTROPHILS RELATIVE PERCENT: 75.3 %
PLATELET COUNT: 231 10*9/L (ref 150–440)
RED BLOOD CELL COUNT: 4.3 10*12/L — ABNORMAL LOW (ref 4.50–5.90)
RED CELL DISTRIBUTION WIDTH: 15.2 % — ABNORMAL HIGH (ref 12.0–15.0)
WBC ADJUSTED: 6.5 10*9/L (ref 4.5–11.0)

## 2018-09-15 LAB — RENAL FUNCTION PANEL
ALBUMIN: 4.7 g/dL (ref 3.5–5.0)
ANION GAP: 13 mmol/L (ref 7–15)
BLOOD UREA NITROGEN: 32 mg/dL — ABNORMAL HIGH (ref 7–21)
CALCIUM: 10.3 mg/dL — ABNORMAL HIGH (ref 8.5–10.2)
CHLORIDE: 104 mmol/L (ref 98–107)
CO2: 23 mmol/L (ref 22.0–30.0)
CREATININE: 1.99 mg/dL — ABNORMAL HIGH (ref 0.70–1.30)
EGFR CKD-EPI AA MALE: 41 mL/min/{1.73_m2} — ABNORMAL LOW (ref >=60)
EGFR CKD-EPI NON-AA MALE: 35 mL/min/{1.73_m2} — ABNORMAL LOW (ref >=60)
GLUCOSE RANDOM: 115 mg/dL (ref 65–179)
GLUCOSE RANDOM: 115 mg/dL — ABNORMAL HIGH (ref 65–179)
PHOSPHORUS: 3.5 mg/dL (ref 2.9–4.7)
POTASSIUM: 5.7 mmol/L — ABNORMAL HIGH (ref 3.5–5.0)
SODIUM: 140 mmol/L (ref 135–145)

## 2018-09-22 ENCOUNTER — Ambulatory Visit: Admit: 2018-09-22 | Discharge: 2018-09-23 | Payer: MEDICARE

## 2018-09-22 DIAGNOSIS — Z940 Kidney transplant status: Principal | ICD-10-CM

## 2018-09-22 LAB — RENAL FUNCTION PANEL
ALBUMIN: 4.6 g/dL (ref 3.5–5.0)
BLOOD UREA NITROGEN: 27 mg/dL — ABNORMAL HIGH (ref 7–21)
BUN / CREAT RATIO: 15
CALCIUM: 10.1 mg/dL (ref 8.5–10.2)
CHLORIDE: 103 mmol/L (ref 98–107)
CO2: 25 mmol/L (ref 22.0–30.0)
EGFR CKD-EPI AA MALE: 45 mL/min/{1.73_m2} — ABNORMAL LOW (ref >=60)
EGFR CKD-EPI AA MALE: 45 mL/min/1.73m2 — ABNORMAL LOW (ref >=60–5.0)
EGFR CKD-EPI NON-AA MALE: 39 mL/min/{1.73_m2} — ABNORMAL LOW (ref >=60)
GLUCOSE RANDOM: 113 mg/dL (ref 65–179)
PHOSPHORUS: 3.3 mg/dL (ref 2.9–4.7)
POTASSIUM: 5 mmol/L (ref 3.5–5.0)
SODIUM: 140 mmol/L (ref 135–145)

## 2018-09-22 LAB — CBC W/ AUTO DIFF
BASOPHILS ABSOLUTE COUNT: 0 10*9/L (ref 0.0–0.1)
BASOPHILS RELATIVE PERCENT: 0.7 %
EOSINOPHILS ABSOLUTE COUNT: 0.1 10*9/L (ref 0.0–0.4)
EOSINOPHILS RELATIVE PERCENT: 2 %
HEMOGLOBIN: 13 g/dL — ABNORMAL LOW (ref 13.5–17.5)
LARGE UNSTAINED CELLS: 2 % (ref 0–4)
LYMPHOCYTES ABSOLUTE COUNT: 0.9 10*9/L — ABNORMAL LOW (ref 1.5–5.0)
LYMPHOCYTES RELATIVE PERCENT: 15.6 %
MEAN CORPUSCULAR HEMOGLOBIN CONC: 31.7 g/dL (ref 31.0–37.0)
MEAN CORPUSCULAR HEMOGLOBIN: 30.5 pg (ref 26.0–34.0)
MEAN CORPUSCULAR VOLUME: 96.2 fL (ref 80.0–100.0)
MONOCYTES ABSOLUTE COUNT: 0.4 10*9/L (ref 0.2–0.8)
MONOCYTES ABSOLUTE COUNT: 0.4 10*9/L — ABNORMAL LOW (ref 0.2–0.8)
MONOCYTES RELATIVE PERCENT: 6.2 %
NEUTROPHILS ABSOLUTE COUNT: 4.3 10*9/L (ref 2.0–7.5)
NEUTROPHILS RELATIVE PERCENT: 73.5 %
PLATELET COUNT: 236 10*9/L (ref 150–440)
RED BLOOD CELL COUNT: 4.27 10*12/L — ABNORMAL LOW (ref 4.50–5.90)
RED CELL DISTRIBUTION WIDTH: 15.2 % — ABNORMAL HIGH (ref 12.0–15.0)
WBC ADJUSTED: 5.8 10*9/L (ref 4.5–11.0)

## 2018-09-25 DIAGNOSIS — Z940 Kidney transplant status: Principal | ICD-10-CM

## 2018-09-25 MED ORDER — MYFORTIC 180 MG TABLET,DELAYED RELEASE
ORAL_TABLET | Freq: Two times a day (BID) | ORAL | 11 refills | 30.00000 days | Status: CP
Start: 2018-09-25 — End: 2019-09-25
  Filled 2018-09-26: qty 180, 30d supply, fill #0

## 2018-09-25 NOTE — Unmapped (Signed)
[facility] Clinical Assessment & Refill Coordination Note    Justin Mcknight, Justin Mcknight: Jul 18, 1955  Phone: [phone] (home)     All above HIPAA information was verified with patient.     Specialty Medication(s):   Transplant: Myfortic 180mg  and Prograf 1mg      Current Outpatient Medications   Medication Sig Dispense Refill   ??? atorvastatin (LIPITOR) 10 MG tablet Take 1 tablet (10 mg total) by mouth daily. 30 tablet 11   ??? lisinopril (PRINIVIL,ZESTRIL) 5 MG tablet Take 1 tablet (5 mg total) by mouth every evening. 30 tablet 11   ??? MYFORTIC 180 mg EC tablet Take 3 tablets (540mg ) by mouth 2 times daily 180 tablet 11   ??? PROGRAF 1 mg capsule Take 3 capsules (3 mg total) by mouth two (2) times a day. 240 capsule 11     No current facility-administered medications for this visit.         Changes to medications: [name] reports no changes reported at this time.    Allergies   Allergen Reactions   ??? Codeine Swelling       Changes to allergies: No    SPECIALTY MEDICATION ADHERENCE     Myfortic 180 mg: 7 days of medicine on hand   Prograf 1 mg: 7 days of medicine on hand       Medication Adherence    Adherence tools used:  patient uses a pill box to manage medications  Support network for adherence:  family member          Specialty medication(s) dose(s) confirmed: Regimen is correct and unchanged.     Are there any concerns with adherence? No    Adherence counseling provided? Not needed    CLINICAL MANAGEMENT AND INTERVENTION      Clinical Benefit Assessment:    Do you feel the medicine is effective or helping your condition? Yes    Clinical Benefit counseling provided? Not needed    Adverse Effects Assessment:     Are you experiencing any side effects? No    Are you experiencing difficulty administering your medicine? No    Quality of Life Assessment:    How many days over the past month did your kidney transplant  keep you from your normal activities? For example, brushing your teeth or getting up in the morning. 0    Have you discussed this with your provider? Not needed    Therapy Appropriateness:    Is therapy appropriate? Yes, therapy is appropriate and should be continued    DISEASE/MEDICATION-SPECIFIC INFORMATION      N/A    PATIENT SPECIFIC NEEDS     ? Does the patient have any physical, cognitive, or cultural barriers? No    ? Is the patient high risk? Yes, patient taking a REMS drug     ? Does the patient require a Care Management Plan? No     ? Does the patient require physician intervention or other additional services (i.e. nutrition, smoking cessation, social work)? No      SHIPPING     Specialty Medication(s) to be Shipped:   Transplant: Myfortic 180mg  and Prograf 1mg     Other medication(s) to be shipped: Lisinopril, Atorvastatin     Changes to insurance: No    Delivery Scheduled: Yes, Expected medication delivery date: 09/29/2018.     Medication will be delivered via UPS to the confirmed home address in [facility].    The patient will receive a drug information  handout for each medication shipped and additional FDA Medication Guides as required.  Verified that patient has previously received a Conservation officer, historic buildings.    Tera Helper   [facility] Specialty Pharmacist

## 2018-09-26 MED FILL — MYFORTIC 180 MG TABLET,DELAYED RELEASE: 30 days supply | Qty: 180 | Fill #0 | Status: AC

## 2018-09-26 MED FILL — PROGRAF 1 MG CAPSULE: ORAL | 30 days supply | Qty: 180 | Fill #2

## 2018-09-26 MED FILL — LISINOPRIL 5 MG TABLET: 30 days supply | Qty: 30 | Fill #0 | Status: AC

## 2018-09-26 MED FILL — PROGRAF 1 MG CAPSULE: 30 days supply | Qty: 180 | Fill #2 | Status: AC

## 2018-09-26 MED FILL — ATORVASTATIN 10 MG TABLET: 30 days supply | Qty: 30 | Fill #7 | Status: AC

## 2018-09-26 MED FILL — ATORVASTATIN 10 MG TABLET: ORAL | 30 days supply | Qty: 30 | Fill #7

## 2018-10-13 NOTE — Unmapped (Signed)
Per Dr. Toni Arthurs patient can skip labs for the month of April.    Called patient via pacific interpreter [phone].    Updated patient that he does not need labs for April and to continue to limit potassium rich foods.    Patient verbalized understanding

## 2018-10-13 NOTE — Unmapped (Signed)
Called patient with pacific interpreter Vernona Rieger [phone].    Patient states he has not gotten labs because he is fearful of the coronavirus.    Went over labs last labs with patient. Reports he has still been eating foods low in potassium so his potassium doesn't go up.    Let him know I will reach out to Dr. Toni Arthurs and see when he wants him to go have labs.    Patient verbalized understanding     Denies any other needs at this time

## 2018-10-21 NOTE — Unmapped (Signed)
[facility] Refill Coordination Note    Specialty Medication(s) to be Shipped:   Transplant: Myfortic 180mg  and Prograf 1mg   Other medication(s) to be shipped: Atorvastatin 10mg  & Lisinopril 5mg      Justin Mcknight, DOB: 02/28/1956  Phone: [phone] (home)     All above HIPAA information was verified with patient.     Completed refill call assessment today to schedule patient's medication shipment from the [facility] [phone]).       Specialty medication(s) and dose(s) confirmed: Regimen is correct and unchanged.   Changes to medications: [name] reports no changes reported at this time.  Changes to insurance: No  Questions for the pharmacist: No    Confirmed patient received Welcome Packet with first shipment. The patient will receive a drug information handout for each medication shipped and additional FDA Medication Guides as required.       DISEASE/MEDICATION-SPECIFIC INFORMATION        N/A    SPECIALTY MEDICATION ADHERENCE     Medication Adherence    Patient reported X missed doses in the last month:  0  Specialty Medication:  Myfortic 180mg   Patient is on additional specialty medications:  Yes  Additional Specialty Medications:  Prograf 1mg    Patient Reported Additional Medication X Missed Doses in the Last Month:  0  Patient is on more than two specialty medications:  No  Any gaps in refill history greater than 2 weeks in the last 3 months:  no  Adherence tools used:  patient uses a pill box to manage medications  Support network for adherence:  family member        Myfortic 180 mg: 10 days of medicine on hand   Prograf  1 mg: 10 days of medicine on hand     SHIPPING     Shipping address confirmed in Epic.     Delivery Scheduled: Yes, Expected medication delivery date: 10/31/2018.     Medication will be delivered via UPS to the home address in [employer] [place].    [name] P Allena Katz   [facility] Specialty Technician

## 2018-10-30 MED FILL — ATORVASTATIN 10 MG TABLET: 30 days supply | Qty: 30 | Fill #8 | Status: AC

## 2018-10-30 MED FILL — MYFORTIC 180 MG TABLET,DELAYED RELEASE: 30 days supply | Qty: 180 | Fill #1 | Status: AC

## 2018-10-30 MED FILL — PROGRAF 1 MG CAPSULE: 30 days supply | Qty: 180 | Fill #3 | Status: AC

## 2018-10-30 MED FILL — MYFORTIC 180 MG TABLET,DELAYED RELEASE: ORAL | 30 days supply | Qty: 180 | Fill #1

## 2018-10-30 MED FILL — ATORVASTATIN 10 MG TABLET: ORAL | 30 days supply | Qty: 30 | Fill #8

## 2018-10-30 MED FILL — LISINOPRIL 5 MG TABLET: 30 days supply | Qty: 30 | Fill #1 | Status: AC

## 2018-10-30 MED FILL — LISINOPRIL 5 MG TABLET: ORAL | 30 days supply | Qty: 30 | Fill #1

## 2018-10-30 MED FILL — PROGRAF 1 MG CAPSULE: ORAL | 30 days supply | Qty: 180 | Fill #3

## 2018-11-12 ENCOUNTER — Ambulatory Visit: Admit: 2018-11-12 | Discharge: 2018-11-13 | Payer: MEDICARE

## 2018-11-12 DIAGNOSIS — Z940 Kidney transplant status: Principal | ICD-10-CM

## 2018-11-12 LAB — CBC W/ AUTO DIFF
BASOPHILS ABSOLUTE COUNT: 0.1 10*9/L (ref 0.0–0.1)
BASOPHILS RELATIVE PERCENT: 1.2 %
EOSINOPHILS ABSOLUTE COUNT: 0.1 10*9/L (ref 0.0–0.4)
HEMATOCRIT: 43.3 % (ref 41.0–53.0)
HEMOGLOBIN: 14.1 g/dL (ref 13.5–17.5)
LARGE UNSTAINED CELLS: 3 % (ref 0–4)
LYMPHOCYTES ABSOLUTE COUNT: 1 10*9/L — ABNORMAL LOW (ref 1.5–5.0)
LYMPHOCYTES RELATIVE PERCENT: 17.9 %
MEAN CORPUSCULAR HEMOGLOBIN CONC: 32.6 g/dL (ref 31.0–37.0)
MEAN CORPUSCULAR HEMOGLOBIN: 31.4 pg (ref 26.0–34.0)
MEAN CORPUSCULAR VOLUME: 96.1 fL (ref 80.0–100.0)
MEAN PLATELET VOLUME: 8.1 fL (ref 7.0–10.0)
MONOCYTES ABSOLUTE COUNT: 0.4 10*9/L (ref 0.2–0.8)
MONOCYTES RELATIVE PERCENT: 6.9 %
NEUTROPHILS ABSOLUTE COUNT: 3.9 10*9/L (ref 2.0–7.5)
PLATELET COUNT: 227 10*9/L (ref 150–440)
RED BLOOD CELL COUNT: 4.5 10*12/L (ref 4.50–5.90)
RED CELL DISTRIBUTION WIDTH: 14.2 % (ref 12.0–15.0)
WBC ADJUSTED: 5.6 10*9/L (ref 4.5–11.0)

## 2018-11-12 LAB — RENAL FUNCTION PANEL
ALBUMIN: 4.7 g/dL (ref 3.5–5.0)
ANION GAP: 12 mmol/L (ref 7–15)
BLOOD UREA NITROGEN: 36 mg/dL — ABNORMAL HIGH (ref 7–21)
BUN / CREAT RATIO: 19
CALCIUM: 10.2 mg/dL (ref 8.5–10.2)
CHLORIDE: 104 mmol/L (ref 98–107)
CO2: 23 mmol/L (ref 22.0–30.0)
EGFR CKD-EPI AA MALE: 42 mL/min/{1.73_m2} — ABNORMAL LOW (ref >=60)
EGFR CKD-EPI NON-AA MALE: 37 mL/min/{1.73_m2} — ABNORMAL LOW (ref >=60)
GLUCOSE RANDOM: 111 mg/dL (ref 70–179)
POTASSIUM: 5.5 mmol/L — ABNORMAL HIGH (ref 3.5–5.0)
SODIUM: 139 mmol/L (ref 135–145)

## 2018-11-12 LAB — LYMPHOCYTES ABSOLUTE COUNT: Lab: 1 — ABNORMAL LOW

## 2018-11-12 LAB — MAGNESIUM: Magnesium:MCnc:Pt:Ser/Plas:Qn:: 1.8

## 2018-11-12 LAB — CHLORIDE: Chloride:SCnc:Pt:Ser/Plas:Qn:: 104

## 2018-11-12 LAB — TACROLIMUS, TROUGH: Lab: 9.1

## 2018-11-18 ENCOUNTER — Ambulatory Visit: Admit: 2018-11-18 | Discharge: 2018-11-19 | Payer: MEDICARE

## 2018-11-18 DIAGNOSIS — Z940 Kidney transplant status: Principal | ICD-10-CM

## 2018-11-18 LAB — RENAL FUNCTION PANEL
ANION GAP: 11 mmol/L (ref 7–15)
BLOOD UREA NITROGEN: 28 mg/dL — ABNORMAL HIGH (ref 7–21)
BUN / CREAT RATIO: 14
CALCIUM: 10.3 mg/dL — ABNORMAL HIGH (ref 8.5–10.2)
CHLORIDE: 104 mmol/L (ref 98–107)
CO2: 26 mmol/L (ref 22.0–30.0)
CREATININE: 1.94 mg/dL — ABNORMAL HIGH (ref 0.70–1.30)
EGFR CKD-EPI NON-AA MALE: 36 mL/min/{1.73_m2} — ABNORMAL LOW (ref >=60)
GLUCOSE RANDOM: 113 mg/dL — ABNORMAL HIGH (ref 70–99)
PHOSPHORUS: 3.7 mg/dL (ref 2.9–4.7)
POTASSIUM: 5.4 mmol/L — ABNORMAL HIGH (ref 3.5–5.0)
SODIUM: 141 mmol/L (ref 135–145)

## 2018-11-18 LAB — CBC W/ AUTO DIFF
BASOPHILS ABSOLUTE COUNT: 0 10*9/L (ref 0.0–0.1)
BASOPHILS RELATIVE PERCENT: 0.9 %
EOSINOPHILS ABSOLUTE COUNT: 0.1 10*9/L (ref 0.0–0.4)
EOSINOPHILS RELATIVE PERCENT: 2.7 %
HEMATOCRIT: 45.4 % (ref 41.0–53.0)
HEMOGLOBIN: 14.3 g/dL (ref 13.5–17.5)
LARGE UNSTAINED CELLS: 2 % (ref 0–4)
LYMPHOCYTES ABSOLUTE COUNT: 0.9 10*9/L — ABNORMAL LOW (ref 1.5–5.0)
LYMPHOCYTES RELATIVE PERCENT: 18.7 %
MEAN CORPUSCULAR HEMOGLOBIN CONC: 31.6 g/dL (ref 31.0–37.0)
MEAN CORPUSCULAR HEMOGLOBIN: 30.4 pg (ref 26.0–34.0)
MEAN CORPUSCULAR VOLUME: 96.3 fL (ref 80.0–100.0)
MEAN PLATELET VOLUME: 8.1 fL (ref 7.0–10.0)
MONOCYTES ABSOLUTE COUNT: 0.3 10*9/L (ref 0.2–0.8)
MONOCYTES RELATIVE PERCENT: 5.4 %
PLATELET COUNT: 218 10*9/L (ref 150–440)
RED BLOOD CELL COUNT: 4.71 10*12/L (ref 4.50–5.90)
RED CELL DISTRIBUTION WIDTH: 14 % (ref 12.0–15.0)
WBC ADJUSTED: 4.9 10*9/L (ref 4.5–11.0)

## 2018-11-18 LAB — PLATELET COUNT: Lab: 218

## 2018-11-18 LAB — CREATININE: Creatinine:MCnc:Pt:Ser/Plas:Qn:: 1.94 — ABNORMAL HIGH

## 2018-11-18 LAB — TACROLIMUS, TROUGH: Lab: 8.3

## 2018-11-18 LAB — MAGNESIUM: Magnesium:MCnc:Pt:Ser/Plas:Qn:: 1.8

## 2018-11-20 NOTE — Unmapped (Signed)
[facility] Refill Coordination Note    Specialty Medication(s) to be Shipped:   Transplant: Myfortic 180mg  and Prograf 1mg   Other medication(s) to be shipped: Atorvastatin 10mg  & Lisinopril 5mg      Justin Mcknight, DOB: 05-Feb-1956  Phone: [phone] (home)     All above HIPAA information was verified with patient.     Completed refill call assessment today to schedule patient's medication shipment from the [facility] [phone]).       Specialty medication(s) and dose(s) confirmed: Regimen is correct and unchanged.   Changes to medications: Justin Mcknight reports no changes reported at this time.  Changes to insurance: No  Questions for the pharmacist: No    Confirmed patient received Welcome Packet with first shipment. The patient will receive a drug information handout for each medication shipped and additional FDA Medication Guides as required.       DISEASE/MEDICATION-SPECIFIC INFORMATION        N/A    SPECIALTY MEDICATION ADHERENCE     Medication Adherence    Patient reported X missed doses in the last month:  0  Specialty Medication:  Myfortic 180mg   Patient is on additional specialty medications:  Yes  Additional Specialty Medications:  Prograf 1mg    Patient Reported Additional Medication X Missed Doses in the Last Month:  0  Patient is on more than two specialty medications:  No  Adherence tools used:  patient uses a pill box to manage medications  Support network for adherence:  family member        Myfortic 180 mg: 9 days of medicine on hand   Prograf 1 mg: 9 days of medicine on hand     SHIPPING     Shipping address confirmed in Epic.     Delivery Scheduled: Yes, Expected medication delivery date: 11/28/2018.     Medication will be delivered via UPS to the home address in [employer] [place].    Justin Mcknight   [facility] Specialty Technician

## 2018-11-26 ENCOUNTER — Ambulatory Visit: Admit: 2018-11-26 | Discharge: 2018-11-27 | Payer: MEDICARE

## 2018-11-26 DIAGNOSIS — Z940 Kidney transplant status: Principal | ICD-10-CM

## 2018-11-26 LAB — CHLORIDE: Chloride:SCnc:Pt:Ser/Plas:Qn:: 107

## 2018-11-26 LAB — RENAL FUNCTION PANEL
ALBUMIN: 4.7 g/dL (ref 3.5–5.0)
ANION GAP: 10 mmol/L (ref 7–15)
BLOOD UREA NITROGEN: 25 mg/dL — ABNORMAL HIGH (ref 7–21)
BUN / CREAT RATIO: 14
CALCIUM: 10.2 mg/dL (ref 8.5–10.2)
CHLORIDE: 107 mmol/L (ref 98–107)
CO2: 24 mmol/L (ref 22.0–30.0)
CREATININE: 1.79 mg/dL — ABNORMAL HIGH (ref 0.70–1.30)
EGFR CKD-EPI AA MALE: 46 mL/min/{1.73_m2} — ABNORMAL LOW (ref >=60)
EGFR CKD-EPI NON-AA MALE: 40 mL/min/{1.73_m2} — ABNORMAL LOW (ref >=60)
GLUCOSE RANDOM: 117 mg/dL (ref 70–179)
POTASSIUM: 5.2 mmol/L — ABNORMAL HIGH (ref 3.5–5.0)
SODIUM: 141 mmol/L (ref 135–145)

## 2018-11-26 LAB — CBC W/ AUTO DIFF
BASOPHILS ABSOLUTE COUNT: 0 10*9/L (ref 0.0–0.1)
BASOPHILS RELATIVE PERCENT: 0.6 %
EOSINOPHILS ABSOLUTE COUNT: 0.1 10*9/L (ref 0.0–0.4)
EOSINOPHILS RELATIVE PERCENT: 1.6 %
HEMATOCRIT: 44.6 % (ref 41.0–53.0)
HEMOGLOBIN: 14.4 g/dL (ref 13.5–17.5)
LARGE UNSTAINED CELLS: 2 % (ref 0–4)
LYMPHOCYTES ABSOLUTE COUNT: 1 10*9/L — ABNORMAL LOW (ref 1.5–5.0)
LYMPHOCYTES RELATIVE PERCENT: 15.3 %
MEAN CORPUSCULAR HEMOGLOBIN CONC: 32.2 g/dL (ref 31.0–37.0)
MEAN CORPUSCULAR HEMOGLOBIN: 31 pg (ref 26.0–34.0)
MEAN CORPUSCULAR VOLUME: 96.3 fL (ref 80.0–100.0)
MONOCYTES ABSOLUTE COUNT: 0.3 10*9/L (ref 0.2–0.8)
MONOCYTES RELATIVE PERCENT: 5.2 %
NEUTROPHILS ABSOLUTE COUNT: 4.7 10*9/L (ref 2.0–7.5)
NEUTROPHILS RELATIVE PERCENT: 75.2 %
PLATELET COUNT: 206 10*9/L (ref 150–440)
RED BLOOD CELL COUNT: 4.64 10*12/L (ref 4.50–5.90)
WBC ADJUSTED: 6.3 10*9/L (ref 4.5–11.0)

## 2018-11-26 LAB — TACROLIMUS, TROUGH: Lab: 6.9

## 2018-11-26 LAB — MAGNESIUM: Magnesium:MCnc:Pt:Ser/Plas:Qn:: 1.8

## 2018-11-26 LAB — MONOCYTES ABSOLUTE COUNT: Lab: 0.3

## 2018-11-27 MED FILL — MYFORTIC 180 MG TABLET,DELAYED RELEASE: ORAL | 30 days supply | Qty: 180 | Fill #2

## 2018-11-27 MED FILL — PROGRAF 1 MG CAPSULE: ORAL | 30 days supply | Qty: 180 | Fill #4

## 2018-11-27 MED FILL — ATORVASTATIN 10 MG TABLET: 30 days supply | Qty: 30 | Fill #9 | Status: AC

## 2018-11-27 MED FILL — LISINOPRIL 5 MG TABLET: 30 days supply | Qty: 30 | Fill #2 | Status: AC

## 2018-11-27 MED FILL — LISINOPRIL 5 MG TABLET: ORAL | 30 days supply | Qty: 30 | Fill #2

## 2018-11-27 MED FILL — ATORVASTATIN 10 MG TABLET: ORAL | 30 days supply | Qty: 30 | Fill #9

## 2018-11-27 MED FILL — PROGRAF 1 MG CAPSULE: 30 days supply | Qty: 180 | Fill #4 | Status: AC

## 2018-11-27 MED FILL — MYFORTIC 180 MG TABLET,DELAYED RELEASE: 30 days supply | Qty: 180 | Fill #2 | Status: AC

## 2018-12-03 ENCOUNTER — Ambulatory Visit: Admit: 2018-12-03 | Discharge: 2018-12-04 | Payer: MEDICARE

## 2018-12-03 DIAGNOSIS — Z940 Kidney transplant status: Principal | ICD-10-CM

## 2018-12-03 LAB — RENAL FUNCTION PANEL
ALBUMIN: 4.7 g/dL (ref 3.5–5.0)
ANION GAP: 11 mmol/L (ref 7–15)
BLOOD UREA NITROGEN: 32 mg/dL — ABNORMAL HIGH (ref 7–21)
BUN / CREAT RATIO: 16
CALCIUM: 10 mg/dL (ref 8.5–10.2)
CREATININE: 2.03 mg/dL — ABNORMAL HIGH (ref 0.70–1.30)
EGFR CKD-EPI AA MALE: 39 mL/min/{1.73_m2} — ABNORMAL LOW (ref >=60)
EGFR CKD-EPI NON-AA MALE: 34 mL/min/{1.73_m2} — ABNORMAL LOW (ref >=60)
GLUCOSE RANDOM: 108 mg/dL (ref 70–179)
PHOSPHORUS: 3.6 mg/dL (ref 2.9–4.7)
SODIUM: 142 mmol/L (ref 135–145)

## 2018-12-03 LAB — CBC W/ AUTO DIFF
BASOPHILS ABSOLUTE COUNT: 0 10*9/L (ref 0.0–0.1)
BASOPHILS RELATIVE PERCENT: 0.8 %
EOSINOPHILS ABSOLUTE COUNT: 0.1 10*9/L (ref 0.0–0.4)
EOSINOPHILS RELATIVE PERCENT: 2.5 %
HEMATOCRIT: 43.6 % (ref 41.0–53.0)
HEMOGLOBIN: 14.1 g/dL (ref 13.5–17.5)
LARGE UNSTAINED CELLS: 4 % (ref 0–4)
LYMPHOCYTES ABSOLUTE COUNT: 1 10*9/L — ABNORMAL LOW (ref 1.5–5.0)
LYMPHOCYTES RELATIVE PERCENT: 19.5 %
MEAN CORPUSCULAR HEMOGLOBIN CONC: 32.5 g/dL (ref 31.0–37.0)
MEAN CORPUSCULAR VOLUME: 95.6 fL (ref 80.0–100.0)
MEAN PLATELET VOLUME: 8.2 fL (ref 7.0–10.0)
MONOCYTES ABSOLUTE COUNT: 0.3 10*9/L (ref 0.2–0.8)
NEUTROPHILS ABSOLUTE COUNT: 3.4 10*9/L (ref 2.0–7.5)
NEUTROPHILS RELATIVE PERCENT: 67.2 %
PLATELET COUNT: 196 10*9/L (ref 150–440)
RED BLOOD CELL COUNT: 4.56 10*12/L (ref 4.50–5.90)
RED CELL DISTRIBUTION WIDTH: 14.2 % (ref 12.0–15.0)
WBC ADJUSTED: 5 10*9/L (ref 4.5–11.0)

## 2018-12-03 LAB — HEMATOCRIT: Lab: 43.6

## 2018-12-03 LAB — TACROLIMUS, TROUGH: Lab: 8.4

## 2018-12-03 LAB — MAGNESIUM: Magnesium:MCnc:Pt:Ser/Plas:Qn:: 1.8

## 2018-12-03 LAB — EGFR CKD-EPI AA MALE: Lab: 39 — ABNORMAL LOW

## 2018-12-04 MED ORDER — PROGRAF 1 MG CAPSULE
ORAL_CAPSULE | 11 refills | 0 days | Status: CP
Start: 2018-12-04 — End: 2019-02-25
  Filled 2019-01-22: qty 150, 30d supply, fill #0

## 2018-12-04 NOTE — Unmapped (Signed)
Called patient to review recent labs with pacific interpreter Almira Coaster [phone]    Per Dr. Toni Arthurs patient to decrease tacrolimus to 3/2    Pt verbalized understanding and will change Prograf dose to 3mg  in the morning and 2mg  at night.  Asked patient to get labs in 2 weeks then if labs are good he will transition to once a month labs.  Encouraged him to remain on a low potassium diet    Patient verbalized understanding and denies any other needs at this time

## 2018-12-04 NOTE — Unmapped (Signed)
[facility] Pharmacist Intervention    Type of intervention: dosage change    Medication: prograf 1mg     Problem: [facility] at [facility] for prograf dosage decrease to 3/2.    Intervention: verified per epic encounters that this change was communicated to patient today via coordinator. As patient has other specialty meds already scheduled for a call, will not adjust refill call date at this time    Follow up needed: none at this time    Approximate time spent: 5 minutes    Thad Ranger   [facility] Specialty Pharmacist

## 2018-12-04 NOTE — Unmapped (Signed)
Change in Prograf dosage decrease. Co-pay $0.00. Last filled 11/27/2018 on Part B

## 2018-12-12 NOTE — Unmapped (Signed)
spoke with patient, using interpreter [phone], verbal consent for treatment obtained, pre-registration complete for doximity video, notified of visit charges 6/5 EW (patient also says that he will keep his XRay, lab and ultrasound appts).

## 2018-12-16 ENCOUNTER — Ambulatory Visit: Admit: 2018-12-16 | Discharge: 2018-12-17 | Payer: MEDICARE

## 2018-12-16 DIAGNOSIS — Z940 Kidney transplant status: Principal | ICD-10-CM

## 2018-12-16 LAB — CBC W/ AUTO DIFF
BASOPHILS ABSOLUTE COUNT: 0 10*9/L (ref 0.0–0.1)
BASOPHILS RELATIVE PERCENT: 0.7 %
EOSINOPHILS ABSOLUTE COUNT: 0.1 10*9/L (ref 0.0–0.4)
EOSINOPHILS RELATIVE PERCENT: 1.8 %
LARGE UNSTAINED CELLS: 2 % (ref 0–4)
LYMPHOCYTES ABSOLUTE COUNT: 1 10*9/L — ABNORMAL LOW (ref 1.5–5.0)
LYMPHOCYTES RELATIVE PERCENT: 16 %
MEAN CORPUSCULAR HEMOGLOBIN CONC: 32.2 g/dL (ref 31.0–37.0)
MEAN CORPUSCULAR HEMOGLOBIN: 30.8 pg (ref 26.0–34.0)
MEAN CORPUSCULAR VOLUME: 95.6 fL (ref 80.0–100.0)
MEAN PLATELET VOLUME: 8.1 fL (ref 7.0–10.0)
MONOCYTES ABSOLUTE COUNT: 0.4 10*9/L (ref 0.2–0.8)
MONOCYTES RELATIVE PERCENT: 5.7 %
NEUTROPHILS ABSOLUTE COUNT: 4.8 10*9/L (ref 2.0–7.5)
NEUTROPHILS RELATIVE PERCENT: 73.7 %
PLATELET COUNT: 210 10*9/L (ref 150–440)
RED BLOOD CELL COUNT: 4.71 10*12/L (ref 4.50–5.90)
WBC ADJUSTED: 6.4 10*9/L (ref 4.5–11.0)

## 2018-12-16 LAB — RENAL FUNCTION PANEL
ANION GAP: 10 mmol/L (ref 7–15)
BLOOD UREA NITROGEN: 27 mg/dL — ABNORMAL HIGH (ref 7–21)
BUN / CREAT RATIO: 15
CALCIUM: 10.3 mg/dL — ABNORMAL HIGH (ref 8.5–10.2)
CHLORIDE: 104 mmol/L (ref 98–107)
CO2: 26 mmol/L (ref 22.0–30.0)
CREATININE: 1.83 mg/dL — ABNORMAL HIGH (ref 0.70–1.30)
EGFR CKD-EPI AA MALE: 45 mL/min/{1.73_m2} — ABNORMAL LOW (ref >=60)
EGFR CKD-EPI NON-AA MALE: 39 mL/min/{1.73_m2} — ABNORMAL LOW (ref >=60)
GLUCOSE RANDOM: 113 mg/dL (ref 70–179)
PHOSPHORUS: 3.4 mg/dL (ref 2.9–4.7)
SODIUM: 140 mmol/L (ref 135–145)

## 2018-12-16 LAB — MAGNESIUM: Magnesium:MCnc:Pt:Ser/Plas:Qn:: 1.5 — ABNORMAL LOW

## 2018-12-16 LAB — CREATININE: Creatinine:MCnc:Pt:Ser/Plas:Qn:: 1.83 — ABNORMAL HIGH

## 2018-12-16 LAB — RED BLOOD CELL COUNT: Lab: 4.71

## 2018-12-17 LAB — TACROLIMUS, TROUGH: Lab: 5.3

## 2018-12-24 DIAGNOSIS — Z940 Kidney transplant status: Principal | ICD-10-CM

## 2018-12-24 DIAGNOSIS — I10 Essential (primary) hypertension: Secondary | ICD-10-CM

## 2018-12-24 DIAGNOSIS — D899 Disorder involving the immune mechanism, unspecified: Secondary | ICD-10-CM

## 2018-12-24 NOTE — Unmapped (Signed)
[facility] Refill Coordination Note    Specialty Medication(s) to be Shipped:   Transplant: Myfortic 180mg     Other medication(s) to be shipped: Atorvastatin,Lisinopril     ** PT STATES HAVING STILL 1 MONTH SUPPLY OF HIS PROGRAF[address] [place]: 03-Dec-1955  Phone: [phone] (home)       All above HIPAA information was verified with patient.     Completed refill call assessment today to schedule patient's medication shipment from the [facility] [phone]).       Specialty medication(s) and dose(s) confirmed: Regimen is correct and unchanged.   Changes to medications: Joven reports no changes at this time.  Changes to insurance: No  Questions for the pharmacist: No    Confirmed patient received Welcome Packet with first shipment. The patient will receive a drug information handout for each medication shipped and additional FDA Medication Guides as required.       DISEASE/MEDICATION-SPECIFIC INFORMATION        N/A    SPECIALTY MEDICATION ADHERENCE     Medication Adherence    Patient reported X missed doses in the last month:  0  Specialty Medication:  Myfortic 180mg   Patient is on additional specialty medications:  Yes  Additional Specialty Medications:  Prograf 1mg    Patient Reported Additional Medication X Missed Doses in the Last Month:  0  Patient is on more than two specialty medications:  No  Adherence tools used:  patient uses a pill box to manage medications  Support network for adherence:  family member                Myfortic 180 mg: 5 days of medicine on hand       SHIPPING     Shipping address confirmed in Epic.     Delivery Scheduled: Yes, Expected medication delivery date: 06/19.     Medication will be delivered via UPS to the home address in [place].    Antonietta Barcelona   [facility] Specialty Technician

## 2018-12-25 MED FILL — ATORVASTATIN 10 MG TABLET: ORAL | 30 days supply | Qty: 30 | Fill #10

## 2018-12-25 MED FILL — MYFORTIC 180 MG TABLET,DELAYED RELEASE: ORAL | 30 days supply | Qty: 180 | Fill #3

## 2018-12-25 MED FILL — LISINOPRIL 5 MG TABLET: ORAL | 30 days supply | Qty: 30 | Fill #3

## 2018-12-25 MED FILL — MYFORTIC 180 MG TABLET,DELAYED RELEASE: 30 days supply | Qty: 180 | Fill #3 | Status: AC

## 2018-12-25 MED FILL — LISINOPRIL 5 MG TABLET: 30 days supply | Qty: 30 | Fill #3 | Status: AC

## 2018-12-25 MED FILL — ATORVASTATIN 10 MG TABLET: 30 days supply | Qty: 30 | Fill #10 | Status: AC

## 2019-01-13 NOTE — Unmapped (Signed)
[facility] [place] transplant nephrology telemedicine visit    assessment and plan  1. s/p kidney transplant 09/03/2017. baseline creatinine 1.6-2 mg/dl. no proteinuria. no donor specific hla ab detected.   2. immunosuppression. mycophenolate sodium 540mg  bid. tacrolimus 12hr lvl 5-7 ng/ml.   3. hypertension. blood pressure goal < 130/80 mmhg.  4. hyperlipidemia. atorvastatin 10mg  daily.  5. preventive medicine. influenza '19. ppsv23 pneumococcal '12; 5 year booster recommended. pcv13 pneumococcal '19. zoster '20 with booster recommended. abdomen ct 01/19; annual renal ultrasound to be scheduled. colonoscopy '14 with 5 year follow up recommended.    I spent 12 minutes on the phone with the patient. I spent an additional 15 minutes on pre- and post-visit activities.     The patient was physically located in [place] or a state in which I am permitted to provide care. The patient and/or parent/guardian understood that s/he may incur co-pays and cost sharing, and agreed to the telemedicine visit. The visit was reasonable and appropriate under the circumstances given the patient's presentation at the time.    The patient and/or parent/guardian has been advised of the potential risks and limitations of this mode of treatment (including, but not limited to, the absence of in-person examination) and has agreed to be treated using telemedicine. The patient's/patient's family's questions regarding telemedicine have been answered.     If the visit was completed in an ambulatory setting, the patient and/or parent/guardian has also been advised to contact their provider???s office for worsening conditions, and seek emergency medical treatment and/or call 911 if the patient deems either necessary.    history of present illness    Justin Mcknight is a 63 year old gentleman who is s/p kidney transplant 09/03/2017. he was contacted with assistance of pacific interpreters/spanish. he feels well. no fevers chills or sweats. no headache. no dizziness or lightheaded. no chest pain cough or shortness of breath. no lower extremity edema. appetite nl. no abdominal pain no n/v/d. no myalgias or arthralgias. no dysuria hematuria or difficulty voiding. blood pressure 120-130s/80s. all other systems reviewed and negative x10 systems.    past medical hx:  1. 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 hypertension. alemtuzumab induction. baseline creatinine 1.6-2 mg/dl.  > kidney bx 03/19: no acute rejection. +mild focal thrombotic microangiopathy.  > kidney bx 05/19: no acute rejection. no thrombotic microangiopathy.  2. hypertension  3. hx renal cell carcinoma s/p right nephrectomy '12.    past surgical hx: left forearm av fistula '12. right nephrectomy '12. kidney transplant '19. left forearm av fistula ligation '19.    allergies: codeine    medications: tacrolimus 3mg /2mg  am/pm, mycophenolate sodium 540mg  bid, lisinopril 5mg  q.pm, atorvastatin 10mg  daily.    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    labs 12/16/18: wbc6.4 hgb14.5 hct45 plts210. na140 k5.2 cl104 bicarb26 bun27 cr1.8 glc113 ca10.3 mg1.5 phos3.4 albumin4.7. tacrolimus lvl 5.3 ng/ml. urinalysis no protein or blood.

## 2019-01-15 NOTE — Unmapped (Signed)
[facility] Specialty Pharmacy Refill Coordination Note    Specialty Medication(s) to be Shipped:   Transplant: Myfortic 180mg  and Prograf 1mg   Other medication(s) to be shipped: Atorvastatin 10mg  & Lisinopril 5mg      [facility], DOB: [birth year]  Phone: [phone] (home)     All above HIPAA information was verified with patient.     Completed refill call assessment today to schedule patient's medication shipment from the [facility] [phone]).       Specialty medication(s) and dose(s) confirmed: Regimen is correct and unchanged.   Changes to medications: Justin Mcknight reports no changes reported at this time.  Changes to insurance: No  Questions for the pharmacist: No    Confirmed patient received Welcome Packet with first shipment. The patient will receive a drug information handout for each medication shipped and additional FDA Medication Guides as required.       DISEASE/MEDICATION-SPECIFIC INFORMATION        N/A    SPECIALTY MEDICATION ADHERENCE     Medication Adherence    Patient reported X missed doses in the last month:  0  Specialty Medication:  Myfortic 180mg   Patient is on additional specialty medications:  Yes  Additional Specialty Medications:  Prograf 1mg    Patient Reported Additional Medication X Missed Doses in the Last Month:  0  Patient is on more than two specialty medications:  No  Adherence tools used:  patient uses a pill box to manage medications  Support network for adherence:  family member        Myfortic 180 mg: 10 days of medicine on hand   Prograf 1 mg: 10 days of medicine on hand     SHIPPING     Shipping address confirmed in Epic.     Delivery Scheduled: Yes, Expected medication delivery date: 01/23/2019.     Medication will be delivered via UPS to the home address in [place].    Justin Mcknight Justin Mcknight   [facility] Specialty Technician

## 2019-01-22 MED FILL — ATORVASTATIN 10 MG TABLET: ORAL | 30 days supply | Qty: 30 | Fill #11

## 2019-01-22 MED FILL — MYFORTIC 180 MG TABLET,DELAYED RELEASE: 30 days supply | Qty: 180 | Fill #4 | Status: AC

## 2019-01-22 MED FILL — MYFORTIC 180 MG TABLET,DELAYED RELEASE: ORAL | 30 days supply | Qty: 180 | Fill #4

## 2019-01-22 MED FILL — ATORVASTATIN 10 MG TABLET: 30 days supply | Qty: 30 | Fill #11 | Status: AC

## 2019-01-22 MED FILL — PROGRAF 1 MG CAPSULE: 30 days supply | Qty: 150 | Fill #0 | Status: AC

## 2019-01-22 MED FILL — LISINOPRIL 5 MG TABLET: 30 days supply | Qty: 30 | Fill #4 | Status: AC

## 2019-01-22 MED FILL — LISINOPRIL 5 MG TABLET: ORAL | 30 days supply | Qty: 30 | Fill #4

## 2019-02-10 ENCOUNTER — Ambulatory Visit: Admit: 2019-02-10 | Discharge: 2019-02-11 | Payer: MEDICARE

## 2019-02-10 DIAGNOSIS — Z940 Kidney transplant status: Principal | ICD-10-CM

## 2019-02-10 LAB — RENAL FUNCTION PANEL
ALBUMIN: 4.6 g/dL (ref 3.5–5.0)
ANION GAP: 8 mmol/L (ref 7–15)
BLOOD UREA NITROGEN: 35 mg/dL — ABNORMAL HIGH (ref 7–21)
BUN / CREAT RATIO: 19
CALCIUM: 9.9 mg/dL (ref 8.5–10.2)
CHLORIDE: 107 mmol/L (ref 98–107)
CO2: 24 mmol/L (ref 22.0–30.0)
CREATININE: 1.81 mg/dL — ABNORMAL HIGH (ref 0.70–1.30)
EGFR CKD-EPI AA MALE: 45 mL/min/{1.73_m2} — ABNORMAL LOW (ref >=60)
EGFR CKD-EPI NON-AA MALE: 39 mL/min/{1.73_m2} — ABNORMAL LOW (ref >=60)
GLUCOSE RANDOM: 122 mg/dL (ref 70–179)
PHOSPHORUS: 2.7 mg/dL — ABNORMAL LOW (ref 2.9–4.7)
POTASSIUM: 4.9 mmol/L (ref 3.5–5.0)
SODIUM: 139 mmol/L (ref 135–145)

## 2019-02-10 LAB — CBC W/ AUTO DIFF
BASOPHILS ABSOLUTE COUNT: 0.1 10*9/L (ref 0.0–0.1)
EOSINOPHILS ABSOLUTE COUNT: 0.1 10*9/L (ref 0.0–0.4)
EOSINOPHILS RELATIVE PERCENT: 1.7 %
HEMATOCRIT: 44.8 % (ref 41.0–53.0)
HEMOGLOBIN: 14.1 g/dL (ref 13.5–17.5)
LARGE UNSTAINED CELLS: 2 % (ref 0–4)
LYMPHOCYTES ABSOLUTE COUNT: 1 10*9/L — ABNORMAL LOW (ref 1.5–5.0)
LYMPHOCYTES RELATIVE PERCENT: 14 %
MEAN CORPUSCULAR HEMOGLOBIN CONC: 31.5 g/dL (ref 31.0–37.0)
MEAN CORPUSCULAR HEMOGLOBIN: 30.5 pg (ref 26.0–34.0)
MEAN CORPUSCULAR VOLUME: 96.7 fL (ref 80.0–100.0)
MEAN PLATELET VOLUME: 8.5 fL (ref 7.0–10.0)
MONOCYTES ABSOLUTE COUNT: 0.4 10*9/L (ref 0.2–0.8)
MONOCYTES RELATIVE PERCENT: 5.4 %
NEUTROPHILS ABSOLUTE COUNT: 5.2 10*9/L (ref 2.0–7.5)
PLATELET COUNT: 202 10*9/L (ref 150–440)
RED BLOOD CELL COUNT: 4.63 10*12/L (ref 4.50–5.90)
RED CELL DISTRIBUTION WIDTH: 14.4 % (ref 12.0–15.0)
WBC ADJUSTED: 6.8 10*9/L (ref 4.5–11.0)

## 2019-02-10 LAB — MAGNESIUM: Magnesium:MCnc:Pt:Ser/Plas:Qn:: 1.6

## 2019-02-10 LAB — MEAN CORPUSCULAR VOLUME: Lab: 96.7

## 2019-02-10 LAB — GLUCOSE RANDOM: Glucose:MCnc:Pt:Ser/Plas:Qn:: 122

## 2019-02-10 LAB — TACROLIMUS, TROUGH: Lab: 8.2

## 2019-02-25 MED ORDER — TACROLIMUS 1 MG CAPSULE
ORAL_CAPSULE | ORAL | 11 refills | 0.00000 days | Status: CP
Start: 2019-02-25 — End: 2019-02-25
  Filled 2019-02-26: qty 150, 30d supply, fill #0

## 2019-02-25 MED ORDER — ATORVASTATIN 10 MG TABLET
ORAL_TABLET | Freq: Every day | ORAL | 11 refills | 30.00000 days | Status: CP
Start: 2019-02-25 — End: 2019-02-25
  Filled 2019-02-26: qty 30, 30d supply, fill #0

## 2019-02-25 MED ORDER — TACROLIMUS 1 MG CAPSULE: capsule | 11 refills | 0 days | Status: AC

## 2019-02-25 MED ORDER — ATORVASTATIN 10 MG TABLET: 10 mg | tablet | Freq: Every day | 11 refills | 30 days | Status: AC

## 2019-02-25 NOTE — Unmapped (Signed)
Spoke to [name] about changing from Prograf to generic Tacrolimus.  Told him to give the clinic a call when he starts the new mfg to schedule labs.  Pt acknowledged understanding.       [facility] Clinical Assessment & Refill Coordination Note    Justin Mcknight [place], [place]: 18-Aug-1955  Phone: [phone] (home)     All above HIPAA information was verified with patient. Spoke with patient with [employer] today about his medicinel    Specialty Medication(s):   Transplant: Myfortic 180mg  and Prograf 1mg      Current Outpatient Medications   Medication Sig Dispense Refill   ??? atorvastatin (LIPITOR) 10 MG tablet Take 1 tablet (10 mg total) by mouth daily. 30 tablet 11   ??? lisinopriL (PRINIVIL,ZESTRIL) 5 MG tablet Take 1 tablet (5 mg total) by mouth every evening. 30 tablet 11   ??? MYFORTIC 180 mg EC tablet Take 3 tablets (540mg ) by mouth 2 times daily 180 tablet 11   ??? PROGRAF 1 mg capsule Take 3 capsules (3mg ) in the morning and 2 capsules (2mg ) at night 150 capsule 11     No current facility-administered medications for this visit.         Changes to medications: Teller reports no changes at this time.    Allergies   Allergen Reactions   ??? Codeine Swelling       Changes to allergies: No    SPECIALTY MEDICATION ADHERENCE     Prograf 1 mg: 4 days of medicine on hand   Myfortic 180 mg: 4 days of medicine on hand     Medication Adherence    Patient reported X missed doses in the last month: 0  Specialty Medication: Myfortic 180mg   Patient is on additional specialty medications: Yes  Additional Specialty Medications: Prograf   Adherence tools used: patient uses a pill box to manage medications  Support network for adherence: family member          Specialty medication(s) dose(s) confirmed: Regimen is correct and unchanged.     Are there any concerns with adherence? No    Adherence counseling provided? Not needed    CLINICAL MANAGEMENT AND INTERVENTION      Clinical Benefit Assessment:    Do you feel the medicine is effective or helping your condition? Yes    Clinical Benefit counseling provided? Not needed    Adverse Effects Assessment:    Are you experiencing any side effects? No    Are you experiencing difficulty administering your medicine? No    Quality of Life Assessment:    How many days over the past month did your kidney transplant  keep you from your normal activities? For example, brushing your teeth or getting up in the morning. 0    Have you discussed this with your provider? Not needed    Therapy Appropriateness:    Is therapy appropriate? Yes, therapy is appropriate and should be continued    DISEASE/MEDICATION-SPECIFIC INFORMATION      N/A    PATIENT SPECIFIC NEEDS     ? Does the patient have any physical, cognitive, or cultural barriers? No    ? Is the patient high risk? Yes, patient taking a REMS drug     ? Does the patient require a Care Management Plan? No     ? Does the patient require physician intervention or other additional services (i.e. nutrition, smoking cessation, social work)? No      SHIPPING  Specialty Medication(s) to be Shipped:   Transplant: Myfortic 180mg  and tacrolimus 1mg     Other medication(s) to be shipped: atorvastatin, lisinopril     Changes to insurance: No    Delivery Scheduled: Yes, Expected medication delivery date: 02/27/2019.     Medication will be delivered via UPS to the confirmed home address in [facility].    The patient will receive a drug information handout for each medication shipped and additional FDA Medication Guides as required.  Verified that patient has previously received a Conservation officer, historic buildings.    All of the patient's questions and concerns have been addressed.    Tera Helper   [facility] Specialty Pharmacist

## 2019-02-26 MED FILL — MYFORTIC 180 MG TABLET,DELAYED RELEASE: ORAL | 30 days supply | Qty: 180 | Fill #5

## 2019-02-26 MED FILL — ATORVASTATIN 10 MG TABLET: 30 days supply | Qty: 30 | Fill #0 | Status: AC

## 2019-02-26 MED FILL — LISINOPRIL 5 MG TABLET: ORAL | 30 days supply | Qty: 30 | Fill #5

## 2019-02-26 MED FILL — TACROLIMUS 1 MG CAPSULE: 30 days supply | Qty: 150 | Fill #0 | Status: AC

## 2019-02-26 MED FILL — LISINOPRIL 5 MG TABLET: 30 days supply | Qty: 30 | Fill #5 | Status: AC

## 2019-02-26 MED FILL — MYFORTIC 180 MG TABLET,DELAYED RELEASE: 30 days supply | Qty: 180 | Fill #5 | Status: AC

## 2019-03-12 DIAGNOSIS — Z114 Encounter for screening for human immunodeficiency virus [HIV]: Secondary | ICD-10-CM

## 2019-03-12 DIAGNOSIS — Z940 Kidney transplant status: Secondary | ICD-10-CM

## 2019-03-12 DIAGNOSIS — Z1159 Encounter for screening for other viral diseases: Secondary | ICD-10-CM

## 2019-03-12 DIAGNOSIS — Z0000 Encounter for general adult medical examination without abnormal findings: Secondary | ICD-10-CM

## 2019-03-12 NOTE — Unmapped (Signed)
Called and spoke to patient with [employer] interpreter [phone] to verify he get labs before his upcoming apt.    patient will go to [facility] for labs in the next week or so.    Reports he has been doing well and denies any complaints

## 2019-03-24 ENCOUNTER — Ambulatory Visit: Admit: 2019-03-24 | Discharge: 2019-03-25 | Payer: MEDICARE

## 2019-03-24 DIAGNOSIS — Z0000 Encounter for general adult medical examination without abnormal findings: Secondary | ICD-10-CM

## 2019-03-24 DIAGNOSIS — Z940 Kidney transplant status: Secondary | ICD-10-CM

## 2019-03-24 DIAGNOSIS — Z1159 Encounter for screening for other viral diseases: Secondary | ICD-10-CM

## 2019-03-24 DIAGNOSIS — Z114 Encounter for screening for human immunodeficiency virus [HIV]: Secondary | ICD-10-CM

## 2019-03-24 LAB — BASIC METABOLIC PANEL
ANION GAP: 11 mmol/L (ref 7–15)
BLOOD UREA NITROGEN: 36 mg/dL — ABNORMAL HIGH (ref 7–21)
BUN / CREAT RATIO: 19
CALCIUM: 9.9 mg/dL (ref 8.5–10.2)
CHLORIDE: 106 mmol/L (ref 98–107)
CO2: 25 mmol/L (ref 22.0–30.0)
EGFR CKD-EPI AA MALE: 44 mL/min/{1.73_m2} — ABNORMAL LOW (ref >=60)
EGFR CKD-EPI NON-AA MALE: 38 mL/min/{1.73_m2} — ABNORMAL LOW (ref >=60)
GLUCOSE RANDOM: 121 mg/dL — ABNORMAL HIGH (ref 70–99)
POTASSIUM: 5.6 mmol/L — ABNORMAL HIGH (ref 3.5–5.0)
SODIUM: 142 mmol/L (ref 135–145)

## 2019-03-24 LAB — URINALYSIS
BILIRUBIN UA: NEGATIVE
GLUCOSE UA: NEGATIVE
KETONES UA: NEGATIVE
LEUKOCYTE ESTERASE UA: NEGATIVE
NITRITE UA: NEGATIVE
PH UA: 5 (ref 5.0–9.0)
PROTEIN UA: NEGATIVE
RBC UA: 1 /HPF (ref <3)
SPECIFIC GRAVITY UA: 1.015 (ref 1.005–1.040)
SQUAMOUS EPITHELIAL: 1 /HPF (ref 0–5)
UROBILINOGEN UA: 0.2
WBC UA: 0 /HPF (ref <2)

## 2019-03-24 LAB — LARGE UNSTAINED CELLS: Lab: 2

## 2019-03-24 LAB — CBC W/ AUTO DIFF
BASOPHILS ABSOLUTE COUNT: 0 10*9/L (ref 0.0–0.1)
BASOPHILS RELATIVE PERCENT: 0.6 %
EOSINOPHILS RELATIVE PERCENT: 2.4 %
HEMATOCRIT: 42.2 % (ref 41.0–53.0)
HEMOGLOBIN: 13.9 g/dL (ref 13.5–17.5)
LARGE UNSTAINED CELLS: 2 % (ref 0–4)
LYMPHOCYTES ABSOLUTE COUNT: 0.9 10*9/L — ABNORMAL LOW (ref 1.5–5.0)
MEAN CORPUSCULAR HEMOGLOBIN CONC: 32.9 g/dL (ref 31.0–37.0)
MEAN CORPUSCULAR HEMOGLOBIN: 30.8 pg (ref 26.0–34.0)
MEAN CORPUSCULAR VOLUME: 93.7 fL (ref 80.0–100.0)
MEAN PLATELET VOLUME: 8.1 fL (ref 7.0–10.0)
MONOCYTES ABSOLUTE COUNT: 0.3 10*9/L (ref 0.2–0.8)
MONOCYTES RELATIVE PERCENT: 5.6 %
NEUTROPHILS ABSOLUTE COUNT: 4.5 10*9/L (ref 2.0–7.5)
NEUTROPHILS RELATIVE PERCENT: 74.9 %
PLATELET COUNT: 231 10*9/L (ref 150–440)
RED BLOOD CELL COUNT: 4.5 10*12/L (ref 4.50–5.90)
RED CELL DISTRIBUTION WIDTH: 15 % (ref 12.0–15.0)
WBC ADJUSTED: 6.1 10*9/L (ref 4.5–11.0)

## 2019-03-24 LAB — HEPATITIS C ANTIBODY: Hepatitis C virus Ab:PrThr:Pt:Ser:Ord:: NONREACTIVE

## 2019-03-24 LAB — HEPATITIS B CORE TOTAL ANTIBODY: Hepatitis B virus core Ab:PrThr:Pt:Ser/Plas:Ord:IA: NONREACTIVE

## 2019-03-24 LAB — TACROLIMUS, TROUGH: Lab: 9.5

## 2019-03-24 LAB — HEMOGLOBIN A1C
HEMOGLOBIN A1C: 6.7 % — ABNORMAL HIGH (ref 4.8–5.6)
Hemoglobin A1c/Hemoglobin.total:MFr:Pt:Bld:Qn:: 6.7 — ABNORMAL HIGH

## 2019-03-24 LAB — VLDL CHOLESTEROL CAL: Cholesterol.in VLDL:MCnc:Pt:Ser/Plas:Qn:Calculated: 11.8

## 2019-03-24 LAB — MAGNESIUM: Magnesium:MCnc:Pt:Ser/Plas:Qn:: 1.6

## 2019-03-24 LAB — LIPID PANEL
CHOLESTEROL/HDL RATIO SCREEN: 2.1
CHOLESTEROL: 146 mg/dL (ref 100–199)
HDL CHOLESTEROL: 69 mg/dL — ABNORMAL HIGH (ref 40–59)
NON-HDL CHOLESTEROL: 77 mg/dL
TRIGLYCERIDES: 59 mg/dL (ref 1–149)

## 2019-03-24 LAB — PROTEIN / CREATININE RATIO, URINE: CREATININE, URINE: 79.8 mg/dL

## 2019-03-24 LAB — SPECIFIC GRAVITY UA: Specific gravity:Rden:Pt:Urine:Qn:: 1.015

## 2019-03-24 LAB — PARATHYROID HOMONE (PTH): PARATHYROID HORMONE INTACT: 116.5 pg/mL — ABNORMAL HIGH (ref 12.0–72.0)

## 2019-03-24 LAB — HEPATIC FUNCTION PANEL
ALBUMIN: 4.5 g/dL (ref 3.5–5.0)
AST (SGOT): 26 U/L (ref 19–55)
BILIRUBIN TOTAL: 0.6 mg/dL (ref 0.0–1.2)
PROTEIN TOTAL: 7.1 g/dL (ref 6.5–8.3)

## 2019-03-24 LAB — BUN / CREAT RATIO: Urea nitrogen/Creatinine:MRto:Pt:Ser/Plas:Qn:: 19

## 2019-03-24 LAB — HEPATITIS B SURFACE ANTIGEN: Hepatitis B virus surface Ag:PrThr:Pt:Ser:Ord:: NONREACTIVE

## 2019-03-24 LAB — PHOSPHORUS: Phosphate:MCnc:Pt:Ser/Plas:Qn:: 2.9

## 2019-03-24 LAB — BILIRUBIN DIRECT: Bilirubin.glucuronidated+Bilirubin.albumin bound:MCnc:Pt:Ser/Plas:Qn:: 0.2

## 2019-03-24 LAB — CALCIUM: Calcium:MCnc:Pt:Ser/Plas:Qn:: 9.8

## 2019-03-24 LAB — HIV ANTIGEN/ANTIBODY COMBO: HIV 1+2 Ab+HIV1 p24 Ag:PrThr:Pt:Ser/Plas:Ord:IA: NONREACTIVE

## 2019-03-24 LAB — PROTEIN URINE: Protein:MCnc:Pt:Urine:Qn:: 15.9

## 2019-03-24 MED ORDER — TACROLIMUS 1 MG CAPSULE
ORAL_CAPSULE | Freq: Two times a day (BID) | ORAL | 11 refills | 30.00000 days | Status: CP
Start: 2019-03-24 — End: ?
  Filled 2019-03-31: qty 120, 30d supply, fill #0

## 2019-03-24 NOTE — Unmapped (Signed)
Per Dr Toni Arthurs decrease prograf to 2mg  BID.    Called patient with pacific interpreter [phone] and went over labs with patient and need to decrease dose.  Patient verbalized understanding to decrease prograf dose to 2mg  BID    Denies any other needs at this time

## 2019-03-25 LAB — EBV VIRAL LOAD RESULT: Lab: NOT DETECTED

## 2019-03-25 LAB — CMV DNA, QUANTITATIVE, PCR: CMV VIRAL LD: NOT DETECTED

## 2019-03-25 LAB — CMV QUANT: Lab: 0

## 2019-03-25 LAB — VITAMIN D 25 HYDROXY: VITAMIN D, TOTAL (25OH): 28.1 ng/mL (ref 20.0–80.0)

## 2019-03-25 LAB — VITAMIN D, TOTAL (25OH): Lab: 28.1

## 2019-03-25 NOTE — Unmapped (Signed)
Dose change on prograf to 2mg  BID. Patient aware per coordinator note 03/24/19.  Will adjust call accordingly.

## 2019-03-25 NOTE — Unmapped (Signed)
[facility] Specialty Medication Referral: No PA required    Medication (Brand/Generic): TACROLIMUS 1MG  (DOSE CHANGE)    Initial Benefits Investigation Claim completed with resulted information below:  No PA required  Patient ABLE to fill at [facility]  Insurance Company:  MEDICARE PART B  Anticipated Copay: $0    As Co-pay is under $25 defined limit, per policy there will be no further investigation of need for financial assistance at this time unless patient requests. This referral has been communicated to the provider and handed off to the [facility] team for further processing and filling of prescribed medication.   ______________________________________________________________________  Please utilize this referral for viewing purposes as it will serve as the central location for all relevant documentation and updates.

## 2019-03-26 LAB — HLA DS POST TRANSPLANT
ANTI-DONOR DRW #1 MFI: 130 MFI
ANTI-DONOR DRW #2 MFI: 107 MFI
ANTI-DONOR HLA-A #1 MFI: 200 MFI
ANTI-DONOR HLA-A #2 MFI: 21 MFI
ANTI-DONOR HLA-B #1 MFI: 204 MFI
ANTI-DONOR HLA-B #2 MFI: 238 MFI
ANTI-DONOR HLA-C #1 MFI: 200 MFI
ANTI-DONOR HLA-C #2 MFI: 738 MFI
ANTI-DONOR HLA-DQB #1 MFI: 78 MFI
ANTI-DONOR HLA-DQB #2 MFI: 162 MFI
ANTI-DONOR HLA-DR #1 MFI: 85 MFI
ANTI-DONOR HLA-DR #2 MFI: 95 MFI

## 2019-03-26 LAB — BK BLOOD RESULT: Lab: NOT DETECTED

## 2019-03-26 LAB — FSAB CLASS 1 ANTIBODY SPECIFICITY

## 2019-03-26 LAB — ANTI-DONOR HLA-DP AG #1 MFI: Lab: 80

## 2019-03-26 LAB — CLASS 2 ANTIBODIES IDENTIFIED

## 2019-03-26 LAB — HLA CL1 ANTIBODY COMM: Lab: 0

## 2019-03-26 LAB — HBV DNA COMMENT: Lab: 0

## 2019-03-26 LAB — HEPATITIS B DNA, ULTRAQUANTITATIVE, PCR

## 2019-03-26 LAB — BK VIRUS QUANTITATIVE PCR, BLOOD

## 2019-03-26 NOTE — Unmapped (Signed)
[facility] Special[facility] Pharm[facility] Refill Coordination Note    Specialty Medication(s) to be Shipped:   Transplant: Myfortic 180mg  and tacrolimus 1mg     Other medication(s) to be shipped:   Lisinopril  Atorvastatin      [facility], [place]: [birth year]  Phone: [phone] (home)       All above HIPAA information was verified with patient.     Completed refill call assessment today to schedule patient's medication shipment from the [facility] [phone]).       Specialty medication(s) and dose(s) confirmed: Regimen is correct and unchanged.   Changes to medications: [name] reports no changes at this time.  Changes to insurance: No  Questions for the pharmacist: No    Confirmed patient received Welcome Packet with first shipment. The patient will receive a drug information handout for each medication shipped and additional FDA Medication Guides as required.       DISEASE/MEDICATION-SPECIFIC INFORMATION        N/A    SPECIALTY MEDICATION ADHERENCE     Medication Adherence    Patient reported X missed doses in the last month: 0  Adherence tools used: patient uses a pill box to manage medications  Support network for adherence: family member                   Myfortic 180 mg: 4 days of medicine on hand   Tacrolimus 1 mg: 4 days of medicine on hand         SHIPPING     Shipping address confirmed in Epic.     Delivery Scheduled: Yes, Expected medication delivery date: 03/31/19.     Medication will be delivered via Same Day Courier to the home address in [place].            Justin Mcknight   [facility] Specialty Technician

## 2019-03-30 LAB — VITAMIN D 1,25-DIHYDROXY: 1,25-Dihydroxyvitamin D:MCnc:Pt:Ser/Plas:Qn:: 52

## 2019-03-31 MED FILL — MYFORTIC 180 MG TABLET,DELAYED RELEASE: ORAL | 30 days supply | Qty: 180 | Fill #6

## 2019-03-31 MED FILL — MYFORTIC 180 MG TABLET,DELAYED RELEASE: 30 days supply | Qty: 180 | Fill #6 | Status: AC

## 2019-03-31 MED FILL — ATORVASTATIN 10 MG TABLET: ORAL | 30 days supply | Qty: 30 | Fill #1

## 2019-03-31 MED FILL — LISINOPRIL 5 MG TABLET: 30 days supply | Qty: 30 | Fill #6 | Status: AC

## 2019-03-31 MED FILL — LISINOPRIL 5 MG TABLET: ORAL | 30 days supply | Qty: 30 | Fill #6

## 2019-03-31 MED FILL — TACROLIMUS 1 MG CAPSULE: 30 days supply | Qty: 120 | Fill #0 | Status: AC

## 2019-03-31 MED FILL — ATORVASTATIN 10 MG TABLET: 30 days supply | Qty: 30 | Fill #1 | Status: AC

## 2019-04-21 ENCOUNTER — Ambulatory Visit: Admit: 2019-04-21 | Discharge: 2019-04-22 | Payer: MEDICARE

## 2019-04-21 DIAGNOSIS — Z0000 Encounter for general adult medical examination without abnormal findings: Principal | ICD-10-CM

## 2019-04-21 DIAGNOSIS — Z940 Kidney transplant status: Principal | ICD-10-CM

## 2019-04-21 LAB — CBC W/ AUTO DIFF
BASOPHILS ABSOLUTE COUNT: 0.1 10*9/L (ref 0.0–0.1)
BASOPHILS RELATIVE PERCENT: 0.8 %
EOSINOPHILS ABSOLUTE COUNT: 0.1 10*9/L (ref 0.0–0.4)
EOSINOPHILS RELATIVE PERCENT: 1.9 %
HEMATOCRIT: 42.7 % (ref 41.0–53.0)
HEMOGLOBIN: 13.7 g/dL (ref 13.5–17.5)
LARGE UNSTAINED CELLS: 2 % (ref 0–4)
LYMPHOCYTES ABSOLUTE COUNT: 0.9 10*9/L — ABNORMAL LOW (ref 1.5–5.0)
LYMPHOCYTES RELATIVE PERCENT: 14 %
MEAN CORPUSCULAR HEMOGLOBIN CONC: 32.1 g/dL (ref 31.0–37.0)
MEAN CORPUSCULAR HEMOGLOBIN: 30.1 pg (ref 26.0–34.0)
MEAN PLATELET VOLUME: 8.2 fL (ref 7.0–10.0)
MONOCYTES ABSOLUTE COUNT: 0.4 10*9/L (ref 0.2–0.8)
NEUTROPHILS ABSOLUTE COUNT: 4.7 10*9/L (ref 2.0–7.5)
NEUTROPHILS RELATIVE PERCENT: 74.4 %
PLATELET COUNT: 229 10*9/L (ref 150–440)
RED BLOOD CELL COUNT: 4.55 10*12/L (ref 4.50–5.90)
RED CELL DISTRIBUTION WIDTH: 14.6 % (ref 12.0–15.0)
WBC ADJUSTED: 6.3 10*9/L (ref 4.5–11.0)

## 2019-04-21 LAB — CHLORIDE: Chloride:SCnc:Pt:Ser/Plas:Qn:: 108 — ABNORMAL HIGH

## 2019-04-21 LAB — BASIC METABOLIC PANEL
ANION GAP: 11 mmol/L (ref 7–15)
BLOOD UREA NITROGEN: 33 mg/dL — ABNORMAL HIGH (ref 7–21)
BUN / CREAT RATIO: 19
CALCIUM: 9.9 mg/dL (ref 8.5–10.2)
CO2: 24 mmol/L (ref 22.0–30.0)
EGFR CKD-EPI AA MALE: 48 mL/min/{1.73_m2} — ABNORMAL LOW (ref >=60)
EGFR CKD-EPI NON-AA MALE: 41 mL/min/{1.73_m2} — ABNORMAL LOW (ref >=60)
GLUCOSE RANDOM: 122 mg/dL (ref 70–179)
POTASSIUM: 5.4 mmol/L — ABNORMAL HIGH (ref 3.5–5.0)
SODIUM: 143 mmol/L (ref 135–145)

## 2019-04-21 LAB — URINALYSIS
BILIRUBIN UA: NEGATIVE
GLUCOSE UA: NEGATIVE
KETONES UA: NEGATIVE
LEUKOCYTE ESTERASE UA: NEGATIVE
PH UA: 5.5 (ref 5.0–9.0)
RBC UA: 1 /HPF (ref <3)
SPECIFIC GRAVITY UA: 1.02 (ref 1.005–1.040)
SQUAMOUS EPITHELIAL: 0 /HPF (ref 0–5)
UROBILINOGEN UA: 0.2
WBC UA: <1 /HPF (ref <2)

## 2019-04-21 LAB — TACROLIMUS, TROUGH: Lab: 6.6

## 2019-04-21 LAB — PROTEIN / CREATININE RATIO, URINE
CREATININE, URINE: 72.7 mg/dL
PROTEIN URINE: 98 mg/dL

## 2019-04-21 LAB — PHOSPHORUS: Phosphate:MCnc:Pt:Ser/Plas:Qn:: 3.1

## 2019-04-21 LAB — WBC ADJUSTED: Leukocytes:NCnc:Pt:Bld:Qn:: 6.3

## 2019-04-21 LAB — WBC UA: Leukocytes:Naric:Pt:Urine sed:Qn:Microscopy.light.HPF: <1

## 2019-04-21 LAB — PROTEIN/CREAT RATIO, URINE: Protein/Creatinine:MRto:Pt:Urine:Qn:: 1.348

## 2019-04-21 LAB — MAGNESIUM: Magnesium:MCnc:Pt:Ser/Plas:Qn:: 1.6

## 2019-04-21 NOTE — Unmapped (Signed)
[facility] Special[facility] Pharm[facility] Refill Coordination Note    Specialty Medication(s) to be Shipped:   Transplant: Myfortic 180mg  and tacrolimus 1mg     Other medication(s) to be shipped: Atorvastatin 10mg  and lisinopril 5mg      [facility], DOB: 1956-04-09  Phone: [phone] (home)       All above HIPAA information was verified with patient.     Completed refill call assessment today to schedule patient's medication shipment from the [facility] [phone]).       Specialty medication(s) and dose(s) confirmed: Regimen is correct and unchanged.   Changes to medications: Justin Mcknight reports no changes at this time.  Changes to insurance: No  Questions for the pharmacist: No    Confirmed patient received Welcome Packet with first shipment. The patient will receive a drug information handout for each medication shipped and additional FDA Medication Guides as required.       DISEASE/MEDICATION-SPECIFIC INFORMATION        N/A    SPECIALTY MEDICATION ADHERENCE     Medication Adherence    Patient reported X missed doses in the last month: 0  Specialty Medication: Tacrolimus 1mg   Patient is on additional specialty medications: Yes  Additional Specialty Medications: Myfortic 180mg   Patient Reported Additional Medication X Missed Doses in the Last Month: 0  Patient is on more than two specialty medications: No  Adherence tools used: patient uses a pill box to manage medications  Support network for adherence: family member          Tacrolimus 1 mg: 7 days of medicine on hand   Myfortic 180 mg: 7 days of medicine on hand     SHIPPING     Shipping address confirmed in Epic.     Delivery Scheduled: Yes, Expected medication delivery date: 04/28/2019.     Medication will be delivered via UPS to the home address in [place].    Justin Mcknight   [facility] Specialty Technician

## 2019-04-24 ENCOUNTER — Institutional Professional Consult (permissible substitution): Admit: 2019-04-24 | Discharge: 2019-04-25 | Payer: MEDICARE | Attending: Nephrology | Primary: Nephrology

## 2019-04-27 DIAGNOSIS — Z940 Kidney transplant status: Principal | ICD-10-CM

## 2019-04-27 DIAGNOSIS — Z0000 Encounter for general adult medical examination without abnormal findings: Principal | ICD-10-CM

## 2019-04-27 MED FILL — MYFORTIC 180 MG TABLET,DELAYED RELEASE: 30 days supply | Qty: 180 | Fill #7 | Status: AC

## 2019-04-27 MED FILL — LISINOPRIL 5 MG TABLET: ORAL | 30 days supply | Qty: 30 | Fill #7

## 2019-04-27 MED FILL — LISINOPRIL 5 MG TABLET: 30 days supply | Qty: 30 | Fill #7 | Status: AC

## 2019-04-27 MED FILL — TACROLIMUS 1 MG CAPSULE: 30 days supply | Qty: 120 | Fill #1 | Status: AC

## 2019-04-27 MED FILL — ATORVASTATIN 10 MG TABLET: 30 days supply | Qty: 30 | Fill #2 | Status: AC

## 2019-04-27 MED FILL — ATORVASTATIN 10 MG TABLET: ORAL | 30 days supply | Qty: 30 | Fill #2

## 2019-04-27 MED FILL — TACROLIMUS 1 MG CAPSULE, IMMEDIATE-RELEASE: ORAL | 30 days supply | Qty: 120 | Fill #1

## 2019-04-27 MED FILL — MYFORTIC 180 MG TABLET,DELAYED RELEASE: ORAL | 30 days supply | Qty: 180 | Fill #7

## 2019-04-27 NOTE — Unmapped (Signed)
[facility] [place] transplant nephrology telemedicine visit    assessment and plan  1. s/p kidney transplant 09/03/2017. baseline creatinine 1.6-2 mg/dl. no proteinuria. no donor specific hla ab detected.   2. immunosuppression. mycophenolate sodium 540mg  bid. tacrolimus 12hr lvl 5-7 ng/ml.   3. hypertension. blood pressure goal < 130/80 mmhg.  4. hyperlipidemia. atorvastatin 10mg  daily.  5. preventive medicine. influenza '19. ppsv23 pneumococcal '12 with 5 year booster recommended. pcv13 pneumococcal '19. zoster '20 with booster recommended. renal ultrasound '18 with follow up to be scheduled. colonoscopy '14 with 5 year follow up recommended.    I spent 20 minutes on the phone with the patient. I spent an additional 15 minutes on pre- and post-visit activities.     The patient was physically located in [place] or a state in which I am permitted to provide care. The patient and/or parent/guardian understood that s/he may incur co-pays and cost sharing, and agreed to the telemedicine visit. The visit was reasonable and appropriate under the circumstances given the patient's presentation at the time.    The patient and/or parent/guardian has been advised of the potential risks and limitations of this mode of treatment (including, but not limited to, the absence of in-person examination) and has agreed to be treated using telemedicine. The patient's/patient's family's questions regarding telemedicine have been answered.     If the visit was completed in an ambulatory setting, the patient and/or parent/guardian has also been advised to contact their provider???s office for worsening conditions, and seek emergency medical treatment and/or call 911 if the patient deems either necessary.    history of present illness    Justin Mcknight is a 63 year old gentleman seen in follow up post kidney transplant 09/03/2017. he was contacted with assistance of pacific interpreters/spanish. he feels well. no fever chills or sweats. no headache or lightheaded. no chest pain cough or shortness of breath. no lower extremity edema. no abdominal pain no n/v/d. no myalgias or arthralgias. no dysuria hematuria or difficulty voiding. blood pressure 120s/80s. all other systems reviewed and negative x10 systems.    past medical hx:  1. 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 hypertension. alemtuzumab induction. baseline creatinine 1.6-2 mg/dl.  > kidney bx 03/19: no acute rejection. +mild focal thrombotic microangiopathy.  > kidney bx 05/19: no acute rejection. no thrombotic microangiopathy.  2. hypertension  3. hyperlipidemia  4. hx renal cell carcinoma s/p right nephrectomy '12.    past surgical hx: left forearm av fistula '12. right nephrectomy '12. kidney transplant '19. left forearm av fistula ligation '19.    allergies: codeine    medications: tacrolimus 2mg  bid, mycophenolate sodium 540mg  bid, lisinopril 5mg  q.pm, atorvastatin 10mg  daily, melatonin 3mg  prn.    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    labs 04/21/19: wbc6.3 hgb13.7 hct42.7 plts229. HY865 k5.4 cl108 bicarb24 bun33 cr1.7 glc122 ca9.9 mg1.6 phos3.1. tacrolimus lvl 6.6 ng/ml.

## 2019-04-28 NOTE — Unmapped (Signed)
Justin Mcknight interpreter Justin Mcknight [phone]      Called patient to let him know that he needs a flu shot, shingrix and pnuemo 23 vaccine. He will get shots through PCP.    Also let him know that I sent order for colonscopy, renal US. They will reach out to him to set up apt    Denies any other problems

## 2019-05-18 ENCOUNTER — Ambulatory Visit: Admit: 2019-05-18 | Discharge: 2019-05-19 | Payer: MEDICARE

## 2019-05-18 LAB — CBC W/ AUTO DIFF
BASOPHILS ABSOLUTE COUNT: 0 10*9/L (ref 0.0–0.1)
EOSINOPHILS ABSOLUTE COUNT: 0.1 10*9/L (ref 0.0–0.4)
EOSINOPHILS RELATIVE PERCENT: 2.2 %
HEMATOCRIT: 42.8 % (ref 41.0–53.0)
LARGE UNSTAINED CELLS: 2 % (ref 0–4)
LYMPHOCYTES ABSOLUTE COUNT: 1 10*9/L — ABNORMAL LOW (ref 1.5–5.0)
LYMPHOCYTES RELATIVE PERCENT: 16 %
MEAN CORPUSCULAR HEMOGLOBIN CONC: 32.2 g/dL (ref 31.0–37.0)
MEAN CORPUSCULAR HEMOGLOBIN: 30.1 pg (ref 26.0–34.0)
MEAN PLATELET VOLUME: 7.7 fL (ref 7.0–10.0)
MONOCYTES ABSOLUTE COUNT: 0.4 10*9/L (ref 0.2–0.8)
MONOCYTES RELATIVE PERCENT: 6.1 %
NEUTROPHILS ABSOLUTE COUNT: 4.4 10*9/L (ref 2.0–7.5)
NEUTROPHILS RELATIVE PERCENT: 72.9 %
PLATELET COUNT: 232 10*9/L (ref 150–440)
RED BLOOD CELL COUNT: 4.58 10*12/L (ref 4.50–5.90)
RED CELL DISTRIBUTION WIDTH: 13.9 % (ref 12.0–15.0)
WBC ADJUSTED: 6 10*9/L (ref 4.5–11.0)

## 2019-05-18 LAB — PHOSPHORUS: Phosphate:MCnc:Pt:Ser/Plas:Qn:: 2.9

## 2019-05-18 LAB — URINALYSIS
BILIRUBIN UA: NEGATIVE
BLOOD UA: NEGATIVE
GLUCOSE UA: NEGATIVE
KETONES UA: NEGATIVE
NITRITE UA: NEGATIVE
PH UA: 5.5 (ref 5.0–9.0)
PROTEIN UA: NEGATIVE
RBC UA: 1 /HPF (ref <3)
SPECIFIC GRAVITY UA: 1.015 (ref 1.005–1.040)
SQUAMOUS EPITHELIAL: <1 /HPF (ref 0–5)
UROBILINOGEN UA: 0.2

## 2019-05-18 LAB — ANION GAP: Anion gap 3:SCnc:Pt:Ser/Plas:Qn:: 7

## 2019-05-18 LAB — BASIC METABOLIC PANEL
ANION GAP: 7 mmol/L (ref 7–15)
BLOOD UREA NITROGEN: 28 mg/dL — ABNORMAL HIGH (ref 7–21)
BUN / CREAT RATIO: 15
CALCIUM: 9.7 mg/dL (ref 8.5–10.2)
CHLORIDE: 105 mmol/L (ref 98–107)
EGFR CKD-EPI AA MALE: 42 mL/min/{1.73_m2} — ABNORMAL LOW (ref >=60)
EGFR CKD-EPI NON-AA MALE: 37 mL/min/{1.73_m2} — ABNORMAL LOW (ref >=60)
POTASSIUM: 5.3 mmol/L — ABNORMAL HIGH (ref 3.5–5.0)
SODIUM: 138 mmol/L (ref 135–145)

## 2019-05-18 LAB — TACROLIMUS, TROUGH: Lab: 4.9 — ABNORMAL LOW

## 2019-05-18 LAB — PROTEIN / CREATININE RATIO, URINE: PROTEIN/CREAT RATIO, URINE: 0.044

## 2019-05-18 LAB — MAGNESIUM: Magnesium:MCnc:Pt:Ser/Plas:Qn:: 1.7

## 2019-05-18 LAB — PROTEIN/CREAT RATIO, URINE: Protein/Creatinine:MRto:Pt:Urine:Qn:: 0.044

## 2019-05-18 LAB — MONOCYTES ABSOLUTE COUNT: Monocytes:NCnc:Pt:Bld:Qn:Automated count: 0.4

## 2019-05-18 LAB — ALBUMIN: Albumin:MCnc:Pt:Ser/Plas:Qn:: 4.4

## 2019-05-18 LAB — BACTERIA

## 2019-05-20 NOTE — Unmapped (Signed)
[facility] Pharmacy Refill Coordination Note    Specialty Medication(s) to be Shipped:   Transplant: Myfortic 180mg  and tacrolimus 1mg     Other medication(s) to be shipped: Atorvasatin,Lisinopril     [facility], DOB: 1956/04/05  Phone: [phone] (home)       All above HIPAA information was verified with patient.     Completed refill call assessment today to schedule patient's medication shipment from the [facility] [phone]).       Specialty medication(s) and dose(s) confirmed: Regimen is correct and unchanged.   Changes to medications: Keishawn reports no changes at this time.  Changes to insurance: No  Questions for the pharmacist: No    Confirmed patient received Welcome Packet with first shipment. The patient will receive a drug information handout for each medication shipped and additional FDA Medication Guides as required.       DISEASE/MEDICATION-SPECIFIC INFORMATION        N/A    SPECIALTY MEDICATION ADHERENCE     Medication Adherence    Specialty Medication: tacrolimus 1mg   Patient is on additional specialty medications: Yes  Additional Specialty Medications: Myfortic 180  Patient Reported Additional Medication X Missed Doses in the Last Month: 0  Patient is on more than two specialty medications: No  Informant: patient  Adherence tools used: patient uses a pill box to manage medications  Support network for adherence: family member                Tacrolimus 1 mg: 7 days of medicine on hand   Myfortic 180 mg: 7 days of medicine on hand         SHIPPING     Shipping address confirmed in Epic.     Delivery Scheduled: Yes, Expected medication delivery date: 11/13.     Medication will be delivered via Next Day Courier to the prescription address in [place].    Antonietta Barcelona   [facility] Specialty Technician

## 2019-05-21 MED FILL — TACROLIMUS 1 MG CAPSULE, IMMEDIATE-RELEASE: ORAL | 30 days supply | Qty: 120 | Fill #2

## 2019-05-21 MED FILL — MYFORTIC 180 MG TABLET,DELAYED RELEASE: ORAL | 30 days supply | Qty: 180 | Fill #8

## 2019-05-21 MED FILL — LISINOPRIL 5 MG TABLET: ORAL | 30 days supply | Qty: 30 | Fill #8

## 2019-05-21 MED FILL — MYFORTIC 180 MG TABLET,DELAYED RELEASE: 30 days supply | Qty: 180 | Fill #8 | Status: AC

## 2019-05-21 MED FILL — ATORVASTATIN 10 MG TABLET: 30 days supply | Qty: 30 | Fill #3 | Status: AC

## 2019-05-21 MED FILL — LISINOPRIL 5 MG TABLET: 30 days supply | Qty: 30 | Fill #8 | Status: AC

## 2019-05-21 MED FILL — TACROLIMUS 1 MG CAPSULE: 30 days supply | Qty: 120 | Fill #2 | Status: AC

## 2019-05-21 MED FILL — ATORVASTATIN 10 MG TABLET: ORAL | 30 days supply | Qty: 30 | Fill #3

## 2019-06-18 NOTE — Unmapped (Signed)
[facility] Pharmacy Refill Coordination Note    Specialty Medication(s) to be Shipped:   Transplant: Myfortic 180mg  and tacrolimus 1mg     Other medication(s) to be shipped: atorvastain 10mg  and lisinopril 5mg      [facility], DOB: 20-Apr-1956  Phone: [phone] (home)       All above HIPAA information was verified with patient.     Was a Nurse, learning disability used for this call? Yes, Spanish. Patient language is appropriate in [facility]    Completed refill call assessment today to schedule patient's medication shipment from the [facility] [phone]).       Specialty medication(s) and dose(s) confirmed: Regimen is correct and unchanged.   Changes to medications: Antwan reports no changes at this time.  Changes to insurance: No  Questions for the pharmacist: No    Confirmed patient received Welcome Packet with first shipment. The patient will receive a drug information handout for each medication shipped and additional FDA Medication Guides as required.       DISEASE/MEDICATION-SPECIFIC INFORMATION        N/A    SPECIALTY MEDICATION ADHERENCE     Medication Adherence    Patient reported X missed doses in the last month: 0  Specialty Medication: Myfortic 180mg   Patient is on additional specialty medications: Yes  Additional Specialty Medications: Tacrolimus 1mg   Patient Reported Additional Medication X Missed Doses in the Last Month: 0  Patient is on more than two specialty medications: No  Adherence tools used: patient uses a pill box to manage medications  Support network for adherence: family member          Myfortic 180 mg: 8 days of medicine on hand   Tacrolimus 1 mg: 8 days of medicine on hand     SHIPPING     Shipping address confirmed in Epic.     Delivery Scheduled: Yes, Expected medication delivery date: 06/26/2019. **pt request**    Medication will be delivered via Next Day Courier to the prescription address in Epic [place].    Oretha Milch [facility] Specialty Technician

## 2019-06-22 ENCOUNTER — Ambulatory Visit: Admit: 2019-06-22 | Discharge: 2019-06-23 | Payer: MEDICARE | Attending: Family | Primary: Family

## 2019-06-22 NOTE — Unmapped (Signed)
COVID Pre-Procedure Intake Form     Assessment     Justin Mcknight is a 63 y.o. male presenting to [facility] for COVID testing.     Plan     If no testing performed, pt counseled on routine care for respiratory illness.  If testing performed, COVID sent.  Patient directed to Home given findings during today's visit.    Subjective     Justin Mcknight is a 63 y.o. male who presents to the [facility] with complaints of the following:    Exposure History: In the last 21 days?     Have you traveled outside of [place]? No               Have you been in close contact with someone confirmed by a test to have COVID? (Close contact is within 6 feet for at least 10 minutes) No       Have you worked in a health care facility? No     Lived or worked facility like a nursing home, group home, or assisted living?    No         Are you scheduled to have surgery or a procedure in the next 3 days? Yes               Are you scheduled to receive cancer chemotherapy within the next 7 days?    No     Have you ever been tested before for COVID-19 with a swab of your nose? No   Are you a healthcare worker being tested so to return to work No     Right now,  do you have any of the following that developed over the past 7 days (as stated by patient on intake form):    Subjective fever (felt feverish) No   Chills (especially repeated shaking chills) No   Severe fatigue (felt very tired) No   Muscle aches No   Runny nose No   Sore throat No   Loss of taste or smell No   Cough (new onset or worsening of chronic cough) No   Shortness of breath No   Nausea or vomiting No   Headache No   Abdominal Pain No   Diarrhea (3 or more loose stools in last 24 hours) No Scribe's Attestation: Aida Puffer, FNP obtained and performed the history, physical exam and medical decision making elements that were entered into the chart.  Signed by Victorino Sparrow serving as Scribe, on 06/22/2019 10:17 AM      The documentation recorded by the scribe accurately reflects the service I personally performed and the decisions made by me. Aida Puffer, FNP  June 22, 2019 11:31 AM

## 2019-06-25 ENCOUNTER — Encounter: Admit: 2019-06-25 | Discharge: 2019-06-25 | Payer: MEDICARE | Attending: Anesthesiology | Primary: Anesthesiology

## 2019-06-25 ENCOUNTER — Ambulatory Visit: Admit: 2019-06-25 | Discharge: 2019-06-25 | Payer: MEDICARE

## 2019-06-25 MED ADMIN — propofoL (DIPRIVAN) injection: INTRAVENOUS | @ 18:00:00 | Stop: 2019-06-25

## 2019-06-25 MED ADMIN — propofoL (DIPRIVAN) injection: INTRAVENOUS | @ 19:00:00 | Stop: 2019-06-25

## 2019-06-25 MED ADMIN — lidocaine (XYLOCAINE) 20 mg/mL (2 %) injection: INTRAVENOUS | @ 18:00:00 | Stop: 2019-06-25

## 2019-06-25 MED ADMIN — phenylephrine HCl in 0.9% NaCl 0.8 mg/10 mL (80 mcg/mL) injection Syrg: INTRAVENOUS | @ 18:00:00 | Stop: 2019-06-25

## 2019-06-25 MED ADMIN — sodium chloride (NS) 0.9 % infusion: 10 mL/h | INTRAVENOUS | @ 18:00:00 | Stop: 2019-06-25

## 2019-06-25 MED FILL — MYFORTIC 180 MG TABLET,DELAYED RELEASE: 30 days supply | Qty: 180 | Fill #9 | Status: AC

## 2019-06-25 MED FILL — LISINOPRIL 5 MG TABLET: 30 days supply | Qty: 30 | Fill #9 | Status: AC

## 2019-06-25 MED FILL — ATORVASTATIN 10 MG TABLET: 30 days supply | Qty: 30 | Fill #4 | Status: AC

## 2019-06-25 MED FILL — MYFORTIC 180 MG TABLET,DELAYED RELEASE: ORAL | 30 days supply | Qty: 180 | Fill #9

## 2019-06-25 MED FILL — TACROLIMUS 1 MG CAPSULE, IMMEDIATE-RELEASE: ORAL | 30 days supply | Qty: 120 | Fill #3

## 2019-06-25 MED FILL — LISINOPRIL 5 MG TABLET: ORAL | 30 days supply | Qty: 30 | Fill #9

## 2019-06-25 MED FILL — ATORVASTATIN 10 MG TABLET: ORAL | 30 days supply | Qty: 30 | Fill #4

## 2019-06-25 MED FILL — TACROLIMUS 1 MG CAPSULE: 30 days supply | Qty: 120 | Fill #3 | Status: AC

## 2019-06-25 NOTE — Unmapped (Signed)
See Provation note in Procedures tab and full report with images in Media tab labeled Operative Procedure Reports

## 2019-07-14 NOTE — Unmapped (Signed)
[facility] Clinical Assessment & Refill Coordination Note    Justin Mcknight, [place]: [birth year]  Phone: [phone] (home)     All above HIPAA information was verified with patient.     Was a Nurse, learning disability used for this call? Yes, Spanish interpreter [place].. Patient language is appropriate in [facility]    Specialty Medication(s):   Transplant: Myfortic 180mg  and tacrolimus 1mg      Current Outpatient Medications   Medication Sig Dispense Refill   ??? atorvastatin (LIPITOR) 10 MG tablet Take 1 tablet (10 mg total) by mouth daily. 30 tablet 11   ??? lisinopriL (PRINIVIL,ZESTRIL) 5 MG tablet Take 1 tablet (5 mg total) by mouth every evening. 30 tablet 11   ??? MYFORTIC 180 mg EC tablet Take 3 tablets (540mg ) by mouth 2 times daily 180 tablet 11   ??? tacrolimus (PROGRAF) 1 MG capsule Take 2 capsules (2 mg total) by mouth two (2) times a day. 120 capsule 11     No current facility-administered medications for this visit.         Changes to medications: Jahmez reports no changes at this time.    Allergies   Allergen Reactions   ??? Codeine Swelling       Changes to allergies: No    SPECIALTY MEDICATION ADHERENCE     Myfortic 180 mg: 7 days of medicine on hand   Tacrolimus 1 mg: 7 days of medicine on hand       Medication Adherence    Patient reported X missed doses in the last month: 0  Specialty Medication: Myfortic 180mg   Patient is on additional specialty medications: Yes  Additional Specialty Medications: Tacrolimus 1mg   Patient Reported Additional Medication X Missed Doses in the Last Month: 0  Adherence tools used: patient uses a pill box to manage medications  Support network for adherence: family member          Specialty medication(s) dose(s) confirmed: Regimen is correct and unchanged.     Are there any concerns with adherence? No    Adherence counseling provided? Not needed    CLINICAL MANAGEMENT AND INTERVENTION      Clinical Benefit Assessment: Do you feel the medicine is effective or helping your condition? Yes    Clinical Benefit counseling provided? Not needed    Adverse Effects Assessment:    Are you experiencing any side effects? No    Are you experiencing difficulty administering your medicine? No    Quality of Life Assessment:    How many days over the past month did your kidney transplant  keep you from your normal activities? For example, brushing your teeth or getting up in the morning. 0    Have you discussed this with your provider? Not needed    Therapy Appropriateness:    Is therapy appropriate? Yes, therapy is appropriate and should be continued    DISEASE/MEDICATION-SPECIFIC INFORMATION      N/A    PATIENT SPECIFIC NEEDS     ? Does the patient have any physical, cognitive, or cultural barriers? No    ? Is the patient high risk? Yes, patient is taking a REMS drug. Medication is dispensed in compliance with REMS program.     ? Does the patient require a Care Management Plan? No     ? Does the patient require physician intervention or other additional services (i.e. nutrition, smoking cessation, social work)? No      SHIPPING     Specialty Medication(s) to be  Shipped:   Transplant: Myfortic 180mg  and tacrolimus 1mg     Other medication(s) to be shipped: Atorvastatin, Lisinopril     Changes to insurance: No    Delivery Scheduled: Yes, Expected medication delivery date: 07/21/2019.     Medication will be delivered via Same Day Courier to the confirmed prescription address in [facility].    The patient will receive a drug information handout for each medication shipped and additional FDA Medication Guides as required.  Verified that patient has previously received a Conservation officer, historic buildings.    All of the patient's questions and concerns have been addressed.    Tera Helper   [facility] Specialty Pharmacist

## 2019-07-21 MED FILL — MYFORTIC 180 MG TABLET,DELAYED RELEASE: 30 days supply | Qty: 180 | Fill #10 | Status: AC

## 2019-07-21 MED FILL — LISINOPRIL 5 MG TABLET: 30 days supply | Qty: 30 | Fill #10 | Status: AC

## 2019-07-21 MED FILL — MYFORTIC 180 MG TABLET,DELAYED RELEASE: ORAL | 30 days supply | Qty: 180 | Fill #10

## 2019-07-21 MED FILL — ATORVASTATIN 10 MG TABLET: 30 days supply | Qty: 30 | Fill #5 | Status: AC

## 2019-07-21 MED FILL — ATORVASTATIN 10 MG TABLET: ORAL | 30 days supply | Qty: 30 | Fill #5

## 2019-07-21 MED FILL — TACROLIMUS 1 MG CAPSULE: 30 days supply | Qty: 120 | Fill #4 | Status: AC

## 2019-07-21 MED FILL — LISINOPRIL 5 MG TABLET: ORAL | 30 days supply | Qty: 30 | Fill #10

## 2019-07-21 MED FILL — TACROLIMUS 1 MG CAPSULE, IMMEDIATE-RELEASE: ORAL | 30 days supply | Qty: 120 | Fill #4

## 2019-08-07 NOTE — Unmapped (Signed)
left message, called to fill out MSPQ for 2/19 appt w/Dr Toni Arthurs

## 2019-08-19 NOTE — Unmapped (Signed)
[facility] Refill Coordination Note    Specialty Medication(s) to be Shipped:   Transplant: Myfortic 180mg  and tacrolimus 1mg     Other medication(s) to be shipped: Atorvastatin and Lisinopril     [facility], DOB: 1955/12/09  Phone: [phone] (home)       All above HIPAA information was verified with patient.     Was a Nurse, learning disability used for this call? No    Completed refill call assessment today to schedule patient's medication shipment from the [facility] [phone]).       Specialty medication(s) and dose(s) confirmed: Regimen is correct and unchanged.   Changes to medications: Ronith reports no changes at this time.  Changes to insurance: No  Questions for the pharmacist: No    Confirmed patient received Welcome Packet with first shipment. The patient will receive a drug information handout for each medication shipped and additional FDA Medication Guides as required.       DISEASE/MEDICATION-SPECIFIC INFORMATION        N/A    SPECIALTY MEDICATION ADHERENCE     Medication Adherence    Patient reported X missed doses in the last month: 0  Specialty Medication: Myfortic 180 mg  Patient is on additional specialty medications: Yes  Additional Specialty Medications: Tacrolimus 1 mg  Patient Reported Additional Medication X Missed Doses in the Last Month: 0  Patient is on more than two specialty medications: No  Any gaps in refill history greater than 2 weeks in the last 3 months: no  Demonstrates understanding of importance of adherence: yes  Informant: patient  Reliability of informant: reliable  Adherence tools used: patient uses a pill box to manage medications  Support network for adherence: family member  Confirmed plan for next specialty medication refill: delivery by pharmacy  Refills needed for supportive medications: not needed                Myfortic 180 mg. 7 days on hand  Tacrolimus 1 mg. 7 days on hand      SHIPPING     Shipping address confirmed in Epic. Delivery Scheduled: Yes, Expected medication delivery date: 08/21/2019.     Medication will be delivered via UPS to the prescription address in Epic WAM.    Tanashia Ciesla D Omid Deardorff   [facility] Shared [facility] Specialty Technician

## 2019-08-20 MED FILL — MYFORTIC 180 MG TABLET,DELAYED RELEASE: ORAL | 30 days supply | Qty: 180 | Fill #11

## 2019-08-20 MED FILL — MYFORTIC 180 MG TABLET,DELAYED RELEASE: 30 days supply | Qty: 180 | Fill #11 | Status: AC

## 2019-08-20 MED FILL — TACROLIMUS 1 MG CAPSULE, IMMEDIATE-RELEASE: ORAL | 30 days supply | Qty: 120 | Fill #5

## 2019-08-20 MED FILL — LISINOPRIL 5 MG TABLET: ORAL | 30 days supply | Qty: 30 | Fill #11

## 2019-08-20 MED FILL — LISINOPRIL 5 MG TABLET: 30 days supply | Qty: 30 | Fill #11 | Status: AC

## 2019-08-20 MED FILL — TACROLIMUS 1 MG CAPSULE: 30 days supply | Qty: 120 | Fill #5 | Status: AC

## 2019-08-20 MED FILL — ATORVASTATIN 10 MG TABLET: 30 days supply | Qty: 30 | Fill #6 | Status: AC

## 2019-08-20 MED FILL — ATORVASTATIN 10 MG TABLET: ORAL | 30 days supply | Qty: 30 | Fill #6

## 2019-08-25 ENCOUNTER — Ambulatory Visit: Admit: 2019-08-25 | Discharge: 2019-08-26 | Payer: MEDICARE

## 2019-08-25 LAB — URINALYSIS
BACTERIA: NONE SEEN /HPF
BILIRUBIN UA: NEGATIVE
GLUCOSE UA: NEGATIVE
KETONES UA: NEGATIVE
LEUKOCYTE ESTERASE UA: NEGATIVE
NITRITE UA: NEGATIVE
PH UA: 5.5 (ref 5.0–9.0)
PROTEIN UA: NEGATIVE
SPECIFIC GRAVITY UA: 1.02 (ref 1.005–1.040)
SQUAMOUS EPITHELIAL: 1 /HPF (ref 0–5)
UROBILINOGEN UA: 0.2
WBC UA: 2 /HPF — ABNORMAL HIGH (ref <2)

## 2019-08-25 LAB — BASIC METABOLIC PANEL
ANION GAP: 10 mmol/L (ref 7–15)
BLOOD UREA NITROGEN: 30 mg/dL — ABNORMAL HIGH (ref 7–21)
BUN / CREAT RATIO: 17
CALCIUM: 9.7 mg/dL (ref 8.5–10.2)
CHLORIDE: 104 mmol/L (ref 98–107)
CO2: 26 mmol/L (ref 22.0–30.0)
CREATININE: 1.73 mg/dL — ABNORMAL HIGH (ref 0.70–1.30)
EGFR CKD-EPI AA MALE: 48 mL/min/{1.73_m2} — ABNORMAL LOW (ref >=60)
EGFR CKD-EPI NON-AA MALE: 41 mL/min/{1.73_m2} — ABNORMAL LOW (ref >=60)
POTASSIUM: 5.4 mmol/L — ABNORMAL HIGH (ref 3.5–5.0)
SODIUM: 140 mmol/L (ref 135–145)

## 2019-08-25 LAB — CBC W/ AUTO DIFF
BASOPHILS ABSOLUTE COUNT: 0.1 10*9/L (ref 0.0–0.1)
BASOPHILS RELATIVE PERCENT: 0.8 %
EOSINOPHILS ABSOLUTE COUNT: 0.1 10*9/L (ref 0.0–0.7)
EOSINOPHILS RELATIVE PERCENT: 1.9 %
HEMATOCRIT: 43.7 % (ref 38.0–50.0)
LYMPHOCYTES ABSOLUTE COUNT: 0.9 10*9/L (ref 0.7–4.0)
LYMPHOCYTES RELATIVE PERCENT: 13.4 %
MEAN CORPUSCULAR HEMOGLOBIN CONC: 33.4 g/dL (ref 30.0–36.0)
MEAN CORPUSCULAR HEMOGLOBIN: 30.5 pg (ref 26.0–34.0)
MEAN PLATELET VOLUME: 9.2 fL (ref 7.0–10.0)
MONOCYTES ABSOLUTE COUNT: 0.5 10*9/L (ref 0.1–1.0)
MONOCYTES RELATIVE PERCENT: 7.6 %
NEUTROPHILS ABSOLUTE COUNT: 4.9 10*9/L (ref 1.7–7.7)
NEUTROPHILS RELATIVE PERCENT: 76.3 %
PLATELET COUNT: 194 10*9/L (ref 150–450)
RED BLOOD CELL COUNT: 4.77 10*12/L (ref 4.32–5.72)
RED CELL DISTRIBUTION WIDTH: 14.6 % (ref 12.0–15.0)

## 2019-08-25 LAB — PROTEIN / CREATININE RATIO, URINE: PROTEIN/CREAT RATIO, URINE: 0.077

## 2019-08-25 LAB — MEAN PLATELET VOLUME: Platelet mean volume:EntVol:Pt:Bld:Qn:Automated count: 9.2

## 2019-08-25 LAB — CREATININE, URINE: Lab: 106.6

## 2019-08-25 LAB — MAGNESIUM: Magnesium:MCnc:Pt:Ser/Plas:Qn:: 1.6

## 2019-08-25 LAB — PHOSPHORUS: Phosphate:MCnc:Pt:Ser/Plas:Qn:: 2.6 — ABNORMAL LOW

## 2019-08-25 LAB — BLOOD UA

## 2019-08-25 LAB — CHLORIDE: Chloride:SCnc:Pt:Ser/Plas:Qn:: 104

## 2019-08-26 LAB — TACROLIMUS, TROUGH: Lab: 6.1

## 2019-08-28 MED ORDER — LISINOPRIL 5 MG TABLET
ORAL_TABLET | Freq: Every evening | ORAL | 11 refills | 30.00000 days | Status: CP
Start: 2019-08-28 — End: 2020-08-27
  Filled 2019-09-17: qty 60, 30d supply, fill #0

## 2019-09-09 DIAGNOSIS — Z940 Kidney transplant status: Principal | ICD-10-CM

## 2019-09-09 NOTE — Unmapped (Signed)
[facility] Refill Coordination Note    Specialty Medication(s) to be Shipped:   Transplant: Myfortic 180mg  and tacrolimus 1mg    **Sent rf request for Myfortic 180mg **    Other medication(s) to be shipped: atorvastatin 10mg  and lisinopril 5mg      [facility], DOB: [birth year]  Phone: [phone] (home)       All above HIPAA information was verified with patient.     Was a Nurse, learning disability used for this call? Yes, Spanish. Patient language is appropriate in [facility]    Completed refill call assessment today to schedule patient's medication shipment from the [facility] [phone]).       Specialty medication(s) and dose(s) confirmed: Regimen is correct and unchanged.   Changes to medications: Keiandre reports no changes at this time.  Changes to insurance: No  Questions for the pharmacist: No    Confirmed patient received Welcome Packet with first shipment. The patient will receive a drug information handout for each medication shipped and additional FDA Medication Guides as required.       DISEASE/MEDICATION-SPECIFIC INFORMATION        N/A    SPECIALTY MEDICATION ADHERENCE     Medication Adherence    Patient reported X missed doses in the last month: 0  Specialty Medication: Tacrolimus 1mg   Patient is on additional specialty medications: Yes  Additional Specialty Medications: Myfortic 180mg   Patient Reported Additional Medication X Missed Doses in the Last Month: 0  Patient is on more than two specialty medications: No  Adherence tools used: patient uses a pill box to manage medications  Support network for adherence: family member          Tacrolimus 1 mg: 14 days of medicine on hand   Myfortic 180 mg: 14 days of medicine on hand     SHIPPING     Shipping address confirmed in Epic.     Delivery Scheduled: Yes, Expected medication delivery date: 09/18/2019.     Medication will be delivered via Next Day Courier to the prescription address in Epic WAM.    Oretha Milch   [facility] Specialty Technician

## 2019-09-10 MED ORDER — MYFORTIC 180 MG TABLET,DELAYED RELEASE
ORAL_TABLET | Freq: Two times a day (BID) | ORAL | 11 refills | 30.00000 days | Status: CP
Start: 2019-09-10 — End: 2020-09-09
  Filled 2019-09-17: qty 180, 30d supply, fill #0

## 2019-09-13 NOTE — Unmapped (Signed)
[facility] [employer] [facility] transplant nephrology telemedicine visit    assessment and plan  1. s/p kidney transplant 09/03/2017. baseline creatinine 1.6-2 mg/dl. no proteinuria. no donor specific hla ab detected.   2. immunosuppression. mycophenolate sodium 540mg  bid. tacrolimus 12hr lvl 5-7 ng/ml.   3. hypertension. lisinopril incr 10mg  q.pm. blood pressure goal < 130/80 mmhg.  4. hyperlipidemia. atorvastatin 10mg  daily.  5. preventive medicine. influenza '20. ppsv23 pneumococcal '12 with 5 year booster recommended at next clinic appt. pcv13 pneumococcal '19. zoster '20. covid-19 vaccine recommended when available.  colonoscopy '14 with 5 year follow up recommended. kidney ultrasound '19 with annual follow up to be scheduled.    I spent 25 minutes on the phone with the patient on the date of service. I spent an additional 15 minutes on pre- and post-visit activities on the date of service.     The patient was physically located in [place] or a state in which I am permitted to provide care. The patient and/or parent/guardian understood that s/he may incur co-pays and cost sharing, and agreed to the telemedicine visit. The visit was reasonable and appropriate under the circumstances given the patient's presentation at the time.    The patient and/or parent/guardian has been advised of the potential risks and limitations of this mode of treatment (including, but not limited to, the absence of in-person examination) and has agreed to be treated using telemedicine. The patient's/patient's family's questions regarding telemedicine have been answered.     If the visit was completed in an ambulatory setting, the patient and/or parent/guardian has also been advised to contact their provider???s office for worsening conditions, and seek emergency medical treatment and/or call 911 if the patient deems either necessary.    history of present illness    [name] is a 64 year old gentleman seen in follow up post kidney transplant 09/03/2017. he was contacted with assistance of pacific spanish interpreter. he feels well. no fever chills or sweats. no headache or lightheaded. no chest pain cough or shortness of breath. no lower extremity edema. no abdominal pain no n/v/d. no myalgias or arthralgias. no dysuria or difficulty voiding. blood pressure avg 120-130s/70-80s. all other systems reviewed and negative x10 systems.    past medical hx:  1. 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 hypertension. alemtuzumab induction. baseline creatinine 1.6-2 mg/dl.  > kidney bx 03/19: no acute rejection. +mild focal thrombotic microangiopathy.  > kidney bx 05/19: no acute rejection. no thrombotic microangiopathy.  2. hypertension  3. hyperlipidemia  4. hx renal cell carcinoma s/p right nephrectomy '12.    past surgical hx: left forearm av fistula '12. right nephrectomy '12. kidney transplant '19. left forearm av fistula ligation '19.    allergies: codeine    medications: tacrolimus 2mg  bid, mycophenolate sodium 540mg  bid, lisinopril 5mg  q.pm, atorvastatin 10mg  daily, melatonin 3mg  prn.    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    no physical exam d/t telemedicine.    labs 08/25/19 reviewed.

## 2019-09-17 MED FILL — MYFORTIC 180 MG TABLET,DELAYED RELEASE: 30 days supply | Qty: 180 | Fill #0 | Status: AC

## 2019-09-17 MED FILL — LISINOPRIL 5 MG TABLET: 30 days supply | Qty: 60 | Fill #0 | Status: AC

## 2019-09-17 MED FILL — ATORVASTATIN 10 MG TABLET: 30 days supply | Qty: 30 | Fill #7 | Status: AC

## 2019-09-17 MED FILL — TACROLIMUS 1 MG CAPSULE, IMMEDIATE-RELEASE: 30 days supply | Qty: 120 | Fill #6 | Status: AC

## 2019-09-17 MED FILL — TACROLIMUS 1 MG CAPSULE, IMMEDIATE-RELEASE: ORAL | 30 days supply | Qty: 120 | Fill #6

## 2019-09-17 MED FILL — ATORVASTATIN 10 MG TABLET: ORAL | 30 days supply | Qty: 30 | Fill #7

## 2019-09-21 NOTE — Unmapped (Signed)
Update unos

## 2019-09-27 ENCOUNTER — Ambulatory Visit: Payer: Medicare Other | Attending: Internal Medicine

## 2019-09-27 DIAGNOSIS — Z23 Encounter for immunization: Secondary | ICD-10-CM

## 2019-09-27 NOTE — Progress Notes (Signed)
   Covid-19 Vaccination Clinic  Name:  Christopher Hickman    MRN: [id number] DOB: [birth year]  09/27/2019  Mr. Christopher Hickman was observed post Covid-19 immunization for 30 minutes without incident. He was provided with Vaccine Information Sheet and instruction to access the V-Safe system.   Mr. Christopher Hickman was instructed to call 911 with any severe reactions post vaccine: Marland Kitchen Difficulty breathing  . Swelling of face and throat  . A fast heartbeat  . A bad rash all over body  . Dizziness and weakness   Immunizations Administered    Name Date Dose VIS Date Route   Pfizer COVID-19 Vaccine 09/27/2019 11:16 AM 0.3 mL 06/19/2019 Intramuscular   Manufacturer: [place]   Lot: [id number]   [place]: [id number]

## 2019-10-16 NOTE — Unmapped (Signed)
[facility] Refill Coordination Note  Medication: Myfortic, tacrolimus     Unable to reach patient to schedule shipment for medication being filled at [facility]. Left voicemail on phone.with interpreter  As this is the 3rd unsuccessful attempt to reach the patient, no additional phone call attempts will be made at this time.      Phone numbers attempted: [phone] with interpreter  Last scheduled delivery: 09/17/19    Please call the [facility] at [phone] (option 4) should you have any further questions.      Thanks,  [facility] Shared [facility] Specialty Team

## 2019-10-18 ENCOUNTER — Ambulatory Visit: Payer: Medicare Other | Attending: Internal Medicine

## 2019-10-18 DIAGNOSIS — Z23 Encounter for immunization: Secondary | ICD-10-CM

## 2019-10-18 NOTE — Progress Notes (Signed)
   Covid-19 Vaccination Clinic  Name:  Christopher Hickman    MRN: [id number] DOB: February 23, 1956  10/18/2019  Mr. Christopher Hickman was observed post Covid-19 immunization for 15 minutes without incident. He was provided with Vaccine Information Sheet and instruction to access the V-Safe system.   Mr. Christopher Hickman was instructed to call 911 with any severe reactions post vaccine: Marland Kitchen Difficulty breathing  . Swelling of face and throat  . A fast heartbeat  . A bad rash all over body  . Dizziness and weakness   Immunizations Administered    Name Date Dose VIS Date Route   Pfizer COVID-19 Vaccine 10/18/2019 11:38 AM 0.3 mL 06/19/2019 Intramuscular   Manufacturer: [place]   Lot: [id number]   [place]: [id number]

## 2019-10-26 NOTE — Unmapped (Signed)
[facility] Refill Coordination Note    Specialty Medication(s) to be Shipped:   Transplant: Myfortic 180mg  and tacrolimus 1mg        Other medication(s) to be shipped: atorvastatin 10mg  and lisinopril 5mg      [facility], DOB: August 06, 1955  Phone: [phone] (home)       All above HIPAA information was verified with patient.     Was a Nurse, learning disability used for this call? No    Completed refill call assessment today to schedule patient's medication shipment from the [facility] [phone]).       Specialty medication(s) and dose(s) confirmed: Regimen is correct and unchanged.   Changes to medications: Rylen reports no changes at this time.  Changes to insurance: No  Questions for the pharmacist: No    Confirmed patient received Welcome Packet with first shipment. The patient will receive a drug information handout for each medication shipped and additional FDA Medication Guides as required.       DISEASE/MEDICATION-SPECIFIC INFORMATION        N/A    SPECIALTY MEDICATION ADHERENCE     Medication Adherence    Patient reported X missed doses in the last month: 0  Specialty Medication: Myfortic 180 mg  Patient is on additional specialty medications: Yes  Additional Specialty Medications: Tacrolimus 1 mg  Patient Reported Additional Medication X Missed Doses in the Last Month: 0  Patient is on more than two specialty medications: No  Any gaps in refill history greater than 2 weeks in the last 3 months: no  Demonstrates understanding of importance of adherence: yes  Informant: patient  Reliability of informant: reliable  Adherence tools used: patient uses a pill box to manage medications  Support network for adherence: family member  Confirmed plan for next specialty medication refill: delivery by pharmacy  Refills needed for supportive medications: not needed          Tacrolimus 1 mg: 7 days of medicine on hand   Myfortic 180 mg: 7 days of medicine on hand     SHIPPING     Shipping address confirmed in Epic.     Delivery Scheduled: Yes, Expected medication delivery date: 10/28/2019.     Medication will be delivered via Next Day Courier to the prescription address in Epic WAM.    Shalea Tomczak D Dareion Kneece   [facility] Specialty Technician

## 2019-10-27 MED FILL — ATORVASTATIN 10 MG TABLET: 30 days supply | Qty: 30 | Fill #8 | Status: AC

## 2019-10-27 MED FILL — TACROLIMUS 1 MG CAPSULE, IMMEDIATE-RELEASE: ORAL | 30 days supply | Qty: 120 | Fill #7

## 2019-10-27 MED FILL — TACROLIMUS 1 MG CAPSULE, IMMEDIATE-RELEASE: 30 days supply | Qty: 120 | Fill #7 | Status: AC

## 2019-10-27 MED FILL — ATORVASTATIN 10 MG TABLET: ORAL | 30 days supply | Qty: 30 | Fill #8

## 2019-10-27 MED FILL — MYFORTIC 180 MG TABLET,DELAYED RELEASE: ORAL | 30 days supply | Qty: 180 | Fill #1

## 2019-10-27 MED FILL — LISINOPRIL 5 MG TABLET: ORAL | 30 days supply | Qty: 60 | Fill #1

## 2019-10-27 MED FILL — LISINOPRIL 5 MG TABLET: 30 days supply | Qty: 60 | Fill #1 | Status: AC

## 2019-10-27 MED FILL — MYFORTIC 180 MG TABLET,DELAYED RELEASE: 30 days supply | Qty: 180 | Fill #1 | Status: AC

## 2019-11-06 ENCOUNTER — Ambulatory Visit: Admit: 2019-11-06 | Discharge: 2019-11-07 | Payer: MEDICARE

## 2019-11-06 DIAGNOSIS — H4010X3 Unspecified open-angle glaucoma, severe stage: Principal | ICD-10-CM

## 2019-11-06 DIAGNOSIS — H269 Unspecified cataract: Principal | ICD-10-CM

## 2019-11-06 DIAGNOSIS — H11001 Unspecified pterygium of right eye: Principal | ICD-10-CM

## 2019-11-06 NOTE — Unmapped (Addendum)
Assessment:     Diagnosis ICD-10-CM Associated Orders   1. Advanced open-angle glaucoma, severe stage  H40.10X3    2. Pterygium eye, right  H11.001    3. Incipient cataract of both eyes  H26.9      Diagnosed with Glaucoma in November   And started on Combigan BID and Xalatan HS , both eyes   IOP controlled but cupping is advanced     Kidney transplant , oral steroids      OCT NFL and VF 24-2 were not done (to be done at the glaucoma visit )     Plan:    continue Combigan and Xalatan   See glaucoma within 3 weeks   OCT NFL and VF 24-2 on arrival

## 2019-11-18 NOTE — Unmapped (Signed)
Exam notes were requested by Darlene at [facility] [phone] but I am showing he was referred by [facility] to [place] and don't see anything about [facility] in his chart.  Left a message on Darlene's voicemail to call us back with more information.  Perhaps it is a different patient.

## 2019-11-19 NOTE — Unmapped (Signed)
Faxed clinic notes to [facility] today

## 2019-11-20 NOTE — Unmapped (Signed)
[facility] Refill Coordination Note    Specialty Medication(s) to be Shipped:   Transplant: Myfortic 180mg  and tacrolimus 1mg     Other medication(s) to be shipped: atorvastatin 10mg  and lisinopril 5mg      [facility], DOB: 01/16/1956  Phone: [phone] (home)       All above HIPAA information was verified with patient.     Was a Nurse, learning disability used for this call? Yes, [name]. Patient language is appropriate in [facility]    Completed refill call assessment today to schedule patient's medication shipment from the [facility] [phone]).       Specialty medication(s) and dose(s) confirmed: Regimen is correct and unchanged.   Changes to medications: Justin Mcknight reports no changes at this time.  Changes to insurance: No  Questions for the pharmacist: No    Confirmed patient received Welcome Packet with first shipment. The patient will receive a drug information handout for each medication shipped and additional FDA Medication Guides as required.       DISEASE/MEDICATION-SPECIFIC INFORMATION        N/A    SPECIALTY MEDICATION ADHERENCE     Medication Adherence    Patient reported X missed doses in the last month: 0  Specialty Medication: myfortic 180mg   Patient is on additional specialty medications: Yes  Additional Specialty Medications: Tacrolimus 1mg   Patient Reported Additional Medication X Missed Doses in the Last Month: 0  Patient is on more than two specialty medications: No  Adherence tools used: patient uses a pill box to manage medications  Support network for adherence: family member          Myfortic 180 mg: 6 days of medicine on hand   Tacrolimus 1 mg: 6 days of medicine on hand     SHIPPING     Shipping address confirmed in Epic.     Delivery Scheduled: Yes, Expected medication delivery date: 11/24/2019.     Medication will be delivered via Next Day Courier to the prescription address in Epic WAM.    Oretha Milch   [facility] Specialty Technician

## 2019-11-23 MED FILL — TACROLIMUS 1 MG CAPSULE, IMMEDIATE-RELEASE: 30 days supply | Qty: 120 | Fill #8 | Status: AC

## 2019-11-23 MED FILL — ATORVASTATIN 10 MG TABLET: ORAL | 30 days supply | Qty: 30 | Fill #9

## 2019-11-23 MED FILL — MYFORTIC 180 MG TABLET,DELAYED RELEASE: ORAL | 30 days supply | Qty: 180 | Fill #2

## 2019-11-23 MED FILL — ATORVASTATIN 10 MG TABLET: 30 days supply | Qty: 30 | Fill #9 | Status: AC

## 2019-11-23 MED FILL — MYFORTIC 180 MG TABLET,DELAYED RELEASE: 30 days supply | Qty: 180 | Fill #2 | Status: AC

## 2019-11-23 MED FILL — TACROLIMUS 1 MG CAPSULE, IMMEDIATE-RELEASE: ORAL | 30 days supply | Qty: 120 | Fill #8

## 2019-11-23 MED FILL — LISINOPRIL 5 MG TABLET: ORAL | 30 days supply | Qty: 60 | Fill #2

## 2019-11-23 MED FILL — LISINOPRIL 5 MG TABLET: 30 days supply | Qty: 60 | Fill #2 | Status: AC

## 2019-12-21 NOTE — Unmapped (Signed)
[facility] Clinical Assessment & Refill Coordination Note    Justin Mcknight [name], [name]: October 10, 1955  Phone: [phone] (home)     All above HIPAA information was verified with patient.     Was a Nurse, learning disability used for this call? [name] #[id number]. Patient language is appropriate in [facility]    Specialty Medication(s):   Transplant: Myfortic 180mg  and tacrolimus 1mg      Current Outpatient Medications   Medication Sig Dispense Refill   ??? atorvastatin (LIPITOR) 10 MG tablet Take 1 tablet (10 mg total) by mouth daily. 30 tablet 11   ??? brimonidine-timoloL (COMBIGAN) 0.2-0.5 % ophthalmic solution Administer 1 drop to both eyes every twelve (12) hours.     ??? latanoprost (XALATAN) 0.005 % ophthalmic solution Administer 1 drop to both eyes nightly.     ??? lisinopriL (PRINIVIL,ZESTRIL) 5 MG tablet Take 2 tablets (10 mg total) by mouth every evening. 60 tablet 11   ??? MYFORTIC 180 mg EC tablet Take 3 tablets (540mg ) by mouth 2 times daily 180 tablet 11   ??? tacrolimus (PROGRAF) 1 MG capsule Take 2 capsules (2 mg total) by mouth two (2) times a day. 120 capsule 11     No current facility-administered medications for this visit.        Changes to medications: [name] reports no changes at this time.    Allergies   Allergen Reactions   ??? Codeine Swelling       Changes to allergies: No    SPECIALTY MEDICATION ADHERENCE     Myfortic 180 mg: 7 days of medicine on hand   Tacrolimus 1 mg: 7 days of medicine on hand       Medication Adherence    Patient reported X missed doses in the last month: 1  Specialty Medication: Myfortic 180mg   Patient is on additional specialty medications: Yes  Additional Specialty Medications: Tacrolimus 1mg   Patient Reported Additional Medication X Missed Doses in the Last Month: 0  Patient is on more than two specialty medications: No  Adherence tools used: patient uses a pill box to manage medications  Support network for adherence: family member          Specialty medication(s) dose(s) confirmed: Regimen is correct and unchanged.     Are there any concerns with adherence? No    Adherence counseling provided? Not needed    CLINICAL MANAGEMENT AND INTERVENTION      Clinical Benefit Assessment:    Do you feel the medicine is effective or helping your condition? Yes    Clinical Benefit counseling provided? Not needed    Adverse Effects Assessment:    Are you experiencing any side effects? No    Are you experiencing difficulty administering your medicine? No    Quality of Life Assessment:    How many days over the past month did your kidney transplant  keep you from your normal activities? For example, brushing your teeth or getting up in the morning. 0    Have you discussed this with your provider? Not needed    Therapy Appropriateness:    Is therapy appropriate? Yes, therapy is appropriate and should be continued    DISEASE/MEDICATION-SPECIFIC INFORMATION      N/A    PATIENT SPECIFIC NEEDS     - Does the patient have any physical, cognitive, or cultural barriers? No    - Is the patient high risk? Yes, patient is taking a REMS drug. Medication is dispensed in compliance with REMS program.     -  Does the patient require a Care Management Plan? No     - Does the patient require physician intervention or other additional services (i.e. nutrition, smoking cessation, social work)? No      SHIPPING     Specialty Medication(s) to be Shipped:   Transplant: Myfortic 180mg  and tacrolimus 1mg     Other medication(s) to be shipped: atorvastatin, lisinopril     Changes to insurance: No    Delivery Scheduled: Yes, Expected medication delivery date: 12/25/19.     Medication will be delivered via Same Day Courier to the confirmed prescription address in [facility].    The patient will receive a drug information handout for each medication shipped and additional FDA Medication Guides as required.  Verified that patient has previously received a Conservation officer, historic buildings.    All of the patient's questions and concerns have been addressed.    Tera Helper   [facility] Specialty Pharmacist

## 2019-12-25 MED FILL — LISINOPRIL 5 MG TABLET: 30 days supply | Qty: 60 | Fill #3 | Status: AC

## 2019-12-25 MED FILL — ATORVASTATIN 10 MG TABLET: 30 days supply | Qty: 30 | Fill #10 | Status: AC

## 2019-12-25 MED FILL — MYFORTIC 180 MG TABLET,DELAYED RELEASE: 30 days supply | Qty: 180 | Fill #3 | Status: AC

## 2019-12-25 MED FILL — MYFORTIC 180 MG TABLET,DELAYED RELEASE: ORAL | 30 days supply | Qty: 180 | Fill #3

## 2019-12-25 MED FILL — TACROLIMUS 1 MG CAPSULE, IMMEDIATE-RELEASE: ORAL | 30 days supply | Qty: 120 | Fill #9

## 2019-12-25 MED FILL — TACROLIMUS 1 MG CAPSULE, IMMEDIATE-RELEASE: 30 days supply | Qty: 120 | Fill #9 | Status: AC

## 2019-12-25 MED FILL — LISINOPRIL 5 MG TABLET: ORAL | 30 days supply | Qty: 60 | Fill #3

## 2019-12-25 MED FILL — ATORVASTATIN 10 MG TABLET: ORAL | 30 days supply | Qty: 30 | Fill #10

## 2019-12-30 ENCOUNTER — Ambulatory Visit: Admit: 2019-12-30 | Discharge: 2019-12-31 | Payer: MEDICARE | Attending: Nephrology | Primary: Nephrology

## 2019-12-30 DIAGNOSIS — Z940 Kidney transplant status: Principal | ICD-10-CM

## 2019-12-30 DIAGNOSIS — N1832 Stage 3b chronic kidney disease: Principal | ICD-10-CM

## 2019-12-30 DIAGNOSIS — R7989 Other specified abnormal findings of blood chemistry: Principal | ICD-10-CM

## 2019-12-30 DIAGNOSIS — N186 End stage renal disease: Principal | ICD-10-CM

## 2019-12-30 LAB — CBC W/ AUTO DIFF
BASOPHILS ABSOLUTE COUNT: 0.1 10*9/L (ref 0.0–0.1)
BASOPHILS RELATIVE PERCENT: 1.1 %
EOSINOPHILS ABSOLUTE COUNT: 0.1 10*9/L (ref 0.0–0.7)
EOSINOPHILS RELATIVE PERCENT: 1.8 %
HEMOGLOBIN: 14.1 g/dL (ref 13.5–17.5)
LYMPHOCYTES ABSOLUTE COUNT: 0.6 10*9/L — ABNORMAL LOW (ref 0.7–4.0)
LYMPHOCYTES RELATIVE PERCENT: 9.8 %
MEAN CORPUSCULAR HEMOGLOBIN CONC: 32.2 g/dL (ref 30.0–36.0)
MEAN CORPUSCULAR HEMOGLOBIN: 30.2 pg (ref 26.0–34.0)
MEAN CORPUSCULAR VOLUME: 93.8 fL (ref 81.0–95.0)
MEAN PLATELET VOLUME: 9.4 fL (ref 7.0–10.0)
MONOCYTES ABSOLUTE COUNT: 0.4 10*9/L (ref 0.1–1.0)
MONOCYTES RELATIVE PERCENT: 6 %
NEUTROPHILS ABSOLUTE COUNT: 4.9 10*9/L (ref 1.7–7.7)
NEUTROPHILS RELATIVE PERCENT: 81.3 %
NUCLEATED RED BLOOD CELLS: 0 /100{WBCs} (ref <=4)
PLATELET COUNT: 215 10*9/L (ref 150–450)
RED BLOOD CELL COUNT: 4.67 10*12/L (ref 4.32–5.72)
WBC ADJUSTED: 6 10*9/L (ref 3.5–10.5)

## 2019-12-30 LAB — LIPID PANEL
CHOLESTEROL/HDL RATIO SCREEN: 2.5 (ref 1.0–4.5)
CHOLESTEROL: 152 mg/dL (ref <=200)
HDL CHOLESTEROL: 61 mg/dL — ABNORMAL HIGH (ref 40–60)
NON-HDL CHOLESTEROL: 91 mg/dL (ref 70–130)
TRIGLYCERIDES: 51 mg/dL (ref 0–150)
VLDL CHOLESTEROL CAL: 10.2 mg/dL — ABNORMAL LOW (ref 12–42)

## 2019-12-30 LAB — HDL CHOLESTEROL: Cholesterol.in HDL:MCnc:Pt:Ser/Plas:Qn:: 61 — ABNORMAL HIGH

## 2019-12-30 LAB — URINALYSIS
BILIRUBIN UA: NEGATIVE
BLOOD UA: NEGATIVE
GLUCOSE UA: NEGATIVE
KETONES UA: NEGATIVE
LEUKOCYTE ESTERASE UA: NEGATIVE
NITRITE UA: NEGATIVE
PH UA: 5.5 (ref 5.0–9.0)
PROTEIN UA: NEGATIVE
RBC UA: <1 /HPF (ref <3)
SPECIFIC GRAVITY UA: 1.02 (ref 1.005–1.030)
SQUAMOUS EPITHELIAL: <1 /HPF (ref 0–5)
UROBILINOGEN UA: 0.2

## 2019-12-30 LAB — BASIC METABOLIC PANEL
ANION GAP: 4 mmol/L (ref 3–11)
BLOOD UREA NITROGEN: 23 mg/dL (ref 9–23)
CALCIUM: 10.3 mg/dL (ref 8.7–10.4)
CHLORIDE: 106 mmol/L (ref 98–107)
CO2: 25.3 mmol/L (ref 20.0–31.0)
CREATININE: 1.74 mg/dL — ABNORMAL HIGH (ref 0.60–1.10)
GLUCOSE RANDOM: 118 mg/dL — ABNORMAL HIGH (ref 70–99)
POTASSIUM: 5.4 mmol/L — ABNORMAL HIGH (ref 3.5–5.1)
SODIUM: 135 mmol/L (ref 135–145)

## 2019-12-30 LAB — PROTEIN UA: Protein:MCnc:Pt:Urine:Qn:Test strip: NEGATIVE

## 2019-12-30 LAB — ESTIMATED AVERAGE GLUCOSE: Estimated average glucose:MCnc:Pt:Bld:Qn:Estimated from glycated hemoglobin: 148

## 2019-12-30 LAB — RED BLOOD CELL COUNT: Lab: 4.67

## 2019-12-30 LAB — ANION GAP: Anion gap 3:SCnc:Pt:Ser/Plas:Qn:: 4

## 2019-12-30 LAB — CREATININE, URINE: Lab: 87.1

## 2019-12-30 LAB — PARATHYROID HORMONE INTACT: Parathyrin.intact:MCnc:Pt:Ser/Plas:Qn:: 97.1 — ABNORMAL HIGH

## 2019-12-30 LAB — MAGNESIUM: Magnesium:MCnc:Pt:Ser/Plas:Qn:: 1.8

## 2019-12-30 LAB — PARATHYROID HOMONE (PTH): PARATHYROID HORMONE INTACT: 97.1 pg/mL — ABNORMAL HIGH (ref 12.0–72.0)

## 2019-12-30 LAB — PROTEIN / CREATININE RATIO, URINE: CREATININE, URINE: 87.1 mg/dL

## 2019-12-30 LAB — TACROLIMUS, TROUGH: Lab: 6.6

## 2019-12-30 LAB — PHOSPHORUS: Phosphate:MCnc:Pt:Ser/Plas:Qn:: 2.6

## 2019-12-30 MED ORDER — LISINOPRIL 20 MG TABLET
ORAL_TABLET | Freq: Every evening | ORAL | 11 refills | 30.00000 days | Status: CP
Start: 2019-12-30 — End: 2020-12-29
  Filled 2020-01-19: qty 30, 30d supply, fill #0

## 2019-12-30 NOTE — Unmapped (Signed)
Saw patient in clinic with Dr Toni Arthurs with [facility] spanish interpreter.  Patient reports he is doing well.  Denies any specific concerns.    States when he bends over sometimes he feels dizzy.  Sometimes checks BP at [employer] and it is usually high.  Dr Toni Arthurs increased lisinopril to 20mg  at bedtime    Patient only drinking 2 1/2 bottles of water a day. Encouraged him to drink 5-6 16 oz bottles of water or 10 cups of fluids    Encouraged continued mask wearing especially in large crowds or while at work in [employer].    Denies CP, SOB, n/v/d, urinary problems, tremors, swelling, or fevers    Will have labs today and last took tac at 930pm

## 2019-12-30 NOTE — Unmapped (Signed)
AOBP:Right arm  medium cuff   Average:133/96 Pulse:80  1st reading:138/97 Pulse:81  2nd reading:130/92 Pulse:79  3rd reading:131/99 Pulse:81     Patient labs drawn in room prior to leaving Nephrology clinic.

## 2019-12-31 LAB — BK VIRUS QUANTITATIVE PCR, BLOOD: BK BLOOD RESULT: NOT DETECTED

## 2019-12-31 LAB — BK BLOOD COMMENT: Lab: 0

## 2020-01-01 LAB — VITAMIN D, TOTAL (25OH): Lab: 25.5

## 2020-01-02 LAB — EBV VIRAL LOAD RESULT: Lab: NOT DETECTED

## 2020-01-05 LAB — HLA DS POST TRANSPLANT
ANTI-DONOR DRW #1 MFI: 184 MFI
ANTI-DONOR DRW #2 MFI: 153 MFI
ANTI-DONOR HLA-A #1 MFI: 253 MFI
ANTI-DONOR HLA-A #2 MFI: 0 MFI
ANTI-DONOR HLA-B #1 MFI: 27 MFI
ANTI-DONOR HLA-B #2 MFI: 312 MFI
ANTI-DONOR HLA-C #1 MFI: 220 MFI
ANTI-DONOR HLA-C #2 MFI: 718 MFI
ANTI-DONOR HLA-DQB #1 MFI: 134 MFI
ANTI-DONOR HLA-DQB #2 MFI: 179 MFI
ANTI-DONOR HLA-DR #1 MFI: 114 MFI
ANTI-DONOR HLA-DR #2 MFI: 138 MFI

## 2020-01-05 LAB — FSAB CLASS 2 ANTIBODY SPECIFICITY: HLA CL2 AB RESULT: POSITIVE

## 2020-01-05 LAB — HLA CLASS 1 ANTIBODY RESULT: Lab: POSITIVE

## 2020-01-05 LAB — FSAB CLASS 1 ANTIBODY SPECIFICITY

## 2020-01-05 LAB — HLA CL2 AB RESULT: Lab: POSITIVE

## 2020-01-05 LAB — ANTI-DONOR HLA-DP AG #1 MFI: Lab: 268

## 2020-01-05 LAB — VITAMIN D 1,25-DIHYDROXY: 1,25-Dihydroxyvitamin D:MCnc:Pt:Ser/Plas:Qn:: 51

## 2020-01-05 LAB — VITAMIN D 1,25 DIHYDROXY: VITAMIN D 1,25-DIHYDROXY: 51 pg/mL

## 2020-01-09 LAB — CMV DNA, QUANTITATIVE, PCR

## 2020-01-09 LAB — CMV QUANT LOG10: Lab: 0

## 2020-01-13 NOTE — Unmapped (Addendum)
[facility] transplant nephrology clinic visit    assessment and plan  1. s/p kidney transplant 09/03/2017. baseline creatinine 1.6-2 mg/dl. no proteinuria. no donor specific hla ab detected. +incr fluid intake to 60-80oz daily encouraged.  2. immunosuppression. mycophenolate sodium 540mg  bid. tacrolimus 12hr lvl 5-7 ng/ml.   3. hypertension. lisinopril incr to 20mg  q.pm. blood pressure goal < 130/80 mmhg.  4. hyperlipidemia. atorvastatin 10mg  daily.  5. preventive medicine. influenza '20. pcv13 pneumococcal '20. ppsv23 pneumococcal '21. zoster '20. GNFAO13 vaccine '21. colonoscopy '14 with 5 year follow up recommended. kidney ultrasound '19 with annual follow up to be scheduled.    history of present illness    Justin Mcknight is a 64 year old gentleman seen in follow up post kidney transplant 09/03/2017. he is seen with assistance of [place] spanish interpreter. he feels well. +mild orthostatic lightheaded 1-2x/week. avg 40oz fluid intake daily. no fever chills or sweats. no chest pain cough or shortness of breath. no lower extremity edema. appetite nl. no abdominal pain no n/v/d. no myalgias or arthralgias. no dysuria hematuria or difficulty voiding. all other systems reviewed and negative x10 systems.    past medical hx:  1. 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 hypertension. alemtuzumab induction. baseline creatinine 1.6-2 mg/dl.  > kidney bx 03/19: no acute rejection. +mild focal thrombotic microangiopathy.  > kidney bx 05/19: no acute rejection. no thrombotic microangiopathy.  2. hypertension  3. hyperlipidemia  4. hx renal cell carcinoma s/p right nephrectomy '12.    past surgical hx: left forearm av fistula '12. right nephrectomy '12. kidney transplant '19. left forearm av fistula ligation '19.    allergies: codeine    medications: tacrolimus 2mg  bid, mycophenolate sodium 540mg  bid, lisinopril 10mg  q.pm, atorvastatin 10mg  daily.    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    physical exam: t96.6 p80 bp133/96 wt87.9kg bmi 26.3. wd/wn gentleman appropriate affect and mood. sclera anicteric. wearing mask. neck supple no palpable ln. heart rrr nl s1s2 no m/r/g. lungs clear bilateral. abd soft nt/nd. no lower ext edema. msk no synovitis or tophi. skin no rash. neuro alert oriented non focal exam.    labs 12/30/19: wbc6.0 hgb14.1 hct43.8 plts215. na135 k5.4 cl106 bicarb25 bun23 cr1.7 glc118 ca10.3 mg1.8 phos2.6. hgb a1c 6.8%. total cholesterol 152 tg 51 hdl 61 ldl 81. 25-vitamin d 25.5. pth 97. no donor specific hla ab detected. cmv/bkv pcr not detected. tacrolimus lvl 6.6 ng/ml. Urine protein not detected.

## 2020-01-18 NOTE — Unmapped (Signed)
[facility] Refill Coordination Note    Specialty Medication(s) to be Shipped:   Transplant: Myfortic 180mg  and tacrolimus 1mg     Other medication(s) to be shipped:   Atorvastatin 10mg   Lisinopril 20mg      [facility], DOB: August 03, 1955  Phone: [phone] (home)       All above HIPAA information was verified with patient.     Was a Nurse, learning disability used for this call? Yes, Spanish. Patient language is appropriate in [facility]    Completed refill call assessment today to schedule patient's medication shipment from the [facility] [phone]).       Specialty medication(s) and dose(s) confirmed: Regimen is correct and unchanged.   Changes to medications: Brittan reports no changes at this time.  Changes to insurance: No  Questions for the pharmacist: No    Confirmed patient received Welcome Packet with first shipment. The patient will receive a drug information handout for each medication shipped and additional FDA Medication Guides as required.       DISEASE/MEDICATION-SPECIFIC INFORMATION        N/A    SPECIALTY MEDICATION ADHERENCE     Medication Adherence    Patient reported X missed doses in the last month: 0  Specialty Medication: Myfortic 180mg   Patient is on additional specialty medications: Yes  Additional Specialty Medications: Tacrolimus 1mg   Patient Reported Additional Medication X Missed Doses in the Last Month: 0  Patient is on more than two specialty medications: No  Adherence tools used: patient uses a pill box to manage medications  Support network for adherence: family member                Myfortic 180 mg: 6 days of medicine on hand   Tacrolimus 1 mg: 6 days of medicine on hand         SHIPPING     Shipping address confirmed in Epic.     Delivery Scheduled: Yes, Expected medication delivery date: 01/19/20.     Medication will be delivered via Same Day Courier to the prescription address in Epic WAM.    Nancy Nordmann [facility] Specialty Technician

## 2020-01-19 MED FILL — LISINOPRIL 20 MG TABLET: 30 days supply | Qty: 30 | Fill #0 | Status: AC

## 2020-01-19 MED FILL — ATORVASTATIN 10 MG TABLET: ORAL | 30 days supply | Qty: 30 | Fill #11

## 2020-01-19 MED FILL — MYFORTIC 180 MG TABLET,DELAYED RELEASE: 30 days supply | Qty: 180 | Fill #4 | Status: AC

## 2020-01-19 MED FILL — TACROLIMUS 1 MG CAPSULE, IMMEDIATE-RELEASE: 30 days supply | Qty: 120 | Fill #10 | Status: AC

## 2020-01-19 MED FILL — ATORVASTATIN 10 MG TABLET: 30 days supply | Qty: 30 | Fill #11 | Status: AC

## 2020-01-19 MED FILL — TACROLIMUS 1 MG CAPSULE, IMMEDIATE-RELEASE: ORAL | 30 days supply | Qty: 120 | Fill #10

## 2020-01-19 MED FILL — MYFORTIC 180 MG TABLET,DELAYED RELEASE: ORAL | 30 days supply | Qty: 180 | Fill #4

## 2020-02-22 MED ORDER — ATORVASTATIN 10 MG TABLET
ORAL_TABLET | Freq: Every day | ORAL | 11 refills | 30 days | Status: CP
Start: 2020-02-22 — End: 2021-02-21
  Filled 2020-02-22: qty 30, 30d supply, fill #0

## 2020-02-22 MED FILL — MYFORTIC 180 MG TABLET,DELAYED RELEASE: ORAL | 30 days supply | Qty: 180 | Fill #5

## 2020-02-22 MED FILL — LISINOPRIL 20 MG TABLET: ORAL | 30 days supply | Qty: 30 | Fill #1

## 2020-02-22 MED FILL — TACROLIMUS 1 MG CAPSULE, IMMEDIATE-RELEASE: 30 days supply | Qty: 120 | Fill #11 | Status: AC

## 2020-02-22 MED FILL — TACROLIMUS 1 MG CAPSULE, IMMEDIATE-RELEASE: ORAL | 30 days supply | Qty: 120 | Fill #11

## 2020-02-22 MED FILL — LISINOPRIL 20 MG TABLET: 30 days supply | Qty: 30 | Fill #1 | Status: AC

## 2020-02-22 MED FILL — MYFORTIC 180 MG TABLET,DELAYED RELEASE: 30 days supply | Qty: 180 | Fill #5 | Status: AC

## 2020-02-22 MED FILL — ATORVASTATIN 10 MG TABLET: 30 days supply | Qty: 30 | Fill #0 | Status: AC

## 2020-02-22 NOTE — Unmapped (Signed)
[facility] Refill Coordination Note    Specialty Medication(s) to be Shipped:   Transplant: Myfortic 180mg  and tacrolimus 1mg     Other medication(s) to be shipped: lisinopril 20mg , atorvastatin 10mg      [facility], DOB: 1956/03/10  Phone: [phone] (home)       All above HIPAA information was verified with patient.     Was a Nurse, learning disability used for this call? No    Completed refill call assessment today to schedule patient's medication shipment from the [facility] [phone]).       Specialty medication(s) and dose(s) confirmed: Regimen is correct and unchanged.   Changes to medications: [place]olson reports no changes at this time.  Changes to insurance: No  Questions for the pharmacist: No    Confirmed patient received Welcome Packet with first shipment. The patient will receive a drug information handout for each medication shipped and additional FDA Medication Guides as required.       DISEASE/MEDICATION-SPECIFIC INFORMATION        N/A    SPECIALTY MEDICATION ADHERENCE     Medication Adherence    Patient reported X missed doses in the last month: 0  Specialty Medication: Myfortic 180mg   Patient is on additional specialty medications: Yes  Additional Specialty Medications: Tacrolimus 1mg   Patient Reported Additional Medication X Missed Doses in the Last Month: 0  Patient is on more than two specialty medications: No  Informant: patient  Adherence tools used: patient uses a pill box to manage medications  Support network for adherence: family member                Myfortic 180 mg: 7 days of medicine on hand   Tacrolimus 1 mg: 7 days of medicine on hand         SHIPPING     Shipping address confirmed in Epic.     Delivery Scheduled: Yes, Expected medication delivery date: 02/23/20.     Medication will be delivered via Next Day Courier to the prescription address in Epic WAM.    Jasper Loser   [facility] Specialty Technician

## 2020-03-07 ENCOUNTER — Other Ambulatory Visit: Payer: Self-pay

## 2020-03-07 ENCOUNTER — Encounter: Payer: Self-pay | Admitting: Ophthalmology

## 2020-03-08 ENCOUNTER — Other Ambulatory Visit
Admission: RE | Admit: 2020-03-08 | Discharge: 2020-03-08 | Disposition: A | Discharge status: Home / Self Care | Payer: Medicare Other | Source: Ambulatory Visit | Attending: Ophthalmology | Admitting: Ophthalmology

## 2020-03-08 DIAGNOSIS — Z01812 Encounter for preprocedural laboratory examination: Secondary | ICD-10-CM | POA: Diagnosis present

## 2020-03-08 DIAGNOSIS — Z20822 Contact with and (suspected) exposure to covid-19: Secondary | ICD-10-CM | POA: Insufficient documentation

## 2020-03-08 LAB — SARS CORONAVIRUS 2 (TAT 6-24 HRS): SARS Coronavirus 2: NEGATIVE

## 2020-03-08 NOTE — Discharge Instructions (Signed)

## 2020-03-10 ENCOUNTER — Encounter: Payer: Self-pay | Admitting: Ophthalmology

## 2020-03-10 ENCOUNTER — Encounter
Admission: RE | Disposition: A | Discharge status: Home / Self Care | Payer: Self-pay | Source: Home / Self Care | Attending: Ophthalmology

## 2020-03-10 ENCOUNTER — Ambulatory Visit
Admission: RE | Admit: 2020-03-10 | Discharge: 2020-03-10 | Disposition: A | Discharge status: Home / Self Care | Payer: Medicare Other | Attending: Ophthalmology | Admitting: Ophthalmology

## 2020-03-10 ENCOUNTER — Ambulatory Visit: Payer: Medicare Other | Admitting: Anesthesiology

## 2020-03-10 ENCOUNTER — Other Ambulatory Visit: Payer: Self-pay

## 2020-03-10 DIAGNOSIS — E7800 Pure hypercholesterolemia, unspecified: Secondary | ICD-10-CM | POA: Insufficient documentation

## 2020-03-10 DIAGNOSIS — I10 Essential (primary) hypertension: Secondary | ICD-10-CM | POA: Diagnosis not present

## 2020-03-10 DIAGNOSIS — Z940 Kidney transplant status: Secondary | ICD-10-CM | POA: Diagnosis not present

## 2020-03-10 DIAGNOSIS — H401122 Primary open-angle glaucoma, left eye, moderate stage: Secondary | ICD-10-CM | POA: Diagnosis not present

## 2020-03-10 DIAGNOSIS — Z87891 Personal history of nicotine dependence: Secondary | ICD-10-CM | POA: Insufficient documentation

## 2020-03-10 DIAGNOSIS — Z79899 Other long term (current) drug therapy: Secondary | ICD-10-CM | POA: Diagnosis not present

## 2020-03-10 DIAGNOSIS — H2512 Age-related nuclear cataract, left eye: Secondary | ICD-10-CM | POA: Insufficient documentation

## 2020-03-10 HISTORY — PX: CATARACT EXTRACTION W/PHACO: SHX586

## 2020-03-10 SURGERY — PHACOEMULSIFICATION, CATARACT, WITH IOL INSERTION
Anesthesia: Monitor Anesthesia Care | Site: Eye | Laterality: Left

## 2020-03-10 MED ORDER — BRIMONIDINE TARTRATE-TIMOLOL 0.2-0.5 % OP SOLN
OPHTHALMIC | Status: DC | PRN
  Administered 2020-03-10: 1 [drp] via OPHTHALMIC

## 2020-03-10 MED ORDER — MIDAZOLAM HCL 2 MG/2ML IJ SOLN
INTRAMUSCULAR | Status: DC | PRN
  Administered 2020-03-10: 1 mg via INTRAVENOUS

## 2020-03-10 MED ORDER — ARMC OPHTHALMIC DILATING DROPS
1.0000 "application " | OPHTHALMIC | Status: DC | PRN
  Administered 2020-03-10 (×3): 1 via OPHTHALMIC

## 2020-03-10 MED ORDER — TETRACAINE HCL 0.5 % OP SOLN
1.0000 [drp] | OPHTHALMIC | Status: DC | PRN
  Administered 2020-03-10 (×3): 1 [drp] via OPHTHALMIC

## 2020-03-10 MED ORDER — NA CHONDROIT SULF-NA HYALURON 40-17 MG/ML IO SOLN
INTRAOCULAR | Status: DC | PRN
  Administered 2020-03-10: 1 mL via INTRAOCULAR

## 2020-03-10 MED ORDER — LIDOCAINE HCL (PF) 2 % IJ SOLN
INTRAOCULAR | Status: DC | PRN
  Administered 2020-03-10: 2 mL via INTRAOCULAR

## 2020-03-10 MED ORDER — TRYPAN BLUE 0.06 % OP SOLN
OPHTHALMIC | Status: DC | PRN
  Administered 2020-03-10: 0.5 mL via INTRAOCULAR

## 2020-03-10 MED ORDER — FENTANYL CITRATE (PF) 100 MCG/2ML IJ SOLN
INTRAMUSCULAR | Status: DC | PRN
Start: 2020-03-10 — End: 2020-03-10
  Administered 2020-03-10: 50 ug via INTRAVENOUS

## 2020-03-10 MED ORDER — PROVISC 10 MG/ML IO SOLN
INTRAOCULAR | Status: DC | PRN
  Administered 2020-03-10: 0.55 mL via INTRAOCULAR

## 2020-03-10 MED ORDER — EPINEPHRINE PF 1 MG/ML IJ SOLN
INTRAOCULAR | Status: DC | PRN
  Administered 2020-03-10: 67 mL via OPHTHALMIC

## 2020-03-10 MED ORDER — TETRACAINE 0.5 % OP SOLN OPTIME - NO CHARGE
OPHTHALMIC | Status: DC | PRN
  Administered 2020-03-10: 2 [drp] via OPHTHALMIC

## 2020-03-10 MED ORDER — MOXIFLOXACIN HCL 0.5 % OP SOLN
OPHTHALMIC | Status: DC | PRN
  Administered 2020-03-10: 0.2 mL via OPHTHALMIC

## 2020-03-10 SURGICAL SUPPLY — 24 items
DEVICE INJECT ISTENT W (Stent) IMPLANT
DISSECTOR HYDRO NUCLEUS 50X22 (MISCELLANEOUS) ×12 IMPLANT
DRSG TEGADERM 2-3/8X2-3/4 SM (GAUZE/BANDAGES/DRESSINGS) ×3 IMPLANT
GLOVE BIOGEL PI IND STRL 8 (GLOVE) ×1 IMPLANT
GLOVE BIOGEL PI INDICATOR 8 (GLOVE) ×2
GONIO PRISM W/BUILT IN CLIP (KITS) ×3
GONIOPRISM W/BUILT IN CLIP (KITS) IMPLANT
GOWN STRL REUS W/ TWL LRG LVL3 (GOWN DISPOSABLE) ×1 IMPLANT
GOWN STRL REUS W/ TWL XL LVL3 (GOWN DISPOSABLE) ×1 IMPLANT
GOWN STRL REUS W/TWL LRG LVL3 (GOWN DISPOSABLE) ×3
GOWN STRL REUS W/TWL XL LVL3 (GOWN DISPOSABLE) ×3
INJECT ISTENT W (Stent) ×3 IMPLANT
KNIFE 45D UP 2.3 (MISCELLANEOUS) ×3 IMPLANT
LENS IOL DIOP 22.5 (Intraocular Lens) ×3 IMPLANT
LENS IOL TECNIS MONO 22.5 (Intraocular Lens) IMPLANT
MARKER SKIN DUAL TIP RULER LAB (MISCELLANEOUS) ×3 IMPLANT
NDL CAPSULORHEX 25GA (NEEDLE) ×1 IMPLANT
NEEDLE CAPSULORHEX 25GA (NEEDLE) ×3 IMPLANT
PACK CATARACT (MISCELLANEOUS) ×3 IMPLANT
PACK DR. KING ARMS (PACKS) ×3 IMPLANT
PACK EYE AFTER SURG (MISCELLANEOUS) ×3 IMPLANT
SOLUTION OPHTHALMIC SALT (MISCELLANEOUS) ×3 IMPLANT
WATER STERILE IRR 250ML POUR (IV SOLUTION) ×3 IMPLANT
WIPE NON LINTING 3.25X3.25 (MISCELLANEOUS) ×3 IMPLANT

## 2020-03-10 NOTE — H&P (Signed)
   I have reviewed the patient's H&P and agree with its findings. There have been no interval changes.  Leven Hoel MD Ophthalmology 

## 2020-03-10 NOTE — Transfer of Care (Signed)
Immediate Anesthesia Transfer of Care Note  Patient: Christopher Hickman  Procedure(s) Performed: CATARACT EXTRACTION PHACO AND INTRAOCULAR LENS PLACEMENT (IOC) LEFT ISTENT INJ W VISION BLUE 7.56 00:39.0 (Left Eye)  Patient Location: PACU  Anesthesia Type: MAC  Level of Consciousness: awake, alert  and patient cooperative  Airway and Oxygen Therapy: Patient Spontanous Breathing and Patient connected to supplemental oxygen  Post-op Assessment: Post-op Vital signs reviewed, Patient's Cardiovascular Status Stable, Respiratory Function Stable, Patent Airway and No signs of Nausea or vomiting  Post-op Vital Signs: Reviewed and stable  Complications: No complications documented.

## 2020-03-10 NOTE — Anesthesia Procedure Notes (Signed)
Procedure Name: MAC Date/Time: 03/10/2020 11:56 AM Performed by: Cameron Ali, CRNA Pre-anesthesia Checklist: Patient identified, Emergency Drugs available, Suction available, Timeout performed and Patient being monitored Patient Re-evaluated:Patient Re-evaluated prior to induction Oxygen Delivery Method: Nasal cannula Placement Confirmation: positive ETCO2

## 2020-03-10 NOTE — Op Note (Signed)
  PREOPERATIVE DIAGNOSIS:  Nuclear sclerotic cataract of the LEFT eye and mild/moderate POAG   POSTOPERATIVE DIAGNOSIS:  Nuclear sclerotic cataract of the LEFT eye and mild/moderate POAG   OPERATIVE PROCEDURE: Cataract surgery OS + iStent Insertion OS   SURGEON:  Marchia Meiers, MD.   ANESTHESIA:  Anesthesiologist: Page, Adele Barthel, MD CRNA: Cameron Ali, CRNA  1.      Managed anesthesia care. 2.     0.80ml of Shugarcaine was instilled following the paracentesis   COMPLICATIONS:  Anterior capsular tear not extending posteriorly   TECHNIQUE:   Divide and conquer   DESCRIPTION OF PROCEDURE:  The patient was examined and consented in the preoperative holding area where the aforementioned topical anesthesia was applied to the LEFT eye and then brought back to the Operating Room where the left eye was prepped and draped in the usual sterile ophthalmic fashion and a lid speculum was placed. A paracentesis was created with the side port blade, the anterior chamber was washed out with trypan blue to stain the anterior capsule, and the anterior chamber was filled with viscoelastic. A near clear corneal incision was performed with the steel keratome.   At this point, the procedure for iStent insertion was completed.  The patient's head was rotated 15 degrees, and the scope rotated about 30 degrees. The trabecular meshwork was visualized with the assistance of a prism placed on the cornea.  An iStent was inserted at the 10 o'clock position (superonasally), and a second istent was inserted at the 8 o'clock position (inferonasally) without complication.  The patient and scope were then repositioned.  A continuous curvilinear capsulorrhexis was performed with a cystotome followed by the capsulorrhexis forceps. Hydrodissection and hydrodelineation were carried out with BSS on a blunt cannula. During hydrodissection, the anterior capsule slit open without extending posteriorly.  The lens was removed in a  divide and conquer  technique and the remaining cortical material was removed with the irrigation-aspiration handpiece. The capsular bag was inflated with viscoelastic and the lens was placed in the capsular bag without complication. The remaining viscoelastic was removed from the eye with the irrigation-aspiration handpiece. The wounds were hydrated. The anterior chamber was flushed and the eye was inflated to physiologic pressure. 0.22ml Vigamox was placed in the anterior chamber. The wounds were found to be water tight. The eye was dressed with Vigamox. The patient was given protective glasses to wear throughout the day and a shield with which to sleep tonight. The patient was also given drops with which to begin a drop regimen today and will follow-up with me in one day. Implant Name Type Inv. Item Serial No. Manufacturer Lot No. LRB No. Used Action  LENS IOL DIOP 22.5 - [id number] Intraocular Lens LENS IOL DIOP 22.5 [id number] [facility] [employer]  Left 1 Implanted  Marko Stai - [id number] Stent Marko Stai [id number] [employer]  Left 1 Implanted    Procedure(s): CATARACT EXTRACTION PHACO AND INTRAOCULAR LENS PLACEMENT (IOC) LEFT ISTENT INJ W VISION BLUE 7.56 00:39.0 (Left)  Electronically signed: Marchia Meiers 03/10/2020 12:41 PM

## 2020-03-10 NOTE — Anesthesia Preprocedure Evaluation (Signed)
Anesthesia Evaluation  Patient identified by MRN, date of birth, ID band Patient awake    History of Anesthesia Complications Negative for: history of anesthetic complications  Airway Mallampati: II  TM Distance: >3 FB Neck ROM: Full    Dental no notable dental hx.    Pulmonary neg pulmonary ROS, former smoker,    Pulmonary exam normal        Cardiovascular hypertension, Pt. on medications      Neuro/Psych    GI/Hepatic negative GI ROS, Neg liver ROS,   Endo/Other  negative endocrine ROS  Renal/GU Renal disease (s/p prior renal transplant)     Musculoskeletal   Abdominal   Peds  Hematology negative hematology ROS (+)   Anesthesia Other Findings   Reproductive/Obstetrics                             Anesthesia Physical Anesthesia Plan  ASA: III  Anesthesia Plan: MAC   Post-op Pain Management:    Induction: Intravenous  PONV Risk Score and Plan: 1 and Midazolam, TIVA and Treatment may vary due to age or medical condition  Airway Management Planned: Nasal Cannula and Natural Airway  Additional Equipment: None  Intra-op Plan:   Post-operative Plan:   Informed Consent: I have reviewed the patients History and Physical, chart, labs and discussed the procedure including the risks, benefits and alternatives for the proposed anesthesia with the patient or authorized representative who has indicated his/her understanding and acceptance.       Plan Discussed with: CRNA  Anesthesia Plan Comments: (Interpreter [phone])        Anesthesia Quick Evaluation

## 2020-03-10 NOTE — Anesthesia Postprocedure Evaluation (Signed)
Anesthesia Post Note  Patient: Christopher Hickman  Procedure(s) Performed: CATARACT EXTRACTION PHACO AND INTRAOCULAR LENS PLACEMENT (IOC) LEFT ISTENT INJ W VISION BLUE 7.56 00:39.0 (Left Eye)     Patient location during evaluation: PACU Anesthesia Type: MAC Level of consciousness: awake and alert Pain management: pain level controlled Vital Signs Assessment: post-procedure vital signs reviewed and stable Respiratory status: spontaneous breathing Cardiovascular status: blood pressure returned to baseline Postop Assessment: no apparent nausea or vomiting, adequate PO intake and no headache Anesthetic complications: no   No complications documented.  Adele Barthel Loida Calamia

## 2020-03-11 ENCOUNTER — Encounter: Payer: Self-pay | Admitting: Ophthalmology

## 2020-03-21 DIAGNOSIS — Z940 Kidney transplant status: Principal | ICD-10-CM

## 2020-03-21 MED ORDER — TACROLIMUS 1 MG CAPSULE, IMMEDIATE-RELEASE
ORAL_CAPSULE | Freq: Two times a day (BID) | ORAL | 11 refills | 30 days | Status: CP
Start: 2020-03-21 — End: ?
  Filled 2020-03-23: qty 120, 30d supply, fill #0

## 2020-03-21 NOTE — Unmapped (Signed)
[facility] Refill Coordination Note    Specialty Medication(s) to be Shipped:   Transplant: Myfortic 180mg  and tacrolimus 1mg     Other medication(s) to be shipped: lisinopril 20mg , atorvastatin 10mg      [facility], DOB: 12/04/1955  Phone: [phone] (home)       All above HIPAA information was verified with patient.     Was a Nurse, learning disability used for this call? No    Completed refill call assessment today to schedule patient's medication shipment from the [facility] [phone]).       Specialty medication(s) and dose(s) confirmed: Regimen is correct and unchanged.   Changes to medications: [place] reports no changes at this time.  Changes to insurance: No  Questions for the pharmacist: No    Confirmed patient received Welcome Packet with first shipment. The patient will receive a drug information handout for each medication shipped and additional FDA Medication Guides as required.       DISEASE/MEDICATION-SPECIFIC INFORMATION        N/A    SPECIALTY MEDICATION ADHERENCE     Medication Adherence    Patient reported X missed doses in the last month: 0  Specialty Medication: Myfortic 180 mg  Patient is on additional specialty medications: Yes  Additional Specialty Medications: Tacrolimus 1 mg  Patient Reported Additional Medication X Missed Doses in the Last Month: 0  Patient is on more than two specialty medications: No  Any gaps in refill history greater than 2 weeks in the last 3 months: no  Demonstrates understanding of importance of adherence: yes  Informant: patient  Reliability of informant: reliable  Adherence tools used: patient uses a pill box to manage medications  Support network for adherence: family member  Confirmed plan for next specialty medication refill: delivery by pharmacy  Refills needed for supportive medications: not needed                Myfortic 180 mg: 7 days of medicine on hand   Tacrolimus 1 mg: 7 days of medicine on hand         SHIPPING Shipping address confirmed in Epic.     Delivery Scheduled: Yes, Expected medication delivery date: 03/24/2020.  However, Rx request for refills was sent to the provider as there are none remaining.     Medication will be delivered via Next Day Courier to the prescription address in Epic WAM.    Harnoor Kohles D Gary Gabrielsen   [facility] Specialty Technician

## 2020-03-23 MED FILL — TACROLIMUS 1 MG CAPSULE, IMMEDIATE-RELEASE: 30 days supply | Qty: 120 | Fill #0 | Status: AC

## 2020-03-23 MED FILL — LISINOPRIL 20 MG TABLET: ORAL | 30 days supply | Qty: 30 | Fill #2

## 2020-03-23 MED FILL — ATORVASTATIN 10 MG TABLET: ORAL | 30 days supply | Qty: 30 | Fill #1

## 2020-03-23 MED FILL — LISINOPRIL 20 MG TABLET: 30 days supply | Qty: 30 | Fill #2 | Status: AC

## 2020-03-23 MED FILL — MYFORTIC 180 MG TABLET,DELAYED RELEASE: 30 days supply | Qty: 180 | Fill #6 | Status: AC

## 2020-03-23 MED FILL — MYFORTIC 180 MG TABLET,DELAYED RELEASE: ORAL | 30 days supply | Qty: 180 | Fill #6

## 2020-03-23 MED FILL — ATORVASTATIN 10 MG TABLET: 30 days supply | Qty: 30 | Fill #1 | Status: AC

## 2020-04-08 ENCOUNTER — Encounter: Payer: Self-pay | Admitting: Ophthalmology

## 2020-04-08 ENCOUNTER — Other Ambulatory Visit: Payer: Self-pay

## 2020-04-12 ENCOUNTER — Other Ambulatory Visit: Payer: Self-pay

## 2020-04-12 ENCOUNTER — Other Ambulatory Visit
Admission: RE | Admit: 2020-04-12 | Discharge: 2020-04-12 | Disposition: A | Discharge status: Home / Self Care | Payer: Medicare Other | Source: Ambulatory Visit | Attending: Ophthalmology | Admitting: Ophthalmology

## 2020-04-12 DIAGNOSIS — Z20822 Contact with and (suspected) exposure to covid-19: Secondary | ICD-10-CM | POA: Insufficient documentation

## 2020-04-12 DIAGNOSIS — Z01812 Encounter for preprocedural laboratory examination: Secondary | ICD-10-CM | POA: Diagnosis present

## 2020-04-12 LAB — SARS CORONAVIRUS 2 (TAT 6-24 HRS): SARS Coronavirus 2: NEGATIVE

## 2020-04-12 NOTE — Discharge Instructions (Signed)
Ciruga de cataratas, cuidados posteriores Cataract Surgery, Care After Esta hoja le brinda informacin sobre cmo cuidarse despus del procedimiento. El mdico tambin podr darle indicaciones ms especficas. Comunquese con el mdico si tiene problemas o preguntas. Qu puedo esperar despus del procedimiento? Despus del procedimiento, es comn [employer] siguientes sntomas:  Picazn.  Molestias.  Secrecin lquida.  Sensibilidad a la luz y al tacto.  Moretones en el ojo o a su alrededor.  Leve visin borrosa. Sigue estas indicaciones en tu casa: Cuidado de los ojos   No se toque ni se frote los ojos.  Protjase los ojos como se lo haya indicado el mdico. Tal vez le indiquen que use una proteccin ocular resistente o gafas de sol.  No use lentes de contacto en el ojo o los ojos afectados hasta que el mdico lo autorice.  Mantenga la zona que rodea el ojo limpia y seca: ? Paramedic. ? No permita que el agua le caiga directamente en el rostro cuando se ducha. ? Evite que el jabn o el champ entren en sus ojos.  Revsese el ojo todos los das para detectar signos de infeccin. Est atento a lo siguiente: ? Enrojecimiento, hinchazn o dolor. ? Lquido, sangre o pus. ? Calor. ? [employer]. ? Visin que empeora. ? Sensibilidad que empeora. Actividad  No conduzca durante 24horas si le administraron un sedante durante el procedimiento.  Evite las actividades extenuantes, Chartered certified accountant deportes de Diplomatic Services operational officer, durante el tiempo que le haya indicado el mdico.  No conduzca ni opere maquinaria pesada hasta que el mdico lo autorice.  No se incline ni levante objetos pesados. Al agacharse, aumenta la presin en los ojos. Puede caminar, subir escaleras y realizar trabajos domsticos livianos.  Pregunte a su mdico cundo podr volver al [place]. Si trabaja en un ambiente con polvo, podrn indicarle que use una proteccin ocular durante cierto tiempo. Indicaciones  generales  Tome o aplquese los medicamentos de venta libre y los recetados solamente como se lo haya indicado el mdico. Esto incluye las gotas oftlmicas.  Concurra a todas las visitas de seguimiento como se lo haya indicado el mdico. Esto es importante. Comunquese con un mdico si:  Observa un aumento del hematoma alrededor del ojo.  Siente dolor que no se [employer].  Christopher Hickman.  Presenta enrojecimiento, hinchazn o dolor en el ojo.  Observa lquido, sangre o pus que emanan de la incisin.  La visin empeora.  La sensibilidad a la Higher education careers adviser. Solicite ayuda inmediatamente si:  Pierde la visin repentinamente.  Ve destellos de luz o manchas (moscas volantes).  Siente dolor intenso en el ojo.  Siente nuseas o vmitos. Resumen  Despus del procedimiento, es habitual tener picazn, Ursa, moretones, secrecin de lquido o sensibilidad a Naval architect.  Siga las instrucciones del mdico acerca de los cuidados para el ojo despus del procedimiento.  No se frote el ojo despus del procedimiento. Puede ser necesario que use proteccin para los ojos o gafas de sol. No use lentes de contacto. Mantenga el rea que rodea el ojo limpia y [place].  Evite actividades que requieran mucho esfuerzo. Estas incluyen practicar deportes y levantar objetos pesados.  Comunquese con un mdico si [employer], el dolor no desaparece o tiene fiebre. Solicite ayuda de inmediato si de repente pierde la visin, ve destellos de luz o manchas o tiene un dolor intenso en el ojo. Esta informacin no tiene Marine scientist el consejo del mdico. Asegrese de hacerle al mdico cualquier  pregunta que tenga. Document Revised: 02/04/2018 Document Reviewed: 02/04/2018 Elsevier Patient Education  [place] general en adultos, cuidados posteriores General Anesthesia, Adult, Care After Lea esta informacin sobre cmo cuidarse despus del procedimiento. El  mdico tambin podr darle instrucciones ms especficas. Comunquese con su mdico si tiene problemas o preguntas. Qu puedo esperar despus del procedimiento? Luego del procedimiento, son comunes los siguiente efectos secundarios:  Dolor o Scientist, research (life sciences) en el lugar de la va intravenosa (i.v.).  Nuseas.  Vmitos.  Dolor de Investment banker, operational.  Dificultad para concentrarse.  Sentir fro o [employer].  Debilidad o cansancio.  Somnolencia y Programmer, applications.  Malestar y Hydrologist. Estos efectos secundarios pueden afectar partes del cuerpo que no estuvieron involucradas en la ciruga. Siga estas indicaciones en su casa:  Durante al menos 24horas despus del procedimiento:  Pdale a un adulto responsable que permanezca con usted. Es importante que alguien cuide de usted hasta que se despierte y Cabin crew.  Descanse todo lo que sea necesario.  No haga lo siguiente: ? Participar en actividades en las que podra caerse o lastimarse. ? Conducir. ? Operar maquinarias pesadas. ? Beber alcohol. ? Tomar somnferos o medicamentos que causen somnolencia. ? Firmar documentos legales ni tomar [employer]. ? Cuidar a nios por su cuenta. Qu debe comer y beber  Siga las indicaciones del mdico respecto de las restricciones de comidas o bebidas.  Cuando [employer], comience a comer cantidades pequeas de alimentos que sean blandos y fciles de Publishing copy (livianos), como una tostada. Retome su dieta habitual de forma gradual.  Beba suficiente lquido como para mantener la orina de color amarillo plido.  Si vomita, rehidrtese tomando agua, jugo o caldo transparente. Instrucciones generales  Si tiene apnea del sueo, la [place] y ciertos medicamentos pueden aumentar el riesgo de problemas respiratorios. Siga las indicaciones del mdico respecto al uso de su dispositivo para dormir: ? Siempre que duerma, incluso durante las siestas que tome en el da. ? Mientras tome analgsicos  recetados, medicamentos para dormir o medicamentos que producen somnolencia.  Reanude sus actividades normales segn lo indicado por el mdico. Pregntele al mdico qu actividades son seguras para usted.  Tome los medicamentos de venta libre y los recetados solamente como se lo haya indicado el mdico.  Si fuma, no lo haga sin supervisin.  Concurra a todas las visitas de seguimiento como se lo haya indicado el mdico. Esto es importante. Comunquese con un mdico si:  Tiene nuseas o vmitos que no mejoran con medicamentos.  No puede comer ni beber sin vomitar.  El dolor no se alivia con medicamentos.  No puede orinar.  Tiene una erupcin cutnea.  Tiene fiebre.  Presenta enrojecimiento alrededor del lugar de la va intravenosa (i.v.) que empeora. Solicite ayuda de inmediato si:  Tiene dificultad para respirar.  Siente dolor en el pecho.  Observa sangre en la orina o heces, o vomita sangre. Resumen  Despus del procedimiento, es comn tener dolor de garganta y nuseas. Tambin es comn sentirse cansado.  Pdale a un adulto responsable que permanezca con usted durante 24 horas despus de la anestesia general. Es importante que alguien cuide de usted hasta que se despierte y Cabin crew.  Cuando [employer], comience a comer cantidades pequeas de alimentos que sean blandos y fciles de Publishing copy (livianos), como una tostada. Retome su dieta habitual de forma gradual.  Beba suficiente lquido como para mantener la orina de color amarillo plido.  Reanude sus actividades normales segn lo indicado  por el mdico. Pregntele al mdico qu actividades son seguras para usted. Esta informacin no tiene Marine scientist el consejo del mdico. Asegrese de hacerle al mdico cualquier pregunta que tenga. Document Revised: 04/22/2017 Document Reviewed: 04/22/2017 Elsevier Patient Education  2020 [employer].

## 2020-04-14 ENCOUNTER — Ambulatory Visit
Admission: RE | Admit: 2020-04-14 | Discharge: 2020-04-14 | Disposition: A | Discharge status: Home / Self Care | Payer: Medicare Other | Attending: Ophthalmology | Admitting: Ophthalmology

## 2020-04-14 ENCOUNTER — Other Ambulatory Visit: Payer: Self-pay

## 2020-04-14 ENCOUNTER — Ambulatory Visit: Payer: Medicare Other | Admitting: Anesthesiology

## 2020-04-14 ENCOUNTER — Encounter
Admission: RE | Disposition: A | Discharge status: Home / Self Care | Payer: Self-pay | Source: Home / Self Care | Attending: Ophthalmology

## 2020-04-14 ENCOUNTER — Encounter: Payer: Self-pay | Admitting: Ophthalmology

## 2020-04-14 DIAGNOSIS — I10 Essential (primary) hypertension: Secondary | ICD-10-CM | POA: Insufficient documentation

## 2020-04-14 DIAGNOSIS — H2511 Age-related nuclear cataract, right eye: Secondary | ICD-10-CM | POA: Insufficient documentation

## 2020-04-14 DIAGNOSIS — Z79899 Other long term (current) drug therapy: Secondary | ICD-10-CM | POA: Diagnosis not present

## 2020-04-14 DIAGNOSIS — Z9842 Cataract extraction status, left eye: Secondary | ICD-10-CM | POA: Diagnosis not present

## 2020-04-14 DIAGNOSIS — N189 Chronic kidney disease, unspecified: Secondary | ICD-10-CM | POA: Insufficient documentation

## 2020-04-14 DIAGNOSIS — Z87891 Personal history of nicotine dependence: Secondary | ICD-10-CM | POA: Diagnosis not present

## 2020-04-14 DIAGNOSIS — I129 Hypertensive chronic kidney disease with stage 1 through stage 4 chronic kidney disease, or unspecified chronic kidney disease: Secondary | ICD-10-CM | POA: Insufficient documentation

## 2020-04-14 DIAGNOSIS — Z940 Kidney transplant status: Secondary | ICD-10-CM | POA: Diagnosis not present

## 2020-04-14 DIAGNOSIS — E7800 Pure hypercholesterolemia, unspecified: Secondary | ICD-10-CM | POA: Diagnosis not present

## 2020-04-14 DIAGNOSIS — Z885 Allergy status to narcotic agent status: Secondary | ICD-10-CM | POA: Diagnosis not present

## 2020-04-14 HISTORY — DX: Pure hypercholesterolemia, unspecified: E78.00

## 2020-04-14 HISTORY — PX: CATARACT EXTRACTION W/PHACO: SHX586

## 2020-04-14 SURGERY — PHACOEMULSIFICATION, CATARACT, WITH IOL INSERTION
Anesthesia: Monitor Anesthesia Care | Site: Eye | Laterality: Right

## 2020-04-14 MED ORDER — TETRACAINE HCL 0.5 % OP SOLN
1.0000 [drp] | OPHTHALMIC | Status: DC | PRN
  Administered 2020-04-14 (×3): 1 [drp] via OPHTHALMIC

## 2020-04-14 MED ORDER — MOXIFLOXACIN HCL 0.5 % OP SOLN
OPHTHALMIC | Status: DC | PRN
  Administered 2020-04-14: 0.2 mL via OPHTHALMIC

## 2020-04-14 MED ORDER — NA CHONDROIT SULF-NA HYALURON 40-17 MG/ML IO SOLN
INTRAOCULAR | Status: DC | PRN
  Administered 2020-04-14 (×2): 1 mL via INTRAOCULAR

## 2020-04-14 MED ORDER — FENTANYL CITRATE (PF) 100 MCG/2ML IJ SOLN
INTRAMUSCULAR | Status: DC | PRN
Start: 2020-04-14 — End: 2020-04-14
  Administered 2020-04-14 (×2): 50 ug via INTRAVENOUS

## 2020-04-14 MED ORDER — TRYPAN BLUE 0.06 % OP SOLN
OPHTHALMIC | Status: DC | PRN
  Administered 2020-04-14: 0.5 mL via INTRAOCULAR

## 2020-04-14 MED ORDER — ARMC OPHTHALMIC DILATING DROPS
1.0000 "application " | OPHTHALMIC | Status: DC | PRN
  Administered 2020-04-14 (×3): 1 via OPHTHALMIC

## 2020-04-14 MED ORDER — PROVISC 10 MG/ML IO SOLN
INTRAOCULAR | Status: DC | PRN
  Administered 2020-04-14: 0.55 mL via INTRAOCULAR

## 2020-04-14 MED ORDER — EPINEPHRINE PF 1 MG/ML IJ SOLN
INTRAOCULAR | Status: DC | PRN
  Administered 2020-04-14: 70 mL via OPHTHALMIC

## 2020-04-14 MED ORDER — LIDOCAINE HCL (PF) 2 % IJ SOLN
INTRAOCULAR | Status: DC | PRN
  Administered 2020-04-14: 1 mL via INTRAOCULAR

## 2020-04-14 MED ORDER — TETRACAINE 0.5 % OP SOLN OPTIME - NO CHARGE
OPHTHALMIC | Status: DC | PRN
  Administered 2020-04-14: 2 [drp] via OPHTHALMIC

## 2020-04-14 MED ORDER — MIDAZOLAM HCL 2 MG/2ML IJ SOLN
INTRAMUSCULAR | Status: DC | PRN
  Administered 2020-04-14: 1.5 mg via INTRAVENOUS
  Administered 2020-04-14: .5 mg via INTRAVENOUS

## 2020-04-14 MED ORDER — BRIMONIDINE TARTRATE-TIMOLOL 0.2-0.5 % OP SOLN
OPHTHALMIC | Status: DC | PRN
  Administered 2020-04-14: 1 [drp] via OPHTHALMIC

## 2020-04-14 SURGICAL SUPPLY — 24 items
DEVICE INJECT ISTENT W (Stent) IMPLANT
DISSECTOR HYDRO NUCLEUS 50X22 (MISCELLANEOUS) ×8 IMPLANT
DRSG TEGADERM 2-3/8X2-3/4 SM (GAUZE/BANDAGES/DRESSINGS) ×2 IMPLANT
GLOVE BIOGEL PI IND STRL 8 (GLOVE) ×1 IMPLANT
GLOVE BIOGEL PI INDICATOR 8 (GLOVE) ×1
GONIO PRISM W/BUILT IN CLIP (KITS) ×2
GONIOPRISM W/BUILT IN CLIP (KITS) IMPLANT
GOWN STRL REUS W/ TWL LRG LVL3 (GOWN DISPOSABLE) ×1 IMPLANT
GOWN STRL REUS W/ TWL XL LVL3 (GOWN DISPOSABLE) ×1 IMPLANT
GOWN STRL REUS W/TWL LRG LVL3 (GOWN DISPOSABLE) ×2
GOWN STRL REUS W/TWL XL LVL3 (GOWN DISPOSABLE) ×2
INJECT ISTENT W (Stent) ×2 IMPLANT
KNIFE 45D UP 2.3 (MISCELLANEOUS) ×2 IMPLANT
LENS IOL DIOP 22.5 (Intraocular Lens) ×2 IMPLANT
LENS IOL TECNIS MONO 22.5 (Intraocular Lens) IMPLANT
MARKER SKIN DUAL TIP RULER LAB (MISCELLANEOUS) ×2 IMPLANT
NDL CAPSULORHEX 25GA (NEEDLE) ×1 IMPLANT
NEEDLE CAPSULORHEX 25GA (NEEDLE) ×2 IMPLANT
PACK CATARACT (MISCELLANEOUS) ×2 IMPLANT
PACK DR. KING ARMS (PACKS) ×2 IMPLANT
PACK EYE AFTER SURG (MISCELLANEOUS) ×2 IMPLANT
SOLUTION OPHTHALMIC SALT (MISCELLANEOUS) ×2 IMPLANT
WATER STERILE IRR 250ML POUR (IV SOLUTION) ×2 IMPLANT
WIPE NON LINTING 3.25X3.25 (MISCELLANEOUS) ×2 IMPLANT

## 2020-04-14 NOTE — Op Note (Signed)
  PREOPERATIVE DIAGNOSIS:  Nuclear sclerotic cataract of the RIGHT eye and moderate POAG   POSTOPERATIVE DIAGNOSIS:  Nuclear sclerotic cataract of the RIGHT eye and moderate POAG   OPERATIVE PROCEDURE: Cataract surgery OD and iStent placement OD   SURGEON:  Marchia Meiers, MD.   ANESTHESIA:  Anesthesiologist: Page, Adele Barthel, MD CRNA: Mayme Genta, CRNA  1.      Managed anesthesia care. 2.     0.36ml of Shugarcaine was instilled following the paracentesis   COMPLICATIONS:  None.   TECHNIQUE:   Divide and conquer   DESCRIPTION OF PROCEDURE:  The patient was examined and consented in the preoperative holding area where the aforementioned topical anesthesia was applied to the RIGHT eye and then brought back to the Operating Room where the RIGHT eye was prepped and draped in the usual sterile ophthalmic fashion and a lid speculum was placed. A paracentesis was created with the side port blade, the anterior chamber was washed out with trypan blue to stain the anterior capsule, and the anterior chamber was filled with viscoelastic. A near clear corneal incision was performed with the steel keratome.   At this point, the procedure for iStent insertion was completed. The patient's head was rotated 15 degrees, and the scope rotated about 30 degrees. The trabecular meshwork was visualized with the assistance of a prism placed on the cornea. An iStent was inserted at the 10 o'clock position (superonasally), and a second istent was inserted at the 8 o'clock position (inferonasally) without complication. The patient and scope were then repositioned.   A continuous curvilinear capsulorrhexis was performed with a cystotome followed by the capsulorrhexis forceps. Hydrodissection and hydrodelineation were carried out with BSS on a blunt cannula. The lens was removed in a divide and conquer  technique and the remaining cortical material was removed with the irrigation-aspiration handpiece. The capsular bag  was inflated with viscoelastic and the lens was placed in the capsular bag without complication. The remaining viscoelastic was removed from the eye with the irrigation-aspiration handpiece. The wounds were hydrated. The anterior chamber was flushed and the eye was inflated to physiologic pressure. 0.90ml Vigamox was placed in the anterior chamber. The wounds were found to be water tight. The eye was dressed with Vigamox. The patient was given protective glasses to wear throughout the day and a shield with which to sleep tonight. The patient was also given drops with which to begin a drop regimen today and will follow-up with me in one day. Implant Name Type Inv. Item Serial No. Manufacturer Lot No. LRB No. Used Action  Marko Stai - [id number] Stent Marko Stai [id number] [employer]  Right 1 Implanted  LENS IOL DIOP 22.5 - [id number] Intraocular Lens LENS IOL DIOP 22.5 [id number] JOHNSON   Right 1 Implanted    Procedure(s): CATARACT EXTRACTION PHACO AND INTRAOCULAR LENS PLACEMENT (IOC) RIGHT ISTENT INJ VISION BLUE 4.84  00:29.1 (Right)  Electronically signed: Giuseppe Duchemin 04/14/2020 10:14 AM

## 2020-04-14 NOTE — Anesthesia Postprocedure Evaluation (Signed)
Anesthesia Post Note  Patient: Christopher Hickman  Procedure(s) Performed: CATARACT EXTRACTION PHACO AND INTRAOCULAR LENS PLACEMENT (IOC) RIGHT ISTENT INJ VISION BLUE 4.84  00:29.1 (Right Eye)     Patient location during evaluation: PACU Anesthesia Type: MAC Level of consciousness: awake and alert Pain management: pain level controlled Vital Signs Assessment: post-procedure vital signs reviewed and stable Respiratory status: spontaneous breathing, nonlabored ventilation, respiratory function stable and patient connected to nasal cannula oxygen Cardiovascular status: stable and blood pressure returned to baseline Postop Assessment: no apparent nausea or vomiting Anesthetic complications: no   No complications documented.  Adele Barthel Matalie Romberger

## 2020-04-14 NOTE — H&P (Signed)
   I have reviewed the patient's H&P and agree with its findings. There have been no interval changes.  Darivs Lunden MD Ophthalmology 

## 2020-04-14 NOTE — Anesthesia Preprocedure Evaluation (Addendum)
Anesthesia Evaluation    Airway Mallampati: II  TM Distance: >3 FB   Mouth opening: Pediatric Airway  Dental no notable dental hx.    Pulmonary former smoker,    Pulmonary exam normal        Cardiovascular hypertension, Pt. on medications Normal cardiovascular exam     Neuro/Psych    GI/Hepatic   Endo/Other    Renal/GU Renal disease (kidney transplant 2019 2/2 [facility])     Musculoskeletal   Abdominal   Peds  Hematology   Anesthesia Other Findings   Reproductive/Obstetrics                            Anesthesia Physical Anesthesia Plan  ASA: III  Anesthesia Plan: MAC   Post-op Pain Management:    Induction: Intravenous  PONV Risk Score and Plan: 1 and TIVA, Midazolam and Treatment may vary due to age or medical condition  Airway Management Planned: Nasal Cannula and Natural Airway  Additional Equipment: None  Intra-op Plan:   Post-operative Plan:   Informed Consent: I have reviewed the patients History and Physical, chart, labs and discussed the procedure including the risks, benefits and alternatives for the proposed anesthesia with the patient or authorized representative who has indicated his/her understanding and acceptance.       Plan Discussed with: CRNA  Anesthesia Plan Comments: (Interpreter number: [id number])       Anesthesia Quick Evaluation

## 2020-04-14 NOTE — Anesthesia Procedure Notes (Signed)
Procedure Name: MAC Performed by: Sims Laday, CRNA Pre-anesthesia Checklist: Patient identified, Emergency Drugs available, Suction available, Timeout performed and Patient being monitored Patient Re-evaluated:Patient Re-evaluated prior to induction Oxygen Delivery Method: Nasal cannula Placement Confirmation: positive ETCO2       

## 2020-04-14 NOTE — Transfer of Care (Signed)
Immediate Anesthesia Transfer of Care Note  Patient: Christopher Hickman  Procedure(s) Performed: CATARACT EXTRACTION PHACO AND INTRAOCULAR LENS PLACEMENT (IOC) RIGHT ISTENT INJ VISION BLUE 4.84  00:29.1 (Right Eye)  Patient Location: PACU  Anesthesia Type: MAC  Level of Consciousness: awake, alert  and patient cooperative  Airway and Oxygen Therapy: Patient Spontanous Breathing and Patient connected to supplemental oxygen  Post-op Assessment: Post-op Vital signs reviewed, Patient's Cardiovascular Status Stable, Respiratory Function Stable, Patent Airway and No signs of Nausea or vomiting  Post-op Vital Signs: Reviewed and stable  Complications: No complications documented.

## 2020-04-18 ENCOUNTER — Encounter: Payer: Self-pay | Admitting: Ophthalmology

## 2020-04-18 NOTE — Unmapped (Signed)
The [facility] has made a second and final attempt to reach this patient to refill the following medication: all meds.      We have left voicemails on the following phone numbers: [phone].    Dates contacted: 10/05 & 10/11  Last scheduled delivery: 03/24/2020    The patient may be at risk of non-compliance with this medication. The patient should call the [facility] at [phone] (option 4) to refill medication.    Oretha Milch   [facility] Specialty Technician

## 2020-04-18 NOTE — Unmapped (Signed)
[facility] Refill Coordination Note    Specialty Medication(s) to be Shipped:   Transplant: Myfortic 180mg  and tacrolimus 1mg     Other medication(s) to be shipped: atorvastatin and lisinopril     [facility], DOB: 11/15/1955  Phone: [phone] (home)       All above HIPAA information was verified with patient.     Was a Nurse, learning disability used for this call? No    Completed refill call assessment today to schedule patient's medication shipment from the [facility] [phone]).       Specialty medication(s) and dose(s) confirmed: Regimen is correct and unchanged.   Changes to medications: [name] reports no changes at this time.  Changes to insurance: No  Questions for the pharmacist: No    Confirmed patient received Welcome Packet with first shipment. The patient will receive a drug information handout for each medication shipped and additional FDA Medication Guides as required.       DISEASE/MEDICATION-SPECIFIC INFORMATION        N/A    SPECIALTY MEDICATION ADHERENCE     Medication Adherence    Patient reported X missed doses in the last month: 0  Specialty Medication: myfortic 180mg   Patient is on additional specialty medications: Yes  Additional Specialty Medications: Tacrolimus 1mg   Patient Reported Additional Medication X Missed Doses in the Last Month: 0  Patient is on more than two specialty medications: No  Adherence tools used: patient uses a pill box to manage medications  Support network for adherence: family member        Tacrolimus 1 mg: 4 days of medicine on hand   Myfortic 180 mg: 4 days of medicine on hand     SHIPPING     Shipping address confirmed in Epic.     Delivery Scheduled: Yes, Expected medication delivery date: 04/21/2020.     Medication will be delivered via Next Day Courier to the prescription address in Epic [facility].    Justin Mcknight   [facility] Specialty Technician

## 2020-04-20 MED FILL — TACROLIMUS 1 MG CAPSULE, IMMEDIATE-RELEASE: ORAL | 30 days supply | Qty: 120 | Fill #1

## 2020-04-20 MED FILL — ATORVASTATIN 10 MG TABLET: ORAL | 30 days supply | Qty: 30 | Fill #2

## 2020-04-20 MED FILL — LISINOPRIL 20 MG TABLET: 30 days supply | Qty: 30 | Fill #3 | Status: AC

## 2020-04-20 MED FILL — TACROLIMUS 1 MG CAPSULE, IMMEDIATE-RELEASE: 30 days supply | Qty: 120 | Fill #1 | Status: AC

## 2020-04-20 MED FILL — ATORVASTATIN 10 MG TABLET: 30 days supply | Qty: 30 | Fill #2 | Status: AC

## 2020-04-20 MED FILL — LISINOPRIL 20 MG TABLET: ORAL | 30 days supply | Qty: 30 | Fill #3

## 2020-04-20 MED FILL — MYFORTIC 180 MG TABLET,DELAYED RELEASE: 30 days supply | Qty: 180 | Fill #7 | Status: AC

## 2020-04-20 MED FILL — MYFORTIC 180 MG TABLET,DELAYED RELEASE: ORAL | 30 days supply | Qty: 180 | Fill #7

## 2020-05-03 DIAGNOSIS — Z940 Kidney transplant status: Principal | ICD-10-CM

## 2020-05-03 DIAGNOSIS — N186 End stage renal disease: Principal | ICD-10-CM

## 2020-05-04 ENCOUNTER — Ambulatory Visit: Admit: 2020-05-04 | Discharge: 2020-05-04 | Payer: MEDICARE

## 2020-05-04 ENCOUNTER — Ambulatory Visit: Admit: 2020-05-04 | Discharge: 2020-05-04 | Payer: MEDICARE | Attending: Nephrology | Primary: Nephrology

## 2020-05-04 DIAGNOSIS — Z940 Kidney transplant status: Principal | ICD-10-CM

## 2020-05-04 DIAGNOSIS — N186 End stage renal disease: Principal | ICD-10-CM

## 2020-05-04 LAB — CBC W/ AUTO DIFF
BASOPHILS ABSOLUTE COUNT: 0 10*9/L (ref 0.0–0.1)
BASOPHILS RELATIVE PERCENT: 0.8 %
EOSINOPHILS ABSOLUTE COUNT: 0.1 10*9/L (ref 0.0–0.7)
EOSINOPHILS RELATIVE PERCENT: 2.3 %
HEMATOCRIT: 43.6 % (ref 38.0–50.0)
HEMOGLOBIN: 14.1 g/dL (ref 13.5–17.5)
LYMPHOCYTES ABSOLUTE COUNT: 0.9 10*9/L (ref 0.7–4.0)
LYMPHOCYTES RELATIVE PERCENT: 15.7 %
MEAN CORPUSCULAR HEMOGLOBIN CONC: 32.4 g/dL (ref 30.0–36.0)
MEAN CORPUSCULAR HEMOGLOBIN: 30.2 pg (ref 26.0–34.0)
MEAN PLATELET VOLUME: 9 fL (ref 7.0–10.0)
MONOCYTES RELATIVE PERCENT: 9.6 %
NEUTROPHILS ABSOLUTE COUNT: 4.3 10*9/L (ref 1.7–7.7)
NEUTROPHILS RELATIVE PERCENT: 71.6 %
PLATELET COUNT: 211 10*9/L (ref 150–450)
RED BLOOD CELL COUNT: 4.67 10*12/L (ref 4.32–5.72)
WBC ADJUSTED: 6 10*9/L (ref 3.5–10.5)

## 2020-05-04 LAB — PH UA: pH:LsCnc:Pt:Urine:Qn:Test strip: 5.5

## 2020-05-04 LAB — URINALYSIS
BILIRUBIN UA: NEGATIVE
GLUCOSE UA: NEGATIVE
KETONES UA: NEGATIVE
LEUKOCYTE ESTERASE UA: NEGATIVE
NITRITE UA: NEGATIVE
PH UA: 5.5 (ref 5.0–9.0)
PROTEIN UA: NEGATIVE
RBC UA: 2 /HPF (ref <3)
SPECIFIC GRAVITY UA: 1.025 (ref 1.005–1.030)
SQUAMOUS EPITHELIAL: <1 /HPF (ref 0–5)
UROBILINOGEN UA: 0.2
WBC UA: <1 /HPF (ref <2)

## 2020-05-04 LAB — PLATELET COUNT: Platelets:NCnc:Pt:Bld:Qn:Automated count: 211

## 2020-05-04 LAB — MAGNESIUM: Magnesium:MCnc:Pt:Ser/Plas:Qn:: 1.9

## 2020-05-04 LAB — BASIC METABOLIC PANEL
ANION GAP: 4 mmol/L — ABNORMAL LOW (ref 5–14)
BLOOD UREA NITROGEN: 34 mg/dL — ABNORMAL HIGH (ref 9–23)
BUN / CREAT RATIO: 20
CALCIUM: 10.3 mg/dL (ref 8.7–10.4)
CHLORIDE: 108 mmol/L — ABNORMAL HIGH (ref 98–107)
CREATININE: 1.69 mg/dL — ABNORMAL HIGH
EGFR CKD-EPI AA MALE: 49 mL/min/{1.73_m2} — ABNORMAL LOW (ref >=60)
EGFR CKD-EPI NON-AA MALE: 42 mL/min/{1.73_m2} — ABNORMAL LOW (ref >=60)
GLUCOSE RANDOM: 111 mg/dL (ref 70–179)
POTASSIUM: 4.9 mmol/L — ABNORMAL HIGH (ref 3.4–4.5)
SODIUM: 138 mmol/L (ref 135–145)

## 2020-05-04 LAB — SODIUM: Sodium:SCnc:Pt:Ser/Plas:Qn:: 138

## 2020-05-04 LAB — PROTEIN / CREATININE RATIO, URINE: CREATININE, URINE: 146.2 mg/dL

## 2020-05-04 LAB — PHOSPHORUS: Phosphate:MCnc:Pt:Ser/Plas:Qn:: 2.8

## 2020-05-04 LAB — PROTEIN URINE: Protein:MCnc:Pt:Urine:Qn:: 30.3

## 2020-05-04 MED ORDER — FAMOTIDINE 20 MG TABLET
ORAL_TABLET | Freq: Every evening | ORAL | 2 refills | 30 days | Status: CP | PRN
Start: 2020-05-04 — End: 2021-05-04

## 2020-05-04 NOTE — Unmapped (Signed)
AOBP   Patient's blood pressure was taken on right upper arm, medium cuff.   1st reading 115/85, pulse: 77  2nd reading 117/80, pulse: 75  3rd reading 118/86, pulse: 78  Average reading 117/84, pulse: 77

## 2020-05-04 NOTE — Unmapped (Signed)
Saw patient in clinic. Reports he is doing well. Recently had cataract and implant to both eyes. Still having some issues with vision and next apt in next week.    Denies HA, CP, heart palpitations, dizziness, v/d/c, urinary problems, swelling, numbness/tingling, tremors, or fevers    +intermittent foam in his urine     BP avg 120/80    Reports nausea doesn't go away until he eats.    Will get flu shot today, needs 3rd shot today    Needs labs today, took prograf last night at 7pm

## 2020-05-05 LAB — TACROLIMUS LEVEL, TROUGH: TACROLIMUS, TROUGH: 4.9 ng/mL — ABNORMAL LOW (ref 5.0–15.0)

## 2020-05-06 LAB — CMV DNA, QUANTITATIVE, PCR: CMV VIRAL LD: NOT DETECTED

## 2020-05-09 NOTE — Unmapped (Signed)
Patient left VM in spanish    Called patient with pacific interpreter Shaune Pascal [phone]    Patient thought that I tried to call him and he was concerned what I wanted    Let patient know that I did not call him and he reports he is doing well. Went over recent labs with patient. Denies any needs

## 2020-05-10 NOTE — Unmapped (Signed)
[facility] Clinical Assessment & Refill Coordination Note    Justin Mcknight [place], [place]: 1956/04/15  Phone: [phone] (home)     All above HIPAA information was verified with patient.     Was a Nurse, learning disability used for this call? Yes, Reynaldo. Patient language is appropriate in [facility]    Specialty Medication(s):   Transplant: Myfortic 180mg  and tacrolimus 1mg      Current Outpatient Medications   Medication Sig Dispense Refill   ??? atorvastatin (LIPITOR) 10 MG tablet Take 1 tablet (10 mg total) by mouth daily. 30 tablet 11   ??? brimonidine-timoloL (COMBIGAN) 0.2-0.5 % ophthalmic solution Administer 1 drop to both eyes every twelve (12) hours.     ??? famotidine (PEPCID) 20 MG tablet Take 1 tablet (20 mg total) by mouth nightly as needed for heartburn. 30 tablet 2   ??? latanoprost (XALATAN) 0.005 % ophthalmic solution Administer 1 drop to both eyes nightly.     ??? lisinopriL (PRINIVIL,ZESTRIL) 20 MG tablet Take 1 tablet (20 mg total) by mouth every evening. 30 tablet 11   ??? MYFORTIC 180 mg EC tablet Take 3 tablets (540mg ) by mouth 2 times daily 180 tablet 11   ??? tacrolimus (PROGRAF) 1 MG capsule Take 2 capsules (2 mg total) by mouth two (2) times a day. 120 capsule 11     No current facility-administered medications for this visit.        Changes to medications: [name] reports no changes at this time.    Allergies   Allergen Reactions   ??? Codeine Swelling       Changes to allergies: No    SPECIALTY MEDICATION ADHERENCE     Tacrolimus 1 mg: 12 days of medicine on hand   Myfortic 180 mg: 12 days of medicine on hand       Medication Adherence    Patient reported X missed doses in the last month: 0  Specialty Medication: Myfortic 180mg   Patient is on additional specialty medications: Yes  Additional Specialty Medications: Tacrolimus 1mg   Patient Reported Additional Medication X Missed Doses in the Last Month: 0  Patient is on more than two specialty medications: No  Adherence tools used: patient uses a pill box to manage medications  Support network for adherence: family member          Specialty medication(s) dose(s) confirmed: Regimen is correct and unchanged.     Are there any concerns with adherence? No    Adherence counseling provided? Not needed    CLINICAL MANAGEMENT AND INTERVENTION      Clinical Benefit Assessment:    Do you feel the medicine is effective or helping your condition? Yes    Clinical Benefit counseling provided? Not needed    Adverse Effects Assessment:    Are you experiencing any side effects? No    Are you experiencing difficulty administering your medicine? No    Quality of Life Assessment:    How many days over the past month did your kidney transplant  keep you from your normal activities? For example, brushing your teeth or getting up in the morning. 0    Have you discussed this with your provider? Not needed    Therapy Appropriateness:    Is therapy appropriate? Yes, therapy is appropriate and should be continued    DISEASE/MEDICATION-SPECIFIC INFORMATION      N/A    PATIENT SPECIFIC NEEDS     - Does the patient have any physical, cognitive, or cultural barriers? No    -  Is the patient high risk? Yes, patient is taking a REMS drug. Medication is dispensed in compliance with REMS program    - Does the patient require a Care Management Plan? No     - Does the patient require physician intervention or other additional services (i.e. nutrition, smoking cessation, social work)? No      SHIPPING     Specialty Medication(s) to be Shipped:   Transplant: Myfortic 180mg  and tacrolimus 1mg     Other medication(s) to be shipped: lisinopril, atorvastatin     Changes to insurance: No    Delivery Scheduled: Yes, Expected medication delivery date: 05/18/20.     Medication will be delivered via Same Day Courier to the confirmed prescription address in [facility].    The patient will receive a drug information handout for each medication shipped and additional FDA Medication Guides as required. Verified that patient has previously received a Conservation officer, historic buildings.    All of the patient's questions and concerns have been addressed.    Tera Helper   [facility] Specialty Pharmacist

## 2020-05-13 DIAGNOSIS — Z940 Kidney transplant status: Principal | ICD-10-CM

## 2020-05-13 DIAGNOSIS — R7989 Other specified abnormal findings of blood chemistry: Principal | ICD-10-CM

## 2020-05-13 NOTE — Unmapped (Signed)
[facility] transplant nephrology clinic visit    assessment and plan  1. s/p kidney transplant 09/03/2017. baseline creatinine 1.6-1.8 mg/dl. stage iiib chronic kidney disease. no proteinuria. no donor specific hla ab detected '21  2. immunosuppression. mycophenolate sodium 540mg  bid. tacrolimus 12hr lvl 5-7 ng/ml.   3. hypertension. blood pressure goal < 130/80 mmhg.  4. hyperlipidemia. atorvastatin 10mg  daily.  5. gastroesophageal reflux disease. +famotidine 20mg  q.pm.  6. preventive medicine. influenza '20. pcv13 pneumococcal '20. ppsv23 pneumococcal '21. zoster '20. [phone] pfizer vaccine '21 with 3rd dose recommended; [name] plans to receive this and influenza vaccine later this week when he does not have to work. colonoscopy '20 with 7 year follow up recommended. kidney ultrasound '21.    history of present illness    [name] is a 64 year old gentleman seen in follow up post kidney transplant 09/03/2017. he is seen with assistance of [place] spanish interpreter. he feels well. +bilateral cataract resection in past 81m. no fever chills or sweats. no headache or lightheaded. no chest pain cough or shortness of breath. no lower extremity edema. appetite nl. +mild nausea/reflux symptoms in am which improve with eating. no vomiting or diarrhea. no dysuria hematuria or difficulty voiding. all other systems reviewed and negative x10 systems.    past medical hx:  1. 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 hypertension. alemtuzumab induction. baseline creatinine 1.6-1.8 mg/dl.  > kidney bx 03/19: no acute rejection. +mild focal thrombotic microangiopathy.  > kidney bx 05/19: no acute rejection. no thrombotic microangiopathy.  2. hypertension  3. hyperlipidemia  4. hx renal cell carcinoma s/p right nephrectomy '12.    past surgical hx: left forearm av fistula '12. right nephrectomy '12. kidney transplant '19. left forearm av fistula ligation '19. bilateral cataract resection '21.    allergies: codeine    medications: tacrolimus 2mg  bid, mycophenolate sodium 540mg  bid, lisinopril 20mg  q.pm, atorvastatin 10mg  daily.    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    physical exam: t96 p77 bp117/84 wt87.5kg bmi 26.2. wd/wn gentleman appropriate affect and mood. sclera anicteric. mmm no thrush. neck supple no palpable ln. heart rrr nl s1s2 no m/r/g. lungs clear bilateral. abd soft nt/nd. no lower ext edema. msk no synovitis or tophi. skin no rash. neuro alert oriented non focal exam.    labs 05/04/20: wbc6.0 hgb14.1 hct43.6 plts211. AV409 k4.9 cl108 bicarb26 bun34 cr1.7 glc111 ca10.3 mg1.9 phos2.8. cmv pcr not detected. tacrolimus 12+hr lvl 4.9 ng/ml. urine protein/cr 0.207.

## 2020-05-18 MED FILL — MYFORTIC 180 MG TABLET,DELAYED RELEASE: 30 days supply | Qty: 180 | Fill #8 | Status: AC

## 2020-05-18 MED FILL — LISINOPRIL 20 MG TABLET: 30 days supply | Qty: 30 | Fill #4 | Status: AC

## 2020-05-18 MED FILL — MYFORTIC 180 MG TABLET,DELAYED RELEASE: ORAL | 30 days supply | Qty: 180 | Fill #8

## 2020-05-18 MED FILL — LISINOPRIL 20 MG TABLET: ORAL | 30 days supply | Qty: 30 | Fill #4

## 2020-05-18 MED FILL — TACROLIMUS 1 MG CAPSULE, IMMEDIATE-RELEASE: 30 days supply | Qty: 120 | Fill #2 | Status: AC

## 2020-05-18 MED FILL — TACROLIMUS 1 MG CAPSULE, IMMEDIATE-RELEASE: ORAL | 30 days supply | Qty: 120 | Fill #2

## 2020-05-18 MED FILL — ATORVASTATIN 10 MG TABLET: ORAL | 30 days supply | Qty: 30 | Fill #3

## 2020-05-18 MED FILL — ATORVASTATIN 10 MG TABLET: 30 days supply | Qty: 30 | Fill #3 | Status: AC

## 2020-06-05 ENCOUNTER — Emergency Department
Admission: EM | Admit: 2020-06-05 | Discharge: 2020-06-05 | Disposition: A | Discharge status: Home / Self Care | Payer: Medicare Other | Attending: Emergency Medicine | Admitting: Emergency Medicine

## 2020-06-05 ENCOUNTER — Emergency Department: Payer: Medicare Other

## 2020-06-05 ENCOUNTER — Encounter: Payer: Self-pay | Admitting: Emergency Medicine

## 2020-06-05 ENCOUNTER — Other Ambulatory Visit: Payer: Self-pay

## 2020-06-05 DIAGNOSIS — I120 Hypertensive chronic kidney disease with stage 5 chronic kidney disease or end stage renal disease: Secondary | ICD-10-CM | POA: Insufficient documentation

## 2020-06-05 DIAGNOSIS — N186 End stage renal disease: Secondary | ICD-10-CM | POA: Insufficient documentation

## 2020-06-05 DIAGNOSIS — Z87891 Personal history of nicotine dependence: Secondary | ICD-10-CM | POA: Insufficient documentation

## 2020-06-05 DIAGNOSIS — B349 Viral infection, unspecified: Secondary | ICD-10-CM | POA: Diagnosis not present

## 2020-06-05 DIAGNOSIS — R6883 Chills (without fever): Secondary | ICD-10-CM | POA: Diagnosis present

## 2020-06-05 DIAGNOSIS — Z992 Dependence on renal dialysis: Secondary | ICD-10-CM | POA: Diagnosis not present

## 2020-06-05 DIAGNOSIS — U071 COVID-19: Secondary | ICD-10-CM | POA: Insufficient documentation

## 2020-06-05 DIAGNOSIS — N1832 Chronic kidney disease, stage 3b: Secondary | ICD-10-CM

## 2020-06-05 DIAGNOSIS — E860 Dehydration: Secondary | ICD-10-CM | POA: Insufficient documentation

## 2020-06-05 DIAGNOSIS — Z79899 Other long term (current) drug therapy: Secondary | ICD-10-CM | POA: Insufficient documentation

## 2020-06-05 DIAGNOSIS — Z940 Kidney transplant status: Secondary | ICD-10-CM | POA: Diagnosis not present

## 2020-06-05 LAB — URINALYSIS, COMPLETE (UACMP) WITH MICROSCOPIC
Bilirubin Urine: NEGATIVE
Glucose, UA: NEGATIVE mg/dL
Ketones, ur: NEGATIVE mg/dL
Leukocytes,Ua: NEGATIVE
Nitrite: NEGATIVE
Protein, ur: 30 mg/dL — AB
Specific Gravity, Urine: 1.021 (ref 1.005–1.030)
pH: 5 (ref 5.0–8.0)

## 2020-06-05 LAB — CBC WITH DIFFERENTIAL/PLATELET
Abs Immature Granulocytes: 0.03 10*3/uL (ref 0.00–0.07)
Basophils Absolute: 0 10*3/uL (ref 0.0–0.1)
Basophils Relative: 0 %
Eosinophils Absolute: 0 10*3/uL (ref 0.0–0.5)
Eosinophils Relative: 0 %
HCT: 41.9 % (ref 39.0–52.0)
Hemoglobin: 13.7 g/dL (ref 13.0–17.0)
Immature Granulocytes: 1 %
Lymphocytes Relative: 12 %
Lymphs Abs: 0.7 10*3/uL (ref 0.7–4.0)
MCH: 30.6 pg (ref 26.0–34.0)
MCHC: 32.7 g/dL (ref 30.0–36.0)
MCV: 93.5 fL (ref 80.0–100.0)
Monocytes Absolute: 0.4 10*3/uL (ref 0.1–1.0)
Monocytes Relative: 7 %
Neutro Abs: 4.8 10*3/uL (ref 1.7–7.7)
Neutrophils Relative %: 80 %
Platelets: 145 10*3/uL — ABNORMAL LOW (ref 150–400)
RBC: 4.48 MIL/uL (ref 4.22–5.81)
RDW: 12.9 % (ref 11.5–15.5)
WBC: 6 10*3/uL (ref 4.0–10.5)
nRBC: 0 % (ref 0.0–0.2)

## 2020-06-05 LAB — COMPREHENSIVE METABOLIC PANEL
ALT: 35 U/L (ref 0–44)
AST: 35 U/L (ref 15–41)
Albumin: 4.1 g/dL (ref 3.5–5.0)
Alkaline Phosphatase: 42 U/L (ref 38–126)
Anion gap: 8 (ref 5–15)
BUN: 33 mg/dL — ABNORMAL HIGH (ref 8–23)
CO2: 23 mmol/L (ref 22–32)
Calcium: 9 mg/dL (ref 8.9–10.3)
Chloride: 101 mmol/L (ref 98–111)
Creatinine, Ser: 2.08 mg/dL — ABNORMAL HIGH (ref 0.61–1.24)
GFR, Estimated: 35 mL/min — ABNORMAL LOW (ref >60)
Glucose, Bld: 147 mg/dL — ABNORMAL HIGH (ref 70–99)
Potassium: 4.3 mmol/L (ref 3.5–5.1)
Sodium: 132 mmol/L — ABNORMAL LOW (ref 135–145)
Total Bilirubin: 0.4 mg/dL (ref 0.3–1.2)
Total Protein: 7.4 g/dL (ref 6.5–8.1)

## 2020-06-05 LAB — RESP PANEL BY RT-PCR (FLU A&B, COVID) ARPGX2
Influenza A by PCR: NEGATIVE
Influenza B by PCR: NEGATIVE
SARS Coronavirus 2 by RT PCR: POSITIVE — AB

## 2020-06-05 LAB — LACTIC ACID, PLASMA: Lactic Acid, Venous: 0.9 mmol/L (ref 0.5–1.9)

## 2020-06-05 LAB — LIPASE, BLOOD: Lipase: 54 U/L — ABNORMAL HIGH (ref 11–51)

## 2020-06-05 MED ORDER — LOPERAMIDE HCL 2 MG PO TABS: 4.0000 mg | ORAL_TABLET | Freq: Four times a day (QID) | ORAL | 0 refills | Status: AC | PRN

## 2020-06-05 MED ORDER — ONDANSETRON 4 MG PO TBDP: 4.0000 mg | ORAL_TABLET | Freq: Three times a day (TID) | ORAL | 0 refills | Status: AC | PRN

## 2020-06-05 MED ORDER — LACTATED RINGERS IV BOLUS
1000.0000 mL | Freq: Once | INTRAVENOUS | Status: AC
  Administered 2020-06-05: 1000 mL via INTRAVENOUS

## 2020-06-05 MED ORDER — ACETAMINOPHEN 325 MG PO TABS: 650.0000 mg | ORAL_TABLET | Freq: Four times a day (QID) | ORAL | 0 refills | Status: AC | PRN

## 2020-06-05 MED ORDER — LOPERAMIDE HCL 2 MG PO CAPS
4.0000 mg | ORAL_CAPSULE | Freq: Once | ORAL | Status: AC
  Administered 2020-06-05: 4 mg via ORAL
  Filled 2020-06-05: qty 2

## 2020-06-05 MED ORDER — AMOXICILLIN-POT CLAVULANATE 875-125 MG PO TABS: 1.0000 | ORAL_TABLET | Freq: Two times a day (BID) | ORAL | 0 refills | Status: AC

## 2020-06-05 MED ORDER — ONDANSETRON HCL 4 MG/2ML IJ SOLN
4.0000 mg | Freq: Once | INTRAMUSCULAR | Status: AC
  Administered 2020-06-05: 4 mg via INTRAVENOUS
  Filled 2020-06-05: qty 2

## 2020-06-05 NOTE — ED Triage Notes (Addendum)
Assessment via Spanish Interpreter:  Pt arrived via POV with reports of chills since Thursday, cough that is productive and subjective fever as well as HA.  Pt states every time he coughs his head hurts.  Pt also states he has no taste but is able to smell things and reports diarrhea started today.  Denies any COVID contacts. Reports he has had 3 COVID shots.  Pt reports he has had kidney transplant in Feb 2019 at [facility].  Currently on immunosuppressants.  Pt has been taking Tylenol at home, none today.

## 2020-06-05 NOTE — ED Provider Notes (Addendum)
[facility] Emergency Department Provider Note  ____________________________________________  Time seen: Approximately 1:33 PM  I have reviewed the triage vital signs and the nursing notes.   HISTORY  Chief Complaint Cough and Chills  Patient encounter completed with Spanish video interpreter  HPI Christopher Hickman is a 64 y.o. male with a history of stage IIIb CKD, renal transplant from 2019, hypertension who comes the ED complaining of multiple symptoms including chills, loss of sense of taste, body aches, fatigue, nonproductive cough for the past 3 days, associate with some intermittent mild bilateral frontal headaches and some diarrhea that started today.  No known sick contacts, works at [employer].  Has had 3 Covid vaccines.  He is compliant with his Prograf and Myfortic immunosuppression.  No vomiting.     Past Medical History:  Diagnosis Date  . Anemia of chronic kidney failure    HX of  . End stage renal disease ([place])   . Glaucoma   . Gout   . History of shingles 2020  . Hypercholesteremia   . Hypertension   . Kidney transplanted 2019   right  . Right renal mass      Patient Active Problem List   Diagnosis Date Noted  . Essential hypertension 08/12/2018  . Renal transplant, status post 08/12/2018  . Arm mass, left 08/12/2018  . GIB (gastrointestinal bleeding) 07/23/2017  . Chest pain 08/28/2016  . Hypertensive urgency 08/28/2016  . Dietary indiscretion 08/28/2016  . ESRD (end stage renal disease) on dialysis ([place]) 08/28/2016  . NICM (nonischemic cardiomyopathy) ([place]) 08/28/2016     Past Surgical History:  Procedure Laterality Date  . A/V FISTULAGRAM Left 07/26/2017   Procedure: A/V FISTULAGRAM;  Surgeon: Katha Cabal, MD;  Location: [facility];  Service: Cardiovascular;  Laterality: Left;  . CARDIAC CATHETERIZATION  07/2004   [facility]; ef60%  . CATARACT EXTRACTION W/PHACO Left 03/10/2020   Procedure: CATARACT  EXTRACTION PHACO AND INTRAOCULAR LENS PLACEMENT (IOC) LEFT ISTENT INJ W VISION BLUE 7.56 00:39.0;  Surgeon: Marchia Meiers, MD;  Location: [place];  Service: Ophthalmology;  Laterality: Left;  . CATARACT EXTRACTION W/PHACO Right 04/14/2020   Procedure: CATARACT EXTRACTION PHACO AND INTRAOCULAR LENS PLACEMENT ([place]) RIGHT ISTENT INJ VISION BLUE 4.84  00:29.1;  Surgeon: Marchia Meiers, MD;  Location: [place] [facility];  Service: Ophthalmology;  Laterality: Right;  . KIDNEY TRANSPLANT     right   . left arm av graft    . NEPHRECTOMY    . UPPER EXTREMITY ANGIOGRAPHY Left 08/21/2018   Procedure: UPPER EXTREMITY ANGIOGRAPHY;  Surgeon: [name], MD;  Location: [place];  Service: Cardiovascular;  Laterality: Left;     Prior to Admission medications   Medication Sig Start Date End Date Taking? Authorizing Provider  acetaminophen (TYLENOL) 325 MG tablet Take 2 tablets (650 mg total) by mouth every 6 (six) hours as needed. 06/05/20   Carrie Mew, MD  acetaminophen (TYLENOL) 500 MG tablet Take 500 mg by mouth every 6 (six) hours as needed (for pain.).    [provider]  amoxicillin-clavulanate (AUGMENTIN) 875-125 MG tablet Take 1 tablet by mouth 2 (two) times daily. 06/05/20   Carrie Mew, MD  atorvastatin (LIPITOR) 10 MG tablet Take 10 mg by mouth daily.  02/25/18 04/14/20  [provider]  brimonidine-timolol (COMBIGAN) 0.2-0.5 % ophthalmic solution Place 1 drop into both eyes every 12 (twelve) hours.    [provider]  latanoprost (XALATAN) 0.005 % ophthalmic solution 1 drop  at bedtime.    [provider]  lisinopril (PRINIVIL,ZESTRIL) 5 MG tablet Take 20 mg by mouth every evening.  05/28/18 04/14/20  [provider]  loperamide (IMODIUM A-D) 2 MG tablet Take 2 tablets (4 mg total) by mouth 4 (four) times daily as needed for diarrhea or loose stools. 06/05/20   Carrie Mew, MD  mycophenolate (MYFORTIC) 180 MG EC  tablet Take 540 mg by mouth 2 (two) times daily.    [provider]  ondansetron (ZOFRAN ODT) 4 MG disintegrating tablet Take 1 tablet (4 mg total) by mouth every 8 (eight) hours as needed for nausea or vomiting. 06/05/20   Carrie Mew, MD  tacrolimus (PROGRAF) 1 MG capsule Take 3 mg by mouth 2 (two) times daily.  07/22/18   [provider]     Allergies Codeine   Family History  Problem Relation Age of Onset  . Hypertension Mother     Social History Social History   Tobacco Use  . Smoking status: Former Smoker    Start date: 2018  . Smokeless tobacco: Never Used  . Tobacco comment: occasional  Vaping Use  . Vaping Use: Never used  Substance Use Topics  . Alcohol use: No  . Drug use: No    Review of Systems  Constitutional:   No fever positive chills.  ENT:   No sore throat. No rhinorrhea. Cardiovascular:   No chest pain or syncope. Respiratory:   No dyspnea positive cough. Gastrointestinal: Positive for epigastric pain.  No vomiting.  Positive diarrhea Musculoskeletal:   Negative for focal pain or swelling All other systems reviewed and are negative except as documented above in ROS and HPI.  ____________________________________________   PHYSICAL EXAM:  VITAL SIGNS: ED Triage Vitals  Enc Vitals Group     BP 06/05/20 1211 114/74     Pulse Rate 06/05/20 1211 (!) 124     Resp 06/05/20 1211 18     Temp 06/05/20 1211 99 F (37.2 C)     Temp Source 06/05/20 1211 Oral     SpO2 06/05/20 1211 98 %     Weight 06/05/20 1219 194 lb (88 kg)     Height 06/05/20 1219 6' (1.829 m)     Head Circumference --      Peak Flow --      Pain Score 06/05/20 1218 2     Pain Loc --      Pain Edu? --      Excl. in [place]? --     Vital signs reviewed, nursing assessments reviewed.   Constitutional:   Alert and oriented. Non-toxic appearance. Eyes:   Conjunctivae are normal. EOMI. PERRL. ENT      Head:   Normocephalic and atraumatic.      Nose:    Normal.      Mouth/Throat:   Dry mucous membranes.      Neck:   No meningismus. Full ROM. Hematological/Lymphatic/Immunilogical:   No cervical lymphadenopathy. Cardiovascular:   RRR. Symmetric bilateral radial and DP pulses.  No murmurs. Cap refill less than 2 seconds. Respiratory:   Normal respiratory effort without tachypnea/retractions. Breath sounds are clear and equal bilaterally. No wheezes/rales/rhonchi. Gastrointestinal:   Soft with mild epigastric tenderness. Non distended. There is no CVA tenderness.  No rebound, rigidity, or guarding.  Musculoskeletal:   Normal range of motion in all extremities. No joint effusions.  No lower extremity tenderness.  No edema. Neurologic:   Normal speech and language.  Motor grossly intact. No acute  focal neurologic deficits are appreciated.  Skin:    Skin is warm, dry and intact. No rash noted.  No petechiae, purpura, or bullae.  ____________________________________________    LABS (pertinent positives/negatives) (all labs ordered are listed, but only abnormal results are displayed) Labs Reviewed  RESP PANEL BY RT-PCR (FLU A&B, COVID) ARPGX2 - Abnormal; Notable for the following components:      Result Value   SARS Coronavirus 2 by RT PCR POSITIVE (*)    All other components within normal limits  COMPREHENSIVE METABOLIC PANEL - Abnormal; Notable for the following components:   Sodium 132 (*)    Glucose, Bld 147 (*)    BUN 33 (*)    Creatinine, Ser 2.08 (*)    GFR, Estimated 35 (*)    All other components within normal limits  CBC WITH DIFFERENTIAL/PLATELET - Abnormal; Notable for the following components:   Platelets 145 (*)    All other components within normal limits  URINALYSIS, COMPLETE (UACMP) WITH MICROSCOPIC - Abnormal; Notable for the following components:   Color, Urine YELLOW (*)    APPearance HAZY (*)    Hgb urine dipstick SMALL (*)    Protein, ur 30 (*)    Bacteria, UA RARE (*)    All other components within normal  limits  LIPASE, BLOOD - Abnormal; Notable for the following components:   Lipase 54 (*)    All other components within normal limits  LACTIC ACID, PLASMA   ____________________________________________   EKG  Interpreted by me Sinus tachycardia rate 116, left axis, normal intervals.  Poor R wave progression.  Normal ST segments.  Voltage criteria for LVH in the high lateral leads with associated repolarization abnormality.  No ischemic changes.  ____________________________________________    RADIOLOGY  DG Chest 2 View  Result Date: 06/05/2020 CLINICAL DATA:  Cough, fever EXAM: CHEST - 2 VIEW COMPARISON:  08/28/2016 FINDINGS: The heart size and mediastinal contours are within normal limits. Streaky opacity within the right mid lung and left lung base. Small nodular opacity within the medial aspect of the right upper lobe. There is additional small 9 mm nodular density at the peripheral aspect of the right lower lobe, possibly nipple shadow. No pleural effusion or pneumothorax. The visualized skeletal structures are unremarkable. IMPRESSION: 1. Streaky opacity within the right mid lung and left lung base, which may represent atelectasis or atypical/viral infection. 2. Small nodular opacity within the medial aspect of the right upper lobe, which may represent an additional focus of infection or inflammation. 3. Additional 9 mm nodular density at the peripheral aspect of the right lower lobe, possibly nipple shadow. Electronically Signed   By: Davina Poke D.O.   On: 06/05/2020 13:12    ____________________________________________   PROCEDURES Procedures  ____________________________________________  DIFFERENTIAL DIAGNOSIS   Dehydration, electrolyte abnormality, AKI, Covid, bacterial pneumonia  CLINICAL IMPRESSION / ASSESSMENT AND PLAN / ED COURSE  Medications ordered in the ED: Medications  lactated ringers bolus 1,000 mL (1,000 mLs Intravenous New Bag/Given 06/05/20 1340)   ondansetron (ZOFRAN) injection 4 mg (4 mg Intravenous Given 06/05/20 1339)  loperamide (IMODIUM) capsule 4 mg (4 mg Oral Given 06/05/20 1341)    Pertinent labs & imaging results that were available during my care of the patient were reviewed by me and considered in my medical decision making (see chart for details).  Christopher Hickman was evaluated in Emergency Department on 06/05/2020 for the symptoms described in the history of present illness. He was evaluated in the context of the  global COVID-19 pandemic, which necessitated consideration that the patient might be at risk for infection with the SARS-CoV-2 virus that causes COVID-19. Institutional protocols and algorithms that pertain to the evaluation of patients at risk for COVID-19 are in a state of rapid change based on information released by regulatory bodies including the CDC and federal and state organizations. These policies and algorithms were followed during the patient's care in the ED.   Patient presents with multitude of symptoms highly likely to be a viral illness especially with rapid viral syndromes currently seen in the community and his workplace setting.  With his immunosuppression, chest x-ray was obtained and I viewed and interpreted the imaging myself which does appear to show a right midlung opacity.  Radiology report confirms this finding, suspicious for viral versus bacterial infiltrate.  Patient is overall nontoxic and well-appearing, labs are reassuring, creatinine is close to baseline of roughly 1.7.  I will give IV fluids for hydration, Imodium and Zofran for symptom relief.  Check a Covid test and refer for [facility] infusion if positive.  Plan to discharge with supportive care and a course of antibiotics for CAP out of caution with his immunosuppression.  Clinical Course as of Jun 05 1518  Sun Jun 05, 2020  1506 Covid positive.  Will refer to [facility] infusion team.  Resp Panel by RT-PCR (Flu A&B, Covid) Nasopharyngeal  Swab(!) [PS]    Clinical Course User Index [PS] Carrie Mew, MD    ----------------------------------------- 3:19 PM on 06/05/2020 -----------------------------------------  Result discussed with the patient with video interpreter, Covid precautions discussed, recommended he keep taking all his medications, await call from [facility] infusion team, supportive care at home.  He is tolerating oral intake and feeling well.  Tachycardia improved with a heart rate in the 90s.   ____________________________________________   FINAL CLINICAL IMPRESSION(S) / ED DIAGNOSES    Final diagnoses:  Acute viral syndrome  Dehydration  Renal transplant, status post  Stage 3b chronic kidney disease ([place])  COVID-19 virus infection     ED Discharge Orders         Ordered    acetaminophen (TYLENOL) 325 MG tablet  Every 6 hours PRN        06/05/20 1333    ondansetron (ZOFRAN ODT) 4 MG disintegrating tablet  Every 8 hours PRN        06/05/20 1333    loperamide (IMODIUM A-D) 2 MG tablet  4 times daily PRN        06/05/20 1333    amoxicillin-clavulanate (AUGMENTIN) 875-125 MG tablet  2 times daily        06/05/20 1509          Portions of this note were generated with dragon dictation software. Dictation errors may occur despite best attempts at proofreading.   Carrie Mew, MD 06/05/20 [place]    Carrie Mew, MD 06/05/20 1520

## 2020-06-06 ENCOUNTER — Telehealth (HOSPITAL_COMMUNITY): Payer: Self-pay | Admitting: Emergency Medicine

## 2020-06-06 ENCOUNTER — Telehealth (HOSPITAL_COMMUNITY): Payer: Self-pay | Admitting: Family

## 2020-06-06 DIAGNOSIS — U071 COVID-19: Secondary | ICD-10-CM

## 2020-06-06 NOTE — Telephone Encounter (Signed)
Called with [employer] interpretor Cori Razor to discuss with Frederich Cha about Covid symptoms and potential candidacy for the use of sotrovimab, a combination monoclonal antibody infusion for those with mild to moderate Covid symptoms and at a high risk of hospitalization.     Pt is qualified for this infusion at the infusion center due to co-morbid conditions and/or a member of an at-risk group,. Discussed his high risk factors. He states he is unable to drive to [facility] for infusion. Offered door-to-door transportation to pick him up at his home and return him. He stated he wanted to "stay in his element" and have the infusion at [facility] if possible. Called Sharyn Lull with [facility] and provided patient information.   Ellamarie Naeve,NP

## 2020-06-06 NOTE — Telephone Encounter (Signed)
Called Spanish Interpreter [phone]. Called pt [phone] and explained possible monoclonal antibody treatment. Sx started 11/24. Tested positive 11/28 at [facility]. Sx include chills, body aches, fever, and diarrhea. Qualifying risk factors kidney transplant and HTN. Pt interested in tx. Informed pt an APP will call back to possibly schedule an appointment.

## 2020-06-14 NOTE — Unmapped (Signed)
The [facility] has made a second and final attempt to reach this patient to refill the following medication: all meds.      We have left voicemails on the following phone numbers: [phone].    Dates contacted: 12/01 & 12/07  Last scheduled delivery: 05/19/2020    The patient may be at risk of non-compliance with this medication. The patient should call the [facility] at [phone] (option 4) to refill medication.    Oretha Milch   [facility] Specialty Technician

## 2020-06-22 NOTE — Unmapped (Signed)
Patient passed away

## 2020-06-22 NOTE — Unmapped (Signed)
Received phone call from [place] [employer] on 2020/07/01 that patient had passed away on 2020/06/22 at [facility] from COVID 19.  Patient with functioning kidney

## 2020-07-09 DEATH — deceased
